# Patient Record
Sex: Female | Born: 1965 | Race: White | Hispanic: No | Smoking: Current every day smoker
Health system: Southern US, Community
[De-identification: ages and names within clinical notes are randomized; demographics above are authoritative.]

## PROBLEM LIST (undated history)

## (undated) DIAGNOSIS — K259 Gastric ulcer, unspecified as acute or chronic, without hemorrhage or perforation: Secondary | ICD-10-CM

## (undated) DIAGNOSIS — K2970 Gastritis, unspecified, without bleeding: Secondary | ICD-10-CM

## (undated) DIAGNOSIS — F32A Depression, unspecified: Secondary | ICD-10-CM

## (undated) DIAGNOSIS — I10 Essential (primary) hypertension: Secondary | ICD-10-CM

## (undated) DIAGNOSIS — F329 Major depressive disorder, single episode, unspecified: Secondary | ICD-10-CM

## (undated) DIAGNOSIS — F419 Anxiety disorder, unspecified: Secondary | ICD-10-CM

## (undated) HISTORY — PX: CHOLECYSTECTOMY: SHX55

## (undated) HISTORY — PX: HEMORRHOID SURGERY: SHX153

## (undated) HISTORY — PX: ABDOMINAL HYSTERECTOMY: SHX81

---

## 2008-06-24 ENCOUNTER — Emergency Department (HOSPITAL_COMMUNITY): Admission: EM | Admit: 2008-06-24 | Discharge: 2008-06-24 | Payer: Self-pay | Admitting: Emergency Medicine

## 2009-10-19 ENCOUNTER — Emergency Department (HOSPITAL_COMMUNITY): Admission: EM | Admit: 2009-10-19 | Discharge: 2009-10-20 | Payer: Self-pay | Admitting: Emergency Medicine

## 2009-11-23 ENCOUNTER — Ambulatory Visit (HOSPITAL_COMMUNITY): Admission: RE | Admit: 2009-11-23 | Discharge: 2009-11-23 | Payer: Self-pay | Admitting: General Surgery

## 2010-02-04 ENCOUNTER — Emergency Department (HOSPITAL_COMMUNITY): Admission: EM | Admit: 2010-02-04 | Discharge: 2010-02-04 | Payer: Self-pay | Admitting: Emergency Medicine

## 2010-05-15 ENCOUNTER — Other Ambulatory Visit: Payer: Self-pay | Admitting: Emergency Medicine

## 2010-05-16 ENCOUNTER — Ambulatory Visit: Payer: Self-pay | Admitting: Psychiatry

## 2010-05-17 ENCOUNTER — Inpatient Hospital Stay (HOSPITAL_COMMUNITY)
Admission: AD | Admit: 2010-05-17 | Discharge: 2010-05-19 | Payer: Self-pay | Source: Home / Self Care | Admitting: Psychiatry

## 2010-09-10 LAB — DIFFERENTIAL
Basophils Relative: 1 % (ref 0–1)
Eosinophils Absolute: 0.1 10*3/uL (ref 0.0–0.7)
Monocytes Relative: 7 % (ref 3–12)
Neutro Abs: 7.5 10*3/uL (ref 1.7–7.7)
Neutrophils Relative %: 66 % (ref 43–77)

## 2010-09-10 LAB — RAPID URINE DRUG SCREEN, HOSP PERFORMED
Amphetamines: NOT DETECTED
Benzodiazepines: POSITIVE — AB
Tetrahydrocannabinol: NOT DETECTED

## 2010-09-10 LAB — BASIC METABOLIC PANEL
BUN: 10 mg/dL (ref 6–23)
CO2: 26 mEq/L (ref 19–32)
Calcium: 9.2 mg/dL (ref 8.4–10.5)
Chloride: 106 mEq/L (ref 96–112)
GFR calc Af Amer: >60 mL/min (ref >60)

## 2010-09-10 LAB — ETHANOL: Alcohol, Ethyl (B): <5 mg/dL (ref 0–10)

## 2010-09-10 LAB — CBC
MCH: 31.8 pg (ref 26.0–34.0)
MCHC: 35 g/dL (ref 30.0–36.0)
MCV: 90.9 fL (ref 78.0–100.0)
RDW: 12.8 % (ref 11.5–15.5)
WBC: 11.3 10*3/uL — ABNORMAL HIGH (ref 4.0–10.5)

## 2010-09-16 LAB — COMPREHENSIVE METABOLIC PANEL
ALT: 18 U/L (ref 0–35)
AST: 15 U/L (ref 0–37)
Albumin: 4.1 g/dL (ref 3.5–5.2)
Alkaline Phosphatase: 51 U/L (ref 39–117)
BUN: 7 mg/dL (ref 6–23)
GFR calc non Af Amer: >60 mL/min (ref >60)
Total Bilirubin: 0.5 mg/dL (ref 0.3–1.2)
Total Protein: 7.2 g/dL (ref 6.0–8.3)

## 2010-09-16 LAB — CBC
Hemoglobin: 14.2 g/dL (ref 12.0–15.0)
MCV: 93.4 fL (ref 78.0–100.0)

## 2010-09-16 LAB — DIFFERENTIAL
Basophils Absolute: 0.1 10*3/uL (ref 0.0–0.1)
Monocytes Relative: 8 % (ref 3–12)
Neutro Abs: 5.9 10*3/uL (ref 1.7–7.7)
Neutrophils Relative %: 55 % (ref 43–77)

## 2012-08-01 ENCOUNTER — Encounter (HOSPITAL_COMMUNITY): Payer: Self-pay | Admitting: *Deleted

## 2012-08-01 ENCOUNTER — Emergency Department (HOSPITAL_COMMUNITY)
Admission: EM | Admit: 2012-08-01 | Discharge: 2012-08-01 | Disposition: A | Discharge status: Home / Self Care | Payer: Self-pay | Attending: Emergency Medicine | Admitting: Emergency Medicine

## 2012-08-01 DIAGNOSIS — Z79899 Other long term (current) drug therapy: Secondary | ICD-10-CM | POA: Insufficient documentation

## 2012-08-01 DIAGNOSIS — M549 Dorsalgia, unspecified: Secondary | ICD-10-CM | POA: Insufficient documentation

## 2012-08-01 DIAGNOSIS — F3289 Other specified depressive episodes: Secondary | ICD-10-CM | POA: Insufficient documentation

## 2012-08-01 DIAGNOSIS — I10 Essential (primary) hypertension: Secondary | ICD-10-CM | POA: Insufficient documentation

## 2012-08-01 DIAGNOSIS — R52 Pain, unspecified: Secondary | ICD-10-CM | POA: Insufficient documentation

## 2012-08-01 DIAGNOSIS — F329 Major depressive disorder, single episode, unspecified: Secondary | ICD-10-CM | POA: Insufficient documentation

## 2012-08-01 DIAGNOSIS — F17200 Nicotine dependence, unspecified, uncomplicated: Secondary | ICD-10-CM | POA: Insufficient documentation

## 2012-08-01 HISTORY — DX: Essential (primary) hypertension: I10

## 2012-08-01 HISTORY — DX: Depression, unspecified: F32.A

## 2012-08-01 HISTORY — DX: Major depressive disorder, single episode, unspecified: F32.9

## 2012-08-01 LAB — URINALYSIS, ROUTINE W REFLEX MICROSCOPIC
Glucose, UA: NEGATIVE mg/dL
Nitrite: NEGATIVE
Specific Gravity, Urine: 1.015 (ref 1.005–1.030)
pH: 8 (ref 5.0–8.0)

## 2012-08-01 LAB — URINE MICROSCOPIC-ADD ON

## 2012-08-01 MED ORDER — KETOROLAC TROMETHAMINE 60 MG/2ML IM SOLN
60.0000 mg | Freq: Once | INTRAMUSCULAR | Status: AC
  Administered 2012-08-01: 60 mg via INTRAMUSCULAR
  Filled 2012-08-01: qty 2

## 2012-08-01 NOTE — ED Notes (Signed)
Pt reports woke tonight w/ back pain across the middle of her back. Pt denies any N/V/D or running any fevers.

## 2012-08-01 NOTE — ED Provider Notes (Addendum)
History     CSN: [id number]  Arrival date & time 08/01/12  0035   None     Chief Complaint  Patient presents with  . Back Pain  . Generalized Body Aches    (Consider location/radiation/quality/duration/timing/severity/associated sxs/prior treatment) HPI Marie Diaz is a 47 y.o. female who presents to the Emergency Department complaining of middle back pain that woke her from sleep. She has taken no medicines.   Past Medical History  Diagnosis Date  . Hypertension   . Depression     Past Surgical History  Procedure Date  . Abdominal hysterectomy   . Hemorrhoid surgery     No family history on file.  History  Substance Use Topics  . Smoking status: Current Every Day Smoker    Types: Cigarettes  . Smokeless tobacco: Not on file  . Alcohol Use: No    OB History    Grav Para Term Preterm Abortions TAB SAB Ect Mult Living                  Review of Systems  Constitutional: Negative for fever.       10 Systems reviewed and are negative for acute change except as noted in the HPI.  HENT: Negative for congestion.   Eyes: Negative for discharge and redness.  Respiratory: Negative for cough and shortness of breath.   Cardiovascular: Negative for chest pain.  Gastrointestinal: Negative for vomiting and abdominal pain.  Musculoskeletal: Positive for back pain.  Skin: Negative for rash.  Neurological: Negative for syncope, numbness and headaches.  Psychiatric/Behavioral:       No behavior change.    Allergies  Ultram  Home Medications   Current Outpatient Rx  Name  Route  Sig  Dispense  Refill  . ALPRAZOLAM 1 MG PO TABS   Oral   Take 1 mg by mouth at bedtime as needed.         Marland Kitchen VALSARTAN-HYDROCHLOROTHIAZIDE 160-12.5 MG PO TABS   Oral   Take 1 tablet by mouth daily.         . VENLAFAXINE HCL 50 MG PO TABS   Oral   Take 50 mg by mouth daily.           BP 184/104  Pulse 80  Temp 98.3 F (36.8 C) (Oral)  Resp 20  Ht 5\' 2"  (1.575 m)  Wt  120 lb (54.432 kg)  BMI 21.95 kg/m2  SpO2 98%  Physical Exam  Nursing note and vitals reviewed. Constitutional: She appears well-developed and well-nourished.  Awake, alert, nontoxic appearance.  HENT:  Head: Atraumatic.  Eyes: Right eye exhibits no discharge. Left eye exhibits no discharge.  Neck: Neck supple.  Cardiovascular: Normal rate.   Pulmonary/Chest: Effort normal and breath sounds normal. She exhibits no tenderness.  Abdominal: Soft. Bowel sounds are normal. There is no tenderness. There is no rebound.  Musculoskeletal: She exhibits no tenderness.  Baseline ROM, no obvious new focal weakness.No spinal tenderness to percussion. Paraspinal tenderness to palaption.  Neurological:  Mental status and motor strength appears baseline for patient and situation.  Skin: No rash noted.  Psychiatric: She has a normal mood and affect.    ED Course  Procedures (including critical care time)  Results for orders placed during the hospital encounter of 08/01/12  URINALYSIS, ROUTINE W REFLEX MICROSCOPIC      Component Value Range   Color, Urine YELLOW  YELLOW   APPearance CLEAR  CLEAR   Specific Gravity, Urine 1.015  1.005 -  1.030   pH 8.0  5.0 - 8.0   Glucose, UA NEGATIVE  NEGATIVE mg/dL   Hgb urine dipstick SMALL (*) NEGATIVE   Bilirubin Urine NEGATIVE  NEGATIVE   Ketones, ur NEGATIVE  NEGATIVE mg/dL   Protein, ur NEGATIVE  NEGATIVE mg/dL   Urobilinogen, UA 0.2  0.0 - 1.0 mg/dL   Nitrite NEGATIVE  NEGATIVE   Leukocytes, UA NEGATIVE  NEGATIVE  URINE MICROSCOPIC-ADD ON      Component Value Range   Squamous Epithelial / LPF RARE  RARE   WBC, UA 0-2  <3 WBC/hpf   RBC / HPF 3-6  <3 RBC/hpf   Bacteria, UA FEW (*) RARE      MDM  Patient presents with back pain that woke her up from sleep. Given toradol. Pain relieved. Labs unremarkable. Pt stable in ED with no significant deterioration in condition.The patient appears reasonably screened and/or stabilized for discharge and I  doubt any other medical condition or other [facility] requiring further screening, evaluation, or treatment in the ED at this time prior to discharge. MDM Reviewed: nursing note and vitals Interpretation: labs           Nicoletta Dress. Colon Branch, MD 08/01/12 1610  Nicoletta Dress. Colon Branch, MD 08/19/12 [phone]

## 2012-08-01 NOTE — Discharge Instructions (Signed)
Apply heat to help with your back pain. Use ibuprofen or tylenol for your back.     Back Injury Prevention The following tips can help you to prevent a back injury. PHYSICAL FITNESS  Exercise often. Try to develop strong stomach (abdominal) muscles.  Do aerobic exercises often. This includes walking, jogging, biking, swimming.  Do exercises that help with balance and strength often. This includes tai chi and yoga.  Stretch before and after you exercise.  Keep a healthy weight. DIET   Ask your doctor how much calcium and vitamin D you need every day.  Include calcium in your diet. Foods high in calcium include dairy products; green, leafy vegetables; and products with calcium added (fortified).  Include vitamin D in your diet. Foods high in vitamin D include milk and products with vitamin D added.  Think about taking a multivitamin or other nutritional products called " supplements."  Stop smoking if you smoke. POSTURE   Sit and stand up straight. Avoid leaning forward or hunching over.  Choose chairs that support your lower back.  If you work at a desk:  Sit close to your work so you do not lean over.  Keep your chin tucked in.  Keep your neck drawn back.  Keep your elbows bent at a right angle. Your arms should look like the letter "L."  Sit high and close to the steering wheel when you drive. Add low back support to your car seat if needed.  Avoid sitting or standing in one position for too long. Get up and move around every hour. Take breaks if you are driving for a long time.  Sleep on your side with your knees slightly bent. You can also sleep on your back with a pillow under your knees. Do not sleep on your stomach. LIFTING, TWISTING, AND REACHING  Avoid heavy lifting, especially lifting over and over again. If you must do heavy lifting:  Stretch before lifting.  Work slowly.  Rest between lifts.  Use carts and dollies to move objects when  possible.  Make several small trips instead of carrying 1 heavy load.  Ask for help when you need it.  Ask for help when moving big, awkward objects.  Follow these steps when lifting:  Stand with your feet shoulder-width apart.  Get as close to the object as you can. Do not pick up heavy objects that are far from your body.  Use handles or lifting straps when possible.  Bend at your knees. Squat down, but keep your heels off the floor.  Keep your shoulders back, your chin tucked in, and your back straight.  Lift the object slowly. Tighten the muscles in your legs, stomach, and butt. Keep the object as close to the center of your body as possible.  Reverse these directions when you put a load down.  Do not:  Lift the object above your waist.  Twist at the waist while lifting or carrying a load. Move your feet if you need to turn, not your waist.  Bend over without bending at your knees.  Avoid reaching over your head, across a table, or for an object on a high surface. OTHER TIPS  Avoid wet floors and keep sidewalks clear of ice.  Do not sleep on a mattress that is too soft or too hard.  Keep items that you use often within easy reach.  Put heavier objects on shelves at waist level. Put lighter objects on lower or higher shelves.  Find ways  to lessen your stress. You can try exercise, massage, or relaxation.  Get help for depression or anxiety if needed. GET HELP IF:  You injure your back.  You have questions about diet, exercise, or other ways to prevent back injuries. MAKE SURE YOU:  Understand these instructions.  Will watch your condition.  Will get help right away if you are not doing well or get worse. Document Released: 12/03/2007 Document Revised: 09/08/2011 Document Reviewed: 07/28/2011 [facility] Patient Information 2013 [place], [place].

## 2012-08-01 NOTE — ED Notes (Signed)
Pt alert & oriented x4, stable gait. Patient given discharge instructions, paperwork & prescription(s). Patient  instructed to stop at the registration desk to finish any additional paperwork. Patient verbalized understanding. Pt left department w/ no further questions. 

## 2014-03-17 ENCOUNTER — Emergency Department (HOSPITAL_COMMUNITY)
Admission: EM | Admit: 2014-03-17 | Discharge: 2014-03-18 | Disposition: A | Discharge status: Home / Self Care | Payer: Self-pay | Attending: Emergency Medicine | Admitting: Emergency Medicine

## 2014-03-17 ENCOUNTER — Emergency Department (HOSPITAL_COMMUNITY): Payer: Self-pay

## 2014-03-17 ENCOUNTER — Encounter (HOSPITAL_COMMUNITY): Payer: Self-pay | Admitting: Emergency Medicine

## 2014-03-17 DIAGNOSIS — F17200 Nicotine dependence, unspecified, uncomplicated: Secondary | ICD-10-CM | POA: Insufficient documentation

## 2014-03-17 DIAGNOSIS — I10 Essential (primary) hypertension: Secondary | ICD-10-CM | POA: Insufficient documentation

## 2014-03-17 DIAGNOSIS — Z79899 Other long term (current) drug therapy: Secondary | ICD-10-CM | POA: Insufficient documentation

## 2014-03-17 DIAGNOSIS — J440 Chronic obstructive pulmonary disease with acute lower respiratory infection: Secondary | ICD-10-CM | POA: Insufficient documentation

## 2014-03-17 DIAGNOSIS — R059 Cough, unspecified: Secondary | ICD-10-CM | POA: Insufficient documentation

## 2014-03-17 DIAGNOSIS — F329 Major depressive disorder, single episode, unspecified: Secondary | ICD-10-CM | POA: Insufficient documentation

## 2014-03-17 DIAGNOSIS — J441 Chronic obstructive pulmonary disease with (acute) exacerbation: Secondary | ICD-10-CM

## 2014-03-17 DIAGNOSIS — J209 Acute bronchitis, unspecified: Secondary | ICD-10-CM | POA: Insufficient documentation

## 2014-03-17 DIAGNOSIS — R05 Cough: Secondary | ICD-10-CM | POA: Insufficient documentation

## 2014-03-17 DIAGNOSIS — J40 Bronchitis, not specified as acute or chronic: Secondary | ICD-10-CM

## 2014-03-17 DIAGNOSIS — F3289 Other specified depressive episodes: Secondary | ICD-10-CM | POA: Insufficient documentation

## 2014-03-17 MED ORDER — PREDNISONE 50 MG PO TABS
60.0000 mg | ORAL_TABLET | Freq: Once | ORAL | Status: AC
  Administered 2014-03-17: 60 mg via ORAL
  Filled 2014-03-17 (×2): qty 1

## 2014-03-17 MED ORDER — ALBUTEROL SULFATE (2.5 MG/3ML) 0.083% IN NEBU
5.0000 mg | INHALATION_SOLUTION | Freq: Once | RESPIRATORY_TRACT | Status: AC
  Administered 2014-03-17: 5 mg via RESPIRATORY_TRACT
  Filled 2014-03-17: qty 6

## 2014-03-17 MED ORDER — IPRATROPIUM BROMIDE 0.02 % IN SOLN
0.5000 mg | Freq: Once | RESPIRATORY_TRACT | Status: AC
Start: 2014-03-17 — End: 2014-03-17
  Administered 2014-03-17: 0.5 mg via RESPIRATORY_TRACT
  Filled 2014-03-17: qty 2.5

## 2014-03-17 MED ORDER — IPRATROPIUM BROMIDE 0.02 % IN SOLN
0.5000 mg | Freq: Once | RESPIRATORY_TRACT | Status: AC
  Administered 2014-03-17: 0.5 mg via RESPIRATORY_TRACT
  Filled 2014-03-17: qty 2.5

## 2014-03-17 MED ORDER — LEVOFLOXACIN 500 MG PO TABS
500.0000 mg | ORAL_TABLET | Freq: Once | ORAL | Status: AC
  Administered 2014-03-17: 500 mg via ORAL
  Filled 2014-03-17: qty 1

## 2014-03-17 NOTE — ED Notes (Signed)
6 day history of chest congestion, cough, wheezing.  Today had slight nausea.  She has taken 2 bottles of robitussin throughout week and has taken 8 Amoxicillin that her husband had left over.  Denies fever, chills, dizziness.

## 2014-03-17 NOTE — ED Provider Notes (Signed)
CSN: [id number]     Arrival date & time 03/17/14  2147 History   First MD Initiated Contact with Patient 03/17/14 2154   This chart was scribed for Lyanne Co, MD by Gwenevere Abbot, ED scribe. This patient was seen in room APA03/APA03 and the patient's care was started at 10:19 PM.    Chief Complaint  Patient presents with  . Cough  . Chest Pain   The history is provided by the patient. No language interpreter was used.   HPI Comments:  Marie Diaz is a 48 y.o. female who presents to the Emergency Department complaining of chest congestion, with associated symptoms of cough. Pt reports that she is a smoker. Symptoms are mild. No SOB.  No history of DVT or pulmonary wasn't.  No unilateral leg swelling.  No recent travel or surgery.  Past Medical History  Diagnosis Date  . Hypertension   . Depression    Past Surgical History  Procedure Laterality Date  . Abdominal hysterectomy    . Hemorrhoid surgery     History reviewed. No pertinent family history. History  Substance Use Topics  . Smoking status: Current Every Day Smoker -- 1.00 packs/day    Types: Cigarettes  . Smokeless tobacco: Not on file  . Alcohol Use: No   OB History   Grav Para Term Preterm Abortions TAB SAB Ect Mult Living                 Review of Systems  HENT: Positive for congestion.   Respiratory: Positive for cough.   All other systems reviewed and are negative.     Allergies  Ultram  Home Medications   Prior to Admission medications   Medication Sig Start Date End Date Taking? Authorizing Provider  ALPRAZolam Prudy Feeler) 1 MG tablet Take 1 mg by mouth at bedtime as needed.   Yes Historical Provider, MD  amLODipine (NORVASC) 10 MG tablet Take 10 mg by mouth daily.   Yes Historical Provider, MD  Ascorbic Acid (VITAMIN C PO) Take 1 tablet by mouth daily.   Yes Historical Provider, MD  BIOTIN PO Take 1 tablet by mouth daily.   Yes Historical Provider, MD  losartan (COZAAR) 50 MG tablet Take 50 mg  by mouth daily.   Yes Historical Provider, MD  Potassium (POTASSIMIN PO) Take 1 tablet by mouth daily.   Yes Historical Provider, MD  venlafaxine XR (EFFEXOR-XR) 75 MG 24 hr capsule Take 75 mg by mouth daily with breakfast.   Yes Historical Provider, MD                 BP 192/98  Pulse 100  Temp(Src) 97.8 F (36.6 C) (Oral)  Resp 20  Ht  (1.626 m)  Wt 120 lb (54.432 kg)  BMI 20.59 kg/m2  SpO2 96% Physical Exam  Nursing note and vitals reviewed. Constitutional: She is oriented to person, place, and time. She appears well-developed and well-nourished. No distress.  HENT:  Head: Normocephalic and atraumatic.  Eyes: EOM are normal.  Neck: Normal range of motion.  Cardiovascular: Normal rate, regular rhythm and normal heart sounds.   Pulmonary/Chest: She has wheezes (Bilaterally). She has rhonchi.  Abdominal: Soft. She exhibits no distension. There is no tenderness.  Musculoskeletal: Normal range of motion.  Neurological: She is alert and oriented to person, place, and time.  Skin: Skin is warm and dry.  Psychiatric: She has a normal mood and affect. Judgment normal.    ED Course  Procedures  DIAGNOSTIC STUDIES: Oxygen Saturation is 99% on RA, normal by my interpretation.  COORDINATION OF CARE:   Labs Review Labs Reviewed - No data to display  Imaging Review Dg Chest 2 View  03/17/2014   CLINICAL DATA:  Productive cough, at anterior chest pain for 6 days.  EXAM: CHEST  2 VIEW  COMPARISON:  Chest radiograph Nov 22, 2009  FINDINGS: Cardiomediastinal silhouette is unremarkable. Similarly increased lung volumes with flattened hemidiaphragms. The lungs are clear without pleural effusions or focal consolidations. Trachea projects midline and there is no pneumothorax. Soft tissue planes and included osseous structures are non-suspicious.  IMPRESSION: Increased lung volumes suggest COPD without superimposed acute cardiopulmonary process.   Electronically Signed   By: Awilda Metro   On: 03/17/2014 22:45     EKG Interpretation None      MDM   Final diagnoses:  COPD exacerbation  Bronchitis    suspect bronchitis.  Overall well-appearing.  Discharge home in good condition.  Chest x-ray without pneumonia.  Improvement in her cough with albuterol  I personally performed the services described in this documentation, which was scribed in my presence. The recorded information has been reviewed and is accurate.      Lyanne Co, MD 03/19/14 [phone]

## 2014-03-18 MED ORDER — PREDNISONE 10 MG PO TABS: 60.0000 mg | ORAL_TABLET | Freq: Every day | ORAL | Status: DC

## 2014-03-18 MED ORDER — ALBUTEROL SULFATE HFA 108 (90 BASE) MCG/ACT IN AERS: 2.0000 | INHALATION_SPRAY | RESPIRATORY_TRACT | Status: DC | PRN

## 2014-03-18 MED ORDER — LEVOFLOXACIN 500 MG PO TABS: 500.0000 mg | ORAL_TABLET | Freq: Every day | ORAL | Status: DC

## 2014-03-18 MED ORDER — ALBUTEROL SULFATE HFA 108 (90 BASE) MCG/ACT IN AERS: 2.0000 | INHALATION_SPRAY | RESPIRATORY_TRACT | Status: DC

## 2014-03-18 MED ORDER — ALBUTEROL SULFATE HFA 108 (90 BASE) MCG/ACT IN AERS
INHALATION_SPRAY | RESPIRATORY_TRACT | Status: AC
  Filled 2014-03-18: qty 6.7

## 2014-03-18 NOTE — ED Notes (Signed)
Pt verbalizes understanding of discharge instructions and directions on follow-up care. Pt voiced no concerns and all questions were answered. Pt ambulatory and off unit at this time.

## 2014-03-18 NOTE — Discharge Instructions (Signed)

## 2015-11-07 ENCOUNTER — Emergency Department (HOSPITAL_COMMUNITY)
Admission: EM | Admit: 2015-11-07 | Discharge: 2015-11-08 | Disposition: A | Discharge status: Home / Self Care | Payer: Self-pay | Attending: Emergency Medicine | Admitting: Emergency Medicine

## 2015-11-07 ENCOUNTER — Encounter (HOSPITAL_COMMUNITY): Payer: Self-pay

## 2015-11-07 ENCOUNTER — Emergency Department (HOSPITAL_COMMUNITY): Payer: Self-pay

## 2015-11-07 DIAGNOSIS — R112 Nausea with vomiting, unspecified: Secondary | ICD-10-CM | POA: Insufficient documentation

## 2015-11-07 DIAGNOSIS — F329 Major depressive disorder, single episode, unspecified: Secondary | ICD-10-CM | POA: Insufficient documentation

## 2015-11-07 DIAGNOSIS — F17210 Nicotine dependence, cigarettes, uncomplicated: Secondary | ICD-10-CM | POA: Insufficient documentation

## 2015-11-07 DIAGNOSIS — R079 Chest pain, unspecified: Secondary | ICD-10-CM

## 2015-11-07 DIAGNOSIS — Z79899 Other long term (current) drug therapy: Secondary | ICD-10-CM | POA: Insufficient documentation

## 2015-11-07 DIAGNOSIS — I10 Essential (primary) hypertension: Secondary | ICD-10-CM | POA: Insufficient documentation

## 2015-11-07 DIAGNOSIS — R072 Precordial pain: Secondary | ICD-10-CM | POA: Insufficient documentation

## 2015-11-07 LAB — CBC WITH DIFFERENTIAL/PLATELET
BASOS ABS: 0 10*3/uL (ref 0.0–0.1)
Basophils Relative: 1 %
EOS ABS: 0.1 10*3/uL (ref 0.0–0.7)
EOS PCT: 1 %
HCT: 44.2 % (ref 36.0–46.0)
Hemoglobin: 15.4 g/dL — ABNORMAL HIGH (ref 12.0–15.0)
LYMPHS PCT: 20 %
Lymphs Abs: 1.7 10*3/uL (ref 0.7–4.0)
MCH: 31.6 pg (ref 26.0–34.0)
MCHC: 34.8 g/dL (ref 30.0–36.0)
MCV: 90.6 fL (ref 78.0–100.0)
MONO ABS: 0.6 10*3/uL (ref 0.1–1.0)
Monocytes Relative: 7 %
Neutro Abs: 6.2 10*3/uL (ref 1.7–7.7)
Neutrophils Relative %: 71 %
PLATELETS: 203 10*3/uL (ref 150–400)
RBC: 4.88 MIL/uL (ref 3.87–5.11)
RDW: 12.5 % (ref 11.5–15.5)
WBC: 8.5 10*3/uL (ref 4.0–10.5)

## 2015-11-07 LAB — COMPREHENSIVE METABOLIC PANEL
ALT: 10 U/L — ABNORMAL LOW (ref 14–54)
AST: 15 U/L (ref 15–41)
Albumin: 4.3 g/dL (ref 3.5–5.0)
Alkaline Phosphatase: 54 U/L (ref 38–126)
Anion gap: 8 (ref 5–15)
BUN: 10 mg/dL (ref 6–20)
CHLORIDE: 107 mmol/L (ref 101–111)
CO2: 22 mmol/L (ref 22–32)
Calcium: 8.5 mg/dL — ABNORMAL LOW (ref 8.9–10.3)
Creatinine, Ser: 0.58 mg/dL (ref 0.44–1.00)
Glucose, Bld: 119 mg/dL — ABNORMAL HIGH (ref 65–99)
POTASSIUM: 3.3 mmol/L — AB (ref 3.5–5.1)
Sodium: 137 mmol/L (ref 135–145)
Total Bilirubin: 0.6 mg/dL (ref 0.3–1.2)
Total Protein: 7.1 g/dL (ref 6.5–8.1)

## 2015-11-07 LAB — URINALYSIS, ROUTINE W REFLEX MICROSCOPIC
Bilirubin Urine: NEGATIVE
Glucose, UA: NEGATIVE mg/dL
Ketones, ur: NEGATIVE mg/dL
LEUKOCYTES UA: NEGATIVE
Nitrite: NEGATIVE
PROTEIN: 30 mg/dL — AB
Specific Gravity, Urine: 1.01 (ref 1.005–1.030)
pH: 6 (ref 5.0–8.0)

## 2015-11-07 LAB — URINE MICROSCOPIC-ADD ON

## 2015-11-07 LAB — TROPONIN I

## 2015-11-07 NOTE — ED Notes (Signed)
Pt assisted back from bathroom and back to bed, pt placed on monitor; urine specimen obtained and sent to lab

## 2015-11-07 NOTE — ED Provider Notes (Signed)
CSN: [id number]     Arrival date & time 11/07/15  2006 History   First MD Initiated Contact with Patient 11/07/15 2014     Chief Complaint  Patient presents with  . Chest Pain   PT SAID THAT SHE FELT HOT, HAD CHEST PRESSURE AND STARTED VOMITING ABOUT 1 HR PTA.  THE PT SAID HER BP WAS VERY HIGH.  THE PT SAID THAT SHE TOOK A CLONIDINE AND CALLED EMS.  THE PT FEELS FINE NOW.  BP IS OK.  PT HAS NEVER HAD ANYTHING LIKE THIS IN THE PAST.  (Consider location/radiation/quality/duration/timing/severity/associated sxs/prior Treatment) Patient is a 50 y.o. female presenting with chest pain. The history is provided by the patient.  Chest Pain Pain location:  Substernal area Pain quality: pressure   Pain radiates to the back: no   Pain severity:  Moderate Onset quality:  Sudden Progression:  Resolved Chronicity:  New Associated symptoms: nausea     Past Medical History  Diagnosis Date  . Hypertension   . Depression    Past Surgical History  Procedure Laterality Date  . Abdominal hysterectomy    . Hemorrhoid surgery     No family history on file. Social History  Substance Use Topics  . Smoking status: Current Every Day Smoker -- 1.00 packs/day    Types: Cigarettes  . Smokeless tobacco: None  . Alcohol Use: No   OB History    No data available     Review of Systems  Cardiovascular: Positive for chest pain.  Gastrointestinal: Positive for nausea.  All other systems reviewed and are negative.     Allergies  Tetracyclines & related and Ultram  Home Medications   Prior to Admission medications   Medication Sig Start Date End Date Taking? Authorizing Provider  ALPRAZolam Prudy Feeler) 1 MG tablet Take 2 mg by mouth at bedtime.    Yes Historical Provider, MD  amLODipine (NORVASC) 10 MG tablet Take 10 mg by mouth daily.   Yes Historical Provider, MD  BIOTIN PO Take 1 tablet by mouth daily.   Yes Historical Provider, MD  cloNIDine (CATAPRES) 0.1 MG tablet Take 0.1 mg by mouth 2 (two)  times daily.   Yes Historical Provider, MD  vitamin C (ASCORBIC ACID) 500 MG tablet Take 500 mg by mouth daily.   Yes Historical Provider, MD   BP 181/111 mmHg  Pulse 75  Temp(Src) 97.5 F (36.4 C) (Oral)  Resp 12  Ht  (1.575 m)  Wt 107 lb (48.535 kg)  BMI 19.57 kg/m2  SpO2 95% Physical Exam  Constitutional: She is oriented to person, place, and time. She appears well-developed and well-nourished.  HENT:  Head: Normocephalic and atraumatic.  Right Ear: External ear normal.  Left Ear: External ear normal.  Mouth/Throat: Oropharynx is clear and moist.  Eyes: Conjunctivae are normal. Pupils are equal, round, and reactive to light.  Neck: Normal range of motion. Neck supple.  Cardiovascular: Normal rate, regular rhythm, normal heart sounds and intact distal pulses.   Pulmonary/Chest: Effort normal and breath sounds normal.  Abdominal: Soft. Bowel sounds are normal.  Musculoskeletal: Normal range of motion.  Neurological: She is alert and oriented to person, place, and time.  Skin: Skin is warm and dry.  Psychiatric: She has a normal mood and affect. Her behavior is normal. Judgment and thought content normal.  Nursing note and vitals reviewed.   ED Course  Procedures (including critical care time) Labs Review Labs Reviewed  CBC WITH DIFFERENTIAL/PLATELET - Abnormal; Notable for the  following:    Hemoglobin 15.4 (*)    All other components within normal limits  COMPREHENSIVE METABOLIC PANEL - Abnormal; Notable for the following:    Potassium 3.3 (*)    Glucose, Bld 119 (*)    Calcium 8.5 (*)    ALT 10 (*)    All other components within normal limits  URINALYSIS, ROUTINE W REFLEX MICROSCOPIC (NOT AT [facility]) - Abnormal; Notable for the following:    Hgb urine dipstick SMALL (*)    Protein, ur 30 (*)    All other components within normal limits  URINE MICROSCOPIC-ADD ON - Abnormal; Notable for the following:    Squamous Epithelial / LPF 0-5 (*)    Bacteria, UA RARE (*)     All other components within normal limits  TROPONIN I    Imaging Review Dg Chest 2 View  11/07/2015  CLINICAL DATA:  50 year old female with chest pain EXAM: CHEST  2 VIEW COMPARISON:  Chest CT dated 10/08/2015 FINDINGS: The heart size and mediastinal contours are within normal limits. Both lungs are clear. The visualized skeletal structures are unremarkable. IMPRESSION: No active cardiopulmonary disease. Electronically Signed   By: Elgie CollardArash  Radparvar M.D.   On: 11/07/2015 21:28   I have personally reviewed and evaluated these images and lab results as part of my medical decision-making.   EKG Interpretation   Date/Time:  Wednesday Nov 07 2015 20:17:31 EDT Ventricular Rate:  62 PR Interval:  125 QRS Duration: 99 QT Interval:  464 QTC Calculation: 471 R Axis:   73 Text Interpretation:  Sinus rhythm Confirmed by Liliahna Cudd MD, Pascale Maves ([id number])  on 11/07/2015 9:18:27 PM      MDM  PT IS SLEEPING COMFORTABLY.  SHE HAS HAD NO ADDITIONAL EPISODES WHILE HERE.  PT INSTR TO F/U WITH PCP REGARDING HER BP.  RETURN IF WORSE.  TAKE ROUTINE EVENING BP MEDS WHEN YOU ARRIVE HOME. Final diagnoses:  Chest pain, unspecified chest pain type  Essential hypertension        Jacalyn LefevreJulie Murlean Seelye, MD 11/08/15 0004

## 2015-11-07 NOTE — ED Notes (Signed)
Pt states at approx 1 hour ago she had an episode where she suddenly felt hot, had some chest pressure and started vomiting.  Pt denies pain at this time.

## 2015-11-08 NOTE — ED Notes (Signed)
Pt requesting something for her BP and states she is still having chest pain and "I just don't feel right"; EDP made aware and she states pt has been instructed to take her BP med when she gets home; informed pt of EDP's decision; pt taken out by wheelchair

## 2016-03-17 ENCOUNTER — Encounter (HOSPITAL_COMMUNITY): Payer: Self-pay | Admitting: Emergency Medicine

## 2016-03-17 ENCOUNTER — Emergency Department (HOSPITAL_COMMUNITY)
Admission: EM | Admit: 2016-03-17 | Discharge: 2016-03-17 | Disposition: A | Discharge status: Home / Self Care | Payer: Self-pay | Attending: Emergency Medicine | Admitting: Emergency Medicine

## 2016-03-17 DIAGNOSIS — Z7982 Long term (current) use of aspirin: Secondary | ICD-10-CM | POA: Insufficient documentation

## 2016-03-17 DIAGNOSIS — N3091 Cystitis, unspecified with hematuria: Secondary | ICD-10-CM | POA: Insufficient documentation

## 2016-03-17 DIAGNOSIS — I10 Essential (primary) hypertension: Secondary | ICD-10-CM | POA: Insufficient documentation

## 2016-03-17 DIAGNOSIS — Z79899 Other long term (current) drug therapy: Secondary | ICD-10-CM | POA: Insufficient documentation

## 2016-03-17 DIAGNOSIS — R319 Hematuria, unspecified: Secondary | ICD-10-CM

## 2016-03-17 DIAGNOSIS — F17210 Nicotine dependence, cigarettes, uncomplicated: Secondary | ICD-10-CM | POA: Insufficient documentation

## 2016-03-17 DIAGNOSIS — N3090 Cystitis, unspecified without hematuria: Secondary | ICD-10-CM

## 2016-03-17 LAB — URINE MICROSCOPIC-ADD ON

## 2016-03-17 LAB — URINALYSIS, ROUTINE W REFLEX MICROSCOPIC
Bilirubin Urine: NEGATIVE
GLUCOSE, UA: NEGATIVE mg/dL
KETONES UR: NEGATIVE mg/dL
NITRITE: NEGATIVE
PH: 6.5 (ref 5.0–8.0)
Protein, ur: 100 mg/dL — AB
Specific Gravity, Urine: 1.015 (ref 1.005–1.030)

## 2016-03-17 MED ORDER — CIPROFLOXACIN HCL 500 MG PO TABS
500.0000 mg | ORAL_TABLET | Freq: Two times a day (BID) | ORAL | 0 refills | Status: DC
Start: 2016-03-17 — End: 2017-07-25

## 2016-03-17 MED ORDER — CEPHALEXIN 500 MG PO CAPS: 500.0000 mg | ORAL_CAPSULE | Freq: Once | ORAL | Status: DC

## 2016-03-17 MED ORDER — PHENAZOPYRIDINE HCL 200 MG PO TABS: 200.0000 mg | ORAL_TABLET | Freq: Three times a day (TID) | ORAL | 0 refills | Status: DC | PRN

## 2016-03-17 MED ORDER — CIPROFLOXACIN HCL 250 MG PO TABS
500.0000 mg | ORAL_TABLET | Freq: Once | ORAL | Status: AC
  Administered 2016-03-17: 500 mg via ORAL
  Filled 2016-03-17: qty 2

## 2016-03-17 NOTE — ED Triage Notes (Signed)
Pt reports hematuria, pelvic pressure, and urinary frequency that began this morning.

## 2016-03-17 NOTE — ED Provider Notes (Signed)
[facility] Provider Note   CSN: [id number] Arrival date & time: 03/17/16  1201     History   Chief Complaint Chief Complaint  Patient presents with  . Hematuria    HPI Marie Diaz is a 50 y.o. female.  Patient complains of pain with urination.   The history is provided by the patient. No language interpreter was used.  Hematuria  This is a new problem. The current episode started 12 to 24 hours ago. The problem occurs constantly. The problem has not changed since onset.Associated symptoms include abdominal pain. Pertinent negatives include no chest pain and no headaches. Nothing aggravates the symptoms. Nothing relieves the symptoms. She has tried nothing for the symptoms. The treatment provided no relief.    Past Medical History:  Diagnosis Date  . Depression   . Hypertension     There are no active problems to display for this patient.   Past Surgical History:  Procedure Laterality Date  . ABDOMINAL HYSTERECTOMY    . HEMORRHOID SURGERY      OB History    No data available       Home Medications    Prior to Admission medications   Medication Sig Start Date End Date Taking? Authorizing Provider  ALPRAZolam Prudy Feeler) 1 MG tablet Take 2 mg by mouth at bedtime.    Yes Historical Provider, MD  amLODipine (NORVASC) 10 MG tablet Take 10 mg by mouth daily.   Yes Historical Provider, MD  amoxicillin (AMOXIL) 875 MG tablet Take 875 mg by mouth 2 (two) times daily.   Yes Historical Provider, MD  aspirin EC 81 MG tablet Take 81 mg by mouth daily.   Yes Historical Provider, MD  BIOTIN PO Take 1 tablet by mouth daily.   Yes Historical Provider, MD  cloNIDine (CATAPRES) 0.1 MG tablet Take 0.1 mg by mouth 2 (two) times daily.   Yes Historical Provider, MD  venlafaxine XR (EFFEXOR-XR) 150 MG 24 hr capsule Take 150 mg by mouth daily with breakfast.   Yes Historical Provider, MD  vitamin C (ASCORBIC ACID) 500 MG tablet Take 500 mg by mouth daily.   Yes Historical  Provider, MD  ciprofloxacin (CIPRO) 500 MG tablet Take 1 tablet (500 mg total) by mouth 2 (two) times daily. One po bid x 7 days 03/17/16   Bethann Berkshire, MD  phenazopyridine (PYRIDIUM) 200 MG tablet Take 1 tablet (200 mg total) by mouth 3 (three) times daily as needed for pain. 03/17/16   Bethann Berkshire, MD    Family History No family history on file.  Social History Social History  Substance Use Topics  . Smoking status: Current Every Day Smoker    Packs/day: 1.00    Types: Cigarettes  . Smokeless tobacco: Never Used  . Alcohol use No     Allergies   Tetracyclines & related and Ultram [tramadol]   Review of Systems Review of Systems  Constitutional: Negative for appetite change and fatigue.  HENT: Negative for congestion, ear discharge and sinus pressure.   Eyes: Negative for discharge.  Respiratory: Negative for cough.   Cardiovascular: Negative for chest pain.  Gastrointestinal: Positive for abdominal pain. Negative for diarrhea.  Genitourinary: Positive for hematuria. Negative for frequency.  Musculoskeletal: Negative for back pain.  Skin: Negative for rash.  Neurological: Negative for seizures and headaches.  Psychiatric/Behavioral: Negative for hallucinations.     Physical Exam Updated Vital Signs BP 110/70 (BP Location: Right Arm)   Pulse 72   Temp 97.7 F (36.5  C)   Resp 18   Ht 5\' 2"  (1.575 m)   Wt 100 lb (45.4 kg)   SpO2 96%   BMI 18.29 kg/m   Physical Exam  Constitutional: She is oriented to person, place, and time. She appears well-developed.  HENT:  Head: Normocephalic.  Eyes: Conjunctivae and EOM are normal. No scleral icterus.  Neck: Neck supple. No thyromegaly present.  Cardiovascular: Normal rate and regular rhythm.  Exam reveals no gallop and no friction rub.   No murmur heard. Pulmonary/Chest: No stridor. She has no wheezes. She has no rales. She exhibits no tenderness.  Abdominal: She exhibits no distension. There is tenderness. There is  no rebound.  Mild tender suprapubic  Musculoskeletal: Normal range of motion. She exhibits no edema.  Lymphadenopathy:    She has no cervical adenopathy.  Neurological: She is oriented to person, place, and time. She exhibits normal muscle tone. Coordination normal.  Skin: No rash noted. No erythema.  Psychiatric: She has a normal mood and affect. Her behavior is normal.     ED Treatments / Results  Labs (all labs ordered are listed, but only abnormal results are displayed) Labs Reviewed  URINALYSIS, ROUTINE W REFLEX MICROSCOPIC (NOT AT [facility]) - Abnormal; Notable for the following:       Result Value   Color, Urine AMBER (*)    APPearance HAZY (*)    Hgb urine dipstick LARGE (*)    Protein, ur 100 (*)    Leukocytes, UA LARGE (*)    All other components within normal limits  URINE MICROSCOPIC-ADD ON - Abnormal; Notable for the following:    Squamous Epithelial / LPF 0-5 (*)    Bacteria, UA FEW (*)    All other components within normal limits  URINE CULTURE    EKG  EKG Interpretation None       Radiology No results found.  Procedures Procedures (including critical care time)  Medications Ordered in ED Medications  ciprofloxacin (CIPRO) tablet 500 mg (500 mg Oral Given 03/17/16 1750)     Initial Impression / Assessment and Plan / ED Course  I have reviewed the triage vital signs and the nursing notes.  Pertinent labs & imaging results that were available during my care of the patient were reviewed by me and considered in my medical decision making (see chart for details).  Clinical Course    Patient with UTI will be treated with Cipro.  Final Clinical Impressions(s) / ED Diagnoses   Final diagnoses:  Cystitis  Hematuria    New Prescriptions New Prescriptions   CIPROFLOXACIN (CIPRO) 500 MG TABLET    Take 1 tablet (500 mg total) by mouth 2 (two) times daily. One po bid x 7 days   PHENAZOPYRIDINE (PYRIDIUM) 200 MG TABLET    Take 1 tablet (200 mg total) by  mouth 3 (three) times daily as needed for pain.     Bethann BerkshireJoseph Cobain Morici, MD 03/17/16 [phone]

## 2016-03-17 NOTE — Discharge Instructions (Signed)
Follow-up with your family doctor in one week

## 2016-03-20 LAB — URINE CULTURE: Culture: 40000 — AB

## 2016-03-21 ENCOUNTER — Telehealth (HOSPITAL_BASED_OUTPATIENT_CLINIC_OR_DEPARTMENT_OTHER): Payer: Self-pay

## 2016-03-21 NOTE — Telephone Encounter (Signed)
Post ED Visit - Positive Culture Follow-up  Culture report reviewed by antimicrobial stewardship pharmacist:  []  Enzo BiNathan Batchelder, Pharm.D. []  Celedonio MiyamotoJeremy Frens, Pharm.D., BCPS []  Garvin FilaMike Maccia, Pharm.D. []  Georgina PillionElizabeth Martin, Pharm.D., BCPS []  PioneerMinh Pham, [address]Pharm[address]D., BCPS, AAHIVP []  Estella HuskMichelle Turner, Pharm.D., BCPS, AAHIVP []  Tennis Mustassie Stewart, Pharm.D. []  Sherle Poeob Vincent, [address]Ph[address]Bailey Mech. Emily Stewart Pharm D Positive urine culture Treated with Ciprofloxacin HCL, organism sensitive to the same and no further patient follow-up is required at this time.  Jerry CarasCullom, Annisha Baar Burnett 03/21/2016, 9:01 AM

## 2017-07-25 ENCOUNTER — Emergency Department (HOSPITAL_COMMUNITY): Payer: Self-pay

## 2017-07-25 ENCOUNTER — Emergency Department (HOSPITAL_COMMUNITY)
Admission: EM | Admit: 2017-07-25 | Discharge: 2017-07-25 | Disposition: A | Discharge status: Home / Self Care | Payer: Self-pay | Attending: Emergency Medicine | Admitting: Emergency Medicine

## 2017-07-25 ENCOUNTER — Encounter (HOSPITAL_COMMUNITY): Payer: Self-pay | Admitting: Emergency Medicine

## 2017-07-25 DIAGNOSIS — F32A Depression, unspecified: Secondary | ICD-10-CM

## 2017-07-25 DIAGNOSIS — Z79899 Other long term (current) drug therapy: Secondary | ICD-10-CM | POA: Insufficient documentation

## 2017-07-25 DIAGNOSIS — I10 Essential (primary) hypertension: Secondary | ICD-10-CM | POA: Insufficient documentation

## 2017-07-25 DIAGNOSIS — Z7982 Long term (current) use of aspirin: Secondary | ICD-10-CM | POA: Insufficient documentation

## 2017-07-25 DIAGNOSIS — F17210 Nicotine dependence, cigarettes, uncomplicated: Secondary | ICD-10-CM | POA: Insufficient documentation

## 2017-07-25 DIAGNOSIS — F329 Major depressive disorder, single episode, unspecified: Secondary | ICD-10-CM | POA: Insufficient documentation

## 2017-07-25 LAB — CBC WITH DIFFERENTIAL/PLATELET
Basophils Absolute: 0 10*3/uL (ref 0.0–0.1)
Basophils Relative: 0 %
EOS ABS: 0.1 10*3/uL (ref 0.0–0.7)
Eosinophils Relative: 1 %
HEMATOCRIT: 44.7 % (ref 36.0–46.0)
HEMOGLOBIN: 15.3 g/dL — AB (ref 12.0–15.0)
LYMPHS ABS: 2.1 10*3/uL (ref 0.7–4.0)
LYMPHS PCT: 26 %
MCH: 31.3 pg (ref 26.0–34.0)
MCHC: 34.2 g/dL (ref 30.0–36.0)
MCV: 91.4 fL (ref 78.0–100.0)
Monocytes Absolute: 0.5 10*3/uL (ref 0.1–1.0)
Monocytes Relative: 6 %
NEUTROS PCT: 67 %
Neutro Abs: 5.3 10*3/uL (ref 1.7–7.7)
Platelets: 231 10*3/uL (ref 150–400)
RBC: 4.89 MIL/uL (ref 3.87–5.11)
RDW: 12.3 % (ref 11.5–15.5)
WBC: 8 10*3/uL (ref 4.0–10.5)

## 2017-07-25 LAB — RAPID URINE DRUG SCREEN, HOSP PERFORMED
AMPHETAMINES: NOT DETECTED
Barbiturates: NOT DETECTED
Benzodiazepines: POSITIVE — AB
COCAINE: NOT DETECTED
OPIATES: NOT DETECTED
TETRAHYDROCANNABINOL: NOT DETECTED

## 2017-07-25 LAB — URINALYSIS, ROUTINE W REFLEX MICROSCOPIC
Bilirubin Urine: NEGATIVE
Glucose, UA: NEGATIVE mg/dL
Ketones, ur: NEGATIVE mg/dL
Leukocytes, UA: NEGATIVE
Nitrite: NEGATIVE
PROTEIN: NEGATIVE mg/dL
RBC / HPF: NONE SEEN RBC/hpf (ref 0–5)
SPECIFIC GRAVITY, URINE: 1.002 — AB (ref 1.005–1.030)
pH: 7 (ref 5.0–8.0)

## 2017-07-25 LAB — COMPREHENSIVE METABOLIC PANEL
ALBUMIN: 4.6 g/dL (ref 3.5–5.0)
ALK PHOS: 58 U/L (ref 38–126)
ALT: 9 U/L — ABNORMAL LOW (ref 14–54)
ANION GAP: 13 (ref 5–15)
AST: 16 U/L (ref 15–41)
BUN: 10 mg/dL (ref 6–20)
CALCIUM: 9.5 mg/dL (ref 8.9–10.3)
CHLORIDE: 107 mmol/L (ref 101–111)
CO2: 21 mmol/L — AB (ref 22–32)
Creatinine, Ser: 0.71 mg/dL (ref 0.44–1.00)
GFR calc Af Amer: >60 mL/min (ref >60)
GFR calc non Af Amer: >60 mL/min (ref >60)
GLUCOSE: 102 mg/dL — AB (ref 65–99)
Potassium: 3.4 mmol/L — ABNORMAL LOW (ref 3.5–5.1)
SODIUM: 141 mmol/L (ref 135–145)
Total Bilirubin: 0.5 mg/dL (ref 0.3–1.2)
Total Protein: 7.6 g/dL (ref 6.5–8.1)

## 2017-07-25 LAB — LIPASE, BLOOD: Lipase: 30 U/L (ref 11–51)

## 2017-07-25 MED ORDER — VENLAFAXINE HCL ER 37.5 MG PO CP24: 37.5000 mg | ORAL_CAPSULE | Freq: Every day | ORAL | 0 refills | Status: DC

## 2017-07-25 MED ORDER — PROMETHAZINE HCL 25 MG PO TABS: 25.0000 mg | ORAL_TABLET | Freq: Four times a day (QID) | ORAL | 0 refills | Status: DC | PRN

## 2017-07-25 MED ORDER — HYDROXYZINE HCL 50 MG/ML IM SOLN
25.0000 mg | Freq: Once | INTRAMUSCULAR | Status: AC
  Administered 2017-07-25: 25 mg via INTRAMUSCULAR
  Filled 2017-07-25: qty 1

## 2017-07-25 MED ORDER — HYDROXYZINE HCL 25 MG PO TABS: 25.0000 mg | ORAL_TABLET | Freq: Four times a day (QID) | ORAL | 0 refills | Status: DC

## 2017-07-25 MED ORDER — POTASSIUM CHLORIDE CRYS ER 20 MEQ PO TBCR
40.0000 meq | EXTENDED_RELEASE_TABLET | Freq: Once | ORAL | Status: AC
  Administered 2017-07-25: 40 meq via ORAL
  Filled 2017-07-25: qty 2

## 2017-07-25 MED ORDER — VENLAFAXINE HCL 25 MG PO TABS
25.0000 mg | ORAL_TABLET | Freq: Once | ORAL | Status: AC
  Administered 2017-07-25: 25 mg via ORAL
  Filled 2017-07-25 (×2): qty 1

## 2017-07-25 NOTE — BH Assessment (Addendum)
Tele Assessment Note   Patient Name: Marie Diaz MRN: [id number] Referring Physician: Clarene Duke Location of Patient:  Location of Provider: Behavioral Health TTS Department  Marie Diaz is an 52 y.o. female who [facility] at [facility] accompanied by her mother reporting symptoms of depression and anxiety due to recent demands of caring for her GM who was diagnosed with dementia and has deteriorated rapidly. She states that's he has had to care for her for the past two weeks "all the time" in addition to caring for her disabled husband, and going between the two houses is too much for her. She states that she has been vomiting and losing sleep with racing thoughts, but denies SI, HI, AVH, SA. She states that she was being treated for depression by her PCP, but weaned herself off of her Effexor 1 year ago, and can't get in to see her PCP until Feb 5th because her MD is out of town.  PT denies homicidal ideation/ history of violence. Pt denies auditory or visual hallucinations or other psychotic symptoms. Pt states current stressors include caregiving.  Pt lives with her husband and 52 yo son, and supports include some family and her mom. Pt denies history of abuse and trauma. Pt reports no family history of MH issues or SA. Pt states that she had to quit work 5 years ago to help her husband who is disabled. Pt has fair insight and judgment. Pt's memory is typical. Pt denies legal history. ? Pt's OP history includes no previous MH treatment other than OP med mgt by PCP. Pt denies previous IP history. Pt denies alcohol/ substance abuse. ? MSE: Pt is casually dressed, alert, oriented x4 with normal speech and normal motor behavior. Eye contact is good. Pt's mood is depressed and affect is depressed and blunted. Affect is congruent with mood. Thought process is coherent and relevant. There is no indication pt is currently responding to internal stimuli or experiencing delusional thought  content. Pt was cooperative throughout assessment. Pt is able to contract for safety outside the hospital and does not want inpatient psychiatric treatment.  Inetta Fermo, FNP, recommends DC with OP resources. Spoke with EDP Dr. Clayborne Dana who agrees with disposition. Spoke with RN re: discharge instructions recommending OP resources in [place].    Diagnosis: Depression, Anxiety  Past Medical History:  Past Medical History:  Diagnosis Date  . Depression   . Hypertension     Past Surgical History:  Procedure Laterality Date  . ABDOMINAL HYSTERECTOMY    . CHOLECYSTECTOMY    . HEMORRHOID SURGERY      Family History: History reviewed. No pertinent family history.  Social History:  reports that she has been smoking cigarettes.  She has been smoking about 1.00 pack per day. she has never used smokeless tobacco. She reports that she does not drink alcohol or use drugs.  Additional Social History:  Alcohol / Drug Use Pain Medications: denies Prescriptions: denies Over the Counter: denies History of alcohol / drug use?: No history of alcohol / drug abuse Longest period of sobriety (when/how long): denies  CIWA: CIWA-Ar BP: 119/77 Pulse Rate: 76 COWS:    Allergies:  Allergies  Allergen Reactions  . Tetracyclines & Related   . Ultram [Tramadol] Other (See Comments)    "knocks me out"    Home Medications:  (Not in a hospital admission)  OB/GYN Status:  No LMP recorded. Patient has had a hysterectomy.  General Assessment Data Location of Assessment: [facility] TTS Assessment:  In system Is this a Tele or Face-to-Face Assessment?: Tele Assessment Is this an Initial Assessment or a Re-assessment for this encounter?: Initial Assessment Marital status: Married Is patient pregnant?: No Pregnancy Status: No Living Arrangements: Spouse/significant other, Children Can pt return to current living arrangement?: Yes Admission Status: Voluntary Is patient capable of signing voluntary  admission?: Yes Referral Source: Self/Family/Friend Insurance type: SP     Crisis Care Plan Living Arrangements: Spouse/significant other, Children Legal Guardian: (self) Name of Psychiatrist: (none) Name of Therapist: none  Education Status Is patient currently in school?: No  Risk to self with the past 6 months Suicidal Ideation: No Has patient been a risk to self within the past 6 months prior to admission? : No Suicidal Intent: No Has patient had any suicidal intent within the past 6 months prior to admission? : No Is patient at risk for suicide?: No Suicidal Plan?: No Has patient had any suicidal plan within the past 6 months prior to admission? : No Access to Means: No What has been your use of drugs/alcohol within the last 12 months?: denies Previous Attempts/Gestures: No Other Self Harm Risks: (none known) Intentional Self Injurious Behavior: None Family Suicide History: No Recent stressful life event(s): Turmoil (Comment)(caregiving for GM, husband) Persecutory voices/beliefs?: No Depression: Yes Depression Symptoms: Insomnia, Fatigue, Loss of interest in usual pleasures, Tearfulness Substance abuse history and/or treatment for substance abuse?: No Suicide prevention information given to non-admitted patients: Not applicable  Risk to Others within the past 6 months Homicidal Ideation: No Does patient have any lifetime risk of violence toward others beyond the six months prior to admission? : No Thoughts of Harm to Others: No Current Homicidal Intent: No Current Homicidal Plan: No Access to Homicidal Means: No History of harm to others?: No Assessment of Violence: None Noted Does patient have access to weapons?: (guns for protection) Criminal Charges Pending?: No Does patient have a court date: No Is patient on probation?: No  Psychosis Hallucinations: None noted Delusions: None noted  Mental Status Report Appearance/Hygiene: Unremarkable Eye Contact:  Good Motor Activity: Unremarkable Speech: Logical/coherent Level of Consciousness: Alert Mood: Depressed, Anxious, Sad Affect: Depressed, Anxious, Sad Anxiety Level: Severe Thought Processes: Coherent, Relevant Judgement: Unimpaired Orientation: Person, Place, Time, Situation, Appropriate for developmental age Obsessive Compulsive Thoughts/Behaviors: None  Cognitive Functioning Concentration: Poor Memory: Recent Intact, Remote Intact IQ: Average Insight: Fair Impulse Control: Fair Appetite: Poor Weight Loss: (unk) Weight Gain: (unk) Sleep: Decreased Total Hours of Sleep: 3 Vegetative Symptoms: None  ADLScreening [facility] Assessment Services) Patient's cognitive ability adequate to safely complete daily activities?: Yes Patient able to express need for assistance with ADLs?: Yes Independently performs ADLs?: Yes (appropriate for developmental age)  Prior Inpatient Therapy Prior Inpatient Therapy: No  Prior Outpatient Therapy Prior Outpatient Therapy: Yes(med mgt from PCP) Prior Therapy Dates: (ongoing) Prior Therapy Facilty/Provider(s): (PCP) Reason for Treatment: (depression) Does patient have an ACCT team?: No Does patient have Intensive In-House Services?  : No Does patient have [facility] services? : No Does patient have P4CC services?: No  ADL Screening (condition at time of admission) Patient's cognitive ability adequate to safely complete daily activities?: Yes Is the patient deaf or have difficulty hearing?: No Does the patient have difficulty seeing, even when wearing glasses/contacts?: No Does the patient have difficulty concentrating, remembering, or making decisions?: No Patient able to express need for assistance with ADLs?: Yes Does the patient have difficulty dressing or bathing?: No Independently performs ADLs?: Yes (appropriate for developmental age) Does the patient have  difficulty walking or climbing stairs?: No Weakness of Legs: None Weakness of  Arms/Hands: None  Home Assistive Devices/Equipment Home Assistive Devices/Equipment: None  Therapy Consults (therapy consults require a physician order) PT Evaluation Needed: No OT Evalulation Needed: No SLP Evaluation Needed: No Abuse/Neglect Assessment (Assessment to be complete while patient is alone) Abuse/Neglect Assessment Can Be Completed: Yes Physical Abuse: Denies Verbal Abuse: Denies Sexual Abuse: Denies Exploitation of patient/patient's resources: Denies Self-Neglect: Denies Values / Beliefs Cultural Requests During Hospitalization: None Spiritual Requests During Hospitalization: None Consults Spiritual Care Consult Needed: No Social Work Consult Needed: No Merchant navy officer (For Healthcare) Does Patient Have a Medical Advance Directive?: No Would patient like information on creating a medical advance directive?: No - Patient declined    Additional Information 1:1 In Past 12 Months?: No CIRT Risk: No Elopement Risk: No Does patient have medical clearance?: Yes     Disposition:  Disposition Initial Assessment Completed for this Encounter: Yes Disposition of Patient: Discharge with Outpatient Resources  This service was provided via telemedicine using a 2-way, interactive audio and video technology.  Names of all persons participating in this telemedicine service and their role in this encounter.    Name: Barrington Ellison Role: TTS counselor          [facility] 07/25/2017 4:19 PM

## 2017-07-25 NOTE — ED Notes (Signed)
TTS in progress 

## 2017-07-25 NOTE — ED Provider Notes (Signed)
5:24 PM Assumed care from Dr. Clarene DukeMcManus, please see their note for full history, physical and decision making until this point. In brief this is a 52 y.o. year old female who presented to the ED tonight with V70.1     Patient not suicidal homicidal but is very stressful situation with depression comes here for help.  Initially it was a little hypertensive with some nausea and suffered a seems to have improved as she is got more comfortable.  Plan is to wait for TTS for recommendations.  TTS has recommended restarting Lexapro and Vistaril if comfortable.  I discussed this with the patient who agrees.  Mom is with her.  They will also try to find some coping strategies at home with a situation or more family members.  Discharge instructions, including strict return precautions for new or worsening symptoms, given. Patient and/or family verbalized understanding and agreement with the plan as described.   Labs, studies and imaging reviewed by myself and considered in medical decision making if ordered. Imaging interpreted by radiology.  Labs Reviewed  COMPREHENSIVE METABOLIC PANEL - Abnormal; Notable for the following components:      Result Value   Potassium 3.4 (*)    CO2 21 (*)    Glucose, Bld 102 (*)    ALT 9 (*)    All other components within normal limits  CBC WITH DIFFERENTIAL/PLATELET - Abnormal; Notable for the following components:   Hemoglobin 15.3 (*)    All other components within normal limits  URINALYSIS, ROUTINE W REFLEX MICROSCOPIC - Abnormal; Notable for the following components:   Color, Urine STRAW (*)    Specific Gravity, Urine 1.002 (*)    Hgb urine dipstick SMALL (*)    Bacteria, UA FEW (*)    Squamous Epithelial / LPF 0-5 (*)    All other components within normal limits  RAPID URINE DRUG SCREEN, HOSP PERFORMED - Abnormal; Notable for the following components:   Benzodiazepines POSITIVE (*)    All other components within normal limits  LIPASE, BLOOD    DG Abd Acute  W/Chest  Final Result      No Follow-up on file.    Marily MemosMesner, Uldine Fuster, MD 07/25/17 [phone]

## 2017-07-25 NOTE — ED Provider Notes (Signed)
[facility], [place] EMERGENCY DEPARTMENT Provider Note   CSN: [id number] Arrival date & time: 07/25/17  1329     History   Chief Complaint Chief Complaint  Patient presents with  . V70.1    HPI Marie Diaz is a 52 y.o. female.  HPI  Pt was seen at 1355. Per pt, c/o gradual onset and worsening of persistent depression over the past 2 weeks. Pt states she has been the sole caregiver for her grandmother who was dx with dementia. Pt states she is "overwhelmed" and feeling depressed. Pt states she "needs someone to tell her what to do." Pt has been having dry heaves and decreased PO intake for the past 4 days due to her feeling depressed and overwhelmed. Denies SA, no SI, no HI, no hallucinations.   Past Medical History:  Diagnosis Date  . Depression   . Hypertension     There are no active problems to display for this patient.   Past Surgical History:  Procedure Laterality Date  . ABDOMINAL HYSTERECTOMY    . CHOLECYSTECTOMY    . HEMORRHOID SURGERY      OB History    No data available       Home Medications    Prior to Admission medications   Medication Sig Start Date End Date Taking? Authorizing Provider  ALPRAZolam Prudy Feeler) 1 MG tablet Take 2 mg by mouth at bedtime.     [provider]  amLODipine (NORVASC) 10 MG tablet Take 10 mg by mouth daily.    [provider]  amoxicillin (AMOXIL) 875 MG tablet Take 875 mg by mouth 2 (two) times daily.    [provider]  aspirin EC 81 MG tablet Take 81 mg by mouth daily.    [provider]  BIOTIN PO Take 1 tablet by mouth daily.    [provider]  ciprofloxacin (CIPRO) 500 MG tablet Take 1 tablet (500 mg total) by mouth 2 (two) times daily. One po bid x 7 days 03/17/16   Bethann Berkshire, MD  cloNIDine (CATAPRES) 0.1 MG tablet Take 0.1 mg by mouth 2 (two) times daily.    [provider]  phenazopyridine (PYRIDIUM) 200 MG tablet Take 1 tablet (200 mg total) by mouth 3 (three)  times daily as needed for pain. 03/17/16   Bethann Berkshire, MD  venlafaxine XR (EFFEXOR-XR) 150 MG 24 hr capsule Take 150 mg by mouth daily with breakfast.    [provider]  vitamin C (ASCORBIC ACID) 500 MG tablet Take 500 mg by mouth daily.    [provider]    Family History History reviewed. No pertinent family history.  Social History Social History   Tobacco Use  . Smoking status: Current Every Day Smoker    Packs/day: 1.00    Types: Cigarettes  . Smokeless tobacco: Never Used  Substance Use Topics  . Alcohol use: No  . Drug use: No     Allergies   Tetracyclines & related and Ultram [tramadol]   Review of Systems Review of Systems ROS: Statement: All systems negative except as marked or noted in the HPI; Constitutional: Negative for fever and chills. ; ; Eyes: Negative for eye pain, redness and discharge. ; ; ENMT: Negative for ear pain, hoarseness, nasal congestion, sinus pressure and sore throat. ; ; Cardiovascular: Negative for chest pain, palpitations, diaphoresis, dyspnea and peripheral edema. ; ; Respiratory: Negative for cough, wheezing and stridor. ; ; Gastrointestinal: +N/V. Negative for diarrhea, abdominal pain, blood in stool, hematemesis,  jaundice and rectal bleeding. . ; ; Genitourinary: Negative for dysuria, flank pain and hematuria. ; ; Musculoskeletal: Negative for back pain and neck pain. Negative for swelling and trauma.; ; Skin: Negative for pruritus, rash, abrasions, blisters, bruising and skin lesion.; ; Neuro: Negative for headache, lightheadedness and neck stiffness. Negative for weakness, altered level of consciousness, altered mental status, extremity weakness, paresthesias, involuntary movement, seizure and syncope.; Psych:  +depression. No SI, no SA, no HI, no hallucinations.     Physical Exam Updated Vital Signs BP (!) 158/104   Pulse (!) 111   Temp 98.4 F (36.9 C) (Oral)   Resp 20   Ht 5\' 2"  (1.575 m)   Wt 51.3 kg (113 lb)    SpO2 99%   BMI 20.67 kg/m    BP (!) 147/89   Pulse 85   Temp 98.4 F (36.9 C) (Oral)   Resp 16   Ht 5\' 2"  (1.575 m)   Wt 51.3 kg (113 lb)   SpO2 99%   BMI 20.67 kg/m    Physical Exam 1400: Physical examination:  Nursing notes reviewed; Vital signs and O2 SAT reviewed;  Constitutional: Well developed, Well nourished, Well hydrated, Anxious, crying.; Head:  Normocephalic, atraumatic; Eyes: EOMI, PERRL, No scleral icterus; ENMT: Mouth and pharynx normal, Mucous membranes moist; Neck: Supple, Full range of motion, No lymphadenopathy; Cardiovascular: Regular rate and rhythm, No gallop; Respiratory: Breath sounds clear & equal bilaterally, No wheezes.  Speaking full sentences with ease, Normal respiratory effort/excursion; Chest: Nontender, Movement normal; Abdomen: Soft, Nontender, Nondistended, Normal bowel sounds; Genitourinary: No CVA tenderness; Extremities: Pulses normal, No tenderness, No edema, No calf edema or asymmetry.; Neuro: AA&Ox3, Major CN grossly intact. Speech clear. No gross focal motor or sensory deficits in extremities.; Skin: Color normal, Warm, Dry.; Psych:  Anxious, crying.    ED Treatments / Results  Labs (all labs ordered are listed, but only abnormal results are displayed)   EKG  EKG Interpretation None       Radiology   Procedures Procedures (including critical care time)  Medications Ordered in ED Medications  hydrOXYzine (VISTARIL) injection 25 mg (not administered)     Initial Impression / Assessment and Plan / ED Course  I have reviewed the triage vital signs and the nursing notes.  Pertinent labs & imaging results that were available during my care of the patient were reviewed by me and considered in my medical decision making (see chart for details).  MDM Reviewed: previous chart, nursing note and vitals Reviewed previous: labs Interpretation: labs and x-ray   Results for orders placed or performed during the hospital encounter of  07/25/17  Comprehensive metabolic panel  Result Value Ref Range   Sodium 141 135 - 145 mmol/L   Potassium 3.4 (L) 3.5 - 5.1 mmol/L   Chloride 107 101 - 111 mmol/L   CO2 21 (L) 22 - 32 mmol/L   Glucose, Bld 102 (H) 65 - 99 mg/dL   BUN 10 6 - 20 mg/dL   Creatinine, Ser 1.61 0.44 - 1.00 mg/dL   Calcium 9.5 8.9 - 09.6 mg/dL   Total Protein 7.6 6.5 - 8.1 g/dL   Albumin 4.6 3.5 - 5.0 g/dL   AST 16 15 - 41 U/L   ALT 9 (L) 14 - 54 U/L   Alkaline Phosphatase 58 38 - 126 U/L   Total Bilirubin 0.5 0.3 - 1.2 mg/dL   GFR calc non Af Amer >60 >60 mL/min   GFR calc Af Amer >60 >60  mL/min   Anion gap 13 5 - 15  Lipase, blood  Result Value Ref Range   Lipase 30 11 - 51 U/L  CBC with Differential  Result Value Ref Range   WBC 8.0 4.0 - 10.5 K/uL   RBC 4.89 3.87 - 5.11 MIL/uL   Hemoglobin 15.3 (H) 12.0 - 15.0 g/dL   HCT 82.944.7 56.236.0 - 13.046.0 %   MCV 91.4 78.0 - 100.0 fL   MCH 31.3 26.0 - 34.0 pg   MCHC 34.2 30.0 - 36.0 g/dL   RDW 86.512.3 78.411.5 - 69.615.5 %   Platelets 231 150 - 400 K/uL   Neutrophils Relative % 67 %   Neutro Abs 5.3 1.7 - 7.7 K/uL   Lymphocytes Relative 26 %   Lymphs Abs 2.1 0.7 - 4.0 K/uL   Monocytes Relative 6 %   Monocytes Absolute 0.5 0.1 - 1.0 K/uL   Eosinophils Relative 1 %   Eosinophils Absolute 0.1 0.0 - 0.7 K/uL   Basophils Relative 0 %   Basophils Absolute 0.0 0.0 - 0.1 K/uL  Urinalysis, Routine w reflex microscopic  Result Value Ref Range   Color, Urine STRAW (A) YELLOW   APPearance CLEAR CLEAR   Specific Gravity, Urine 1.002 (L) 1.005 - 1.030   pH 7.0 5.0 - 8.0   Glucose, UA NEGATIVE NEGATIVE mg/dL   Hgb urine dipstick SMALL (A) NEGATIVE   Bilirubin Urine NEGATIVE NEGATIVE   Ketones, ur NEGATIVE NEGATIVE mg/dL   Protein, ur NEGATIVE NEGATIVE mg/dL   Nitrite NEGATIVE NEGATIVE   Leukocytes, UA NEGATIVE NEGATIVE   RBC / HPF NONE SEEN 0 - 5 RBC/hpf   WBC, UA 0-5 0 - 5 WBC/hpf   Bacteria, UA FEW (A) NONE SEEN   Squamous Epithelial / LPF 0-5 (A) NONE SEEN  Urine  rapid drug screen (hosp performed)  Result Value Ref Range   Opiates NONE DETECTED NONE DETECTED   Cocaine NONE DETECTED NONE DETECTED   Benzodiazepines POSITIVE (A) NONE DETECTED   Amphetamines NONE DETECTED NONE DETECTED   Tetrahydrocannabinol NONE DETECTED NONE DETECTED   Barbiturates NONE DETECTED NONE DETECTED   Dg Abd Acute W/chest Result Date: 07/25/2017 CLINICAL DATA:  Nausea and vomiting EXAM: DG ABDOMEN ACUTE W/ 1V CHEST COMPARISON:  None. FINDINGS: Cardiac shadow is within normal limits. The lungs are well aerated bilaterally. No focal infiltrate or sizable effusion is seen. Scattered large and small bowel gas is noted. No free air is noted. No abnormal mass or abnormal calcifications are seen. Changes of prior cholecystectomy are noted. No acute bony abnormality is noted. IMPRESSION: No acute abnormality seen. Electronically Signed   By: Alcide CleverMark  Lukens M.D.   On: 07/25/2017 14:55    1515:  Pt calmer after IM vistaril. Pt has tol PO well without N/V. Potassium repleted PO. Pt has been admitted psychiatrically for similar symptoms previously in 2011 (at that time she had endorsed SI). Will have TTS evaluate.       Final Clinical Impressions(s) / ED Diagnoses   Final diagnoses:  None    ED Discharge Orders    None       Samuel JesterMcManus, Ezme Duch, DO 07/25/17 1539

## 2017-07-25 NOTE — ED Triage Notes (Signed)
Pt reports her grandmother has dementia and for the past 2 weeks she has had to begin taking care of her parents and it is becoming overwhelming.  States she needs someone to tell her what to do.  Pt crying at triage uncontrollably.  States she has been vomiting and not eating x 4 days.  Pt states she has been having depressed thoughts, but knows she must be here for her family.

## 2017-09-01 ENCOUNTER — Emergency Department (HOSPITAL_COMMUNITY)
Admission: EM | Admit: 2017-09-01 | Discharge: 2017-09-01 | Disposition: A | Discharge status: Home / Self Care | Payer: Self-pay | Attending: Emergency Medicine | Admitting: Emergency Medicine

## 2017-09-01 ENCOUNTER — Encounter (HOSPITAL_COMMUNITY): Payer: Self-pay | Admitting: Emergency Medicine

## 2017-09-01 DIAGNOSIS — Z7982 Long term (current) use of aspirin: Secondary | ICD-10-CM | POA: Insufficient documentation

## 2017-09-01 DIAGNOSIS — I10 Essential (primary) hypertension: Secondary | ICD-10-CM | POA: Insufficient documentation

## 2017-09-01 DIAGNOSIS — R55 Syncope and collapse: Secondary | ICD-10-CM | POA: Insufficient documentation

## 2017-09-01 DIAGNOSIS — F17210 Nicotine dependence, cigarettes, uncomplicated: Secondary | ICD-10-CM | POA: Insufficient documentation

## 2017-09-01 DIAGNOSIS — Z79899 Other long term (current) drug therapy: Secondary | ICD-10-CM | POA: Insufficient documentation

## 2017-09-01 LAB — BASIC METABOLIC PANEL
ANION GAP: 11 (ref 5–15)
BUN: 11 mg/dL (ref 6–20)
CALCIUM: 9.1 mg/dL (ref 8.9–10.3)
CO2: 24 mmol/L (ref 22–32)
CREATININE: 0.67 mg/dL (ref 0.44–1.00)
Chloride: 108 mmol/L (ref 101–111)
GFR calc Af Amer: >60 mL/min (ref >60)
GLUCOSE: 83 mg/dL (ref 65–99)
Potassium: 3.7 mmol/L (ref 3.5–5.1)
Sodium: 143 mmol/L (ref 135–145)

## 2017-09-01 LAB — CBC WITH DIFFERENTIAL/PLATELET
BASOS ABS: 0 10*3/uL (ref 0.0–0.1)
BASOS PCT: 1 %
EOS ABS: 0.1 10*3/uL (ref 0.0–0.7)
EOS PCT: 1 %
HEMATOCRIT: 44.3 % (ref 36.0–46.0)
Hemoglobin: 14.8 g/dL (ref 12.0–15.0)
Lymphocytes Relative: 33 %
Lymphs Abs: 2.2 10*3/uL (ref 0.7–4.0)
MCH: 30.8 pg (ref 26.0–34.0)
MCHC: 33.4 g/dL (ref 30.0–36.0)
MCV: 92.3 fL (ref 78.0–100.0)
MONO ABS: 0.5 10*3/uL (ref 0.1–1.0)
Monocytes Relative: 7 %
NEUTROS ABS: 4 10*3/uL (ref 1.7–7.7)
Neutrophils Relative %: 58 %
PLATELETS: 247 10*3/uL (ref 150–400)
RBC: 4.8 MIL/uL (ref 3.87–5.11)
RDW: 12 % (ref 11.5–15.5)
WBC: 6.8 10*3/uL (ref 4.0–10.5)

## 2017-09-01 LAB — URINALYSIS, ROUTINE W REFLEX MICROSCOPIC
BILIRUBIN URINE: NEGATIVE
GLUCOSE, UA: NEGATIVE mg/dL
Ketones, ur: NEGATIVE mg/dL
LEUKOCYTES UA: NEGATIVE
NITRITE: NEGATIVE
PH: 7 (ref 5.0–8.0)
Protein, ur: NEGATIVE mg/dL
SPECIFIC GRAVITY, URINE: 1.003 — AB (ref 1.005–1.030)

## 2017-09-01 MED ORDER — SODIUM CHLORIDE 0.9 % IV BOLUS (SEPSIS)
1000.0000 mL | Freq: Once | INTRAVENOUS | Status: AC
  Administered 2017-09-01: 1000 mL via INTRAVENOUS

## 2017-09-01 NOTE — ED Notes (Signed)
Pt reports she is under a lot of stress having to take care of her grandmother who has dementia.  Has not been eating or taking care of herself.

## 2017-09-01 NOTE — ED Triage Notes (Signed)
Pt was walking into [employer] and began to feel dizzy and lightheaded.  Was assisted to sit down by someone.

## 2017-09-01 NOTE — Discharge Instructions (Signed)
Your evaluated in the emergency department for a near fainting spell today.  Your blood work and EKG did not show an obvious cause of this.  You definitely sound like you are under a lot of stress and if not had much food intake.  We recommend that you try to eat and drink as regular as possible and follow-up with your doctor.  Please return if any worsening symptoms.

## 2017-09-01 NOTE — ED Provider Notes (Signed)
[facility]GENCY [facility]PART[facility] Provider Note   CSN: [id number] Arrival date & time: 09/01/17  1401     History   Chief Complaint Chief Complaint  Patient presents with  . Near Syncope    HPI Marie Diaz is a 52 y.o. female.  52 year old female with history of hypertension complaining of a presyncopal event while shopping today.  She states she became acutely diaphoretic and weak all over and somebody assisted her to the floor.  She states she could not see anything but could hear the voices the entire time.  Symptoms have improved upon arrival here.  She states she has had a history of these episodes in the past.  She also relates that for 2 years since her gallbladder surgery that she has not had any appetite and has been losing weight.  She is on an appetite stimulant does help with this.  She did not eat at all yesterday or today.  She is under a lot of stress with taking care of her grandmother who has dementia.  She does smoke she denies any alcohol or drugs.  There was no incontinence or tongue biting with this episode.  Nobody reported any seizure activity.  The history is provided by the patient and a friend.  Loss of Consciousness   This is a recurrent problem. The current episode started 1 to 2 hours ago. The problem has been resolved. There was no loss of consciousness. The problem is associated with normal activity. Associated symptoms include diaphoresis, dizziness, light-headedness, malaise/fatigue, vertigo and visual change. Pertinent negatives include abdominal pain, back pain, bladder incontinence, bowel incontinence, chest pain, clumsiness, fever, focal sensory loss, focal weakness, palpitations, seizures, slurred speech and vomiting. She has tried nothing for the symptoms. The treatment provided moderate relief. Her past medical history is significant for HTN and vertigo.    Past Medical History:  Diagnosis Date  . Depression   . Hypertension     There are no  active problems to display for this patient.   Past Surgical History:  Procedure Laterality Date  . ABDOMINAL HYSTERECTOMY    . CHOLECYSTECTOMY    . HEMORRHOID SURGERY      OB History    No data available       Home Medications    Prior to Admission medications   Medication Sig Start Date End Date Taking? Authorizing Provider  ALPRAZolam Prudy Feeler(XANAX) 1 MG tablet Take 2 mg by mouth at bedtime.    Yes [provider]  amLODipine (NORVASC) 5 MG tablet Take 10 mg by mouth daily.   Yes [provider]  aspirin EC 81 MG tablet Take 81 mg by mouth daily.   Yes [provider]  BIOTIN PO Take 1 tablet by mouth daily.   Yes [provider]  cloNIDine (CATAPRES) 0.2 MG tablet Take 0.2 mg by mouth daily.   Yes [provider]  lisinopril (PRINIVIL,ZESTRIL) 10 MG tablet Take 10 mg by mouth daily.   Yes [provider]  promethazine (PHENERGAN) 25 MG tablet Take 1 tablet (25 mg total) by mouth every 6 (six) hours as needed for nausea or vomiting. 07/25/17  Yes Mesner, Barbara CowerJason, MD  vitamin C (ASCORBIC ACID) 500 MG tablet Take 500 mg by mouth daily.   Yes [provider]    Family History History reviewed. No pertinent family history.  Social History Social History   Tobacco Use  . Smoking status: Current Every Day Smoker    Packs/day: 1.00  Types: Cigarettes  . Smokeless tobacco: Never Used  Substance Use Topics  . Alcohol use: No  . Drug use: No     Allergies   Tetracyclines & related and Ultram [tramadol]   Review of Systems Review of Systems  Constitutional: Positive for appetite change, diaphoresis and malaise/fatigue. Negative for chills and fever.  HENT: Negative for ear pain and sore throat.   Eyes: Negative for pain and visual disturbance.  Respiratory: Negative for cough and shortness of breath.   Cardiovascular: Positive for syncope. Negative for chest pain and palpitations.  Gastrointestinal: Negative for  abdominal pain, bowel incontinence and vomiting.  Genitourinary: Negative for bladder incontinence, dysuria and hematuria.  Musculoskeletal: Negative for arthralgias and back pain.  Skin: Negative for color change and rash.  Neurological: Positive for dizziness, vertigo and light-headedness. Negative for focal weakness, seizures and syncope.  All other systems reviewed and are negative.    Physical Exam Updated Vital Signs BP (!) 153/98 (BP Location: Right Arm)   Pulse 79   Temp 99.1 F (37.3 C) (Oral)   Resp 16   Ht 5\' 2"  (1.575 m)   Wt 51.3 kg (113 lb)   SpO2 98%   BMI 20.67 kg/m   Physical Exam  Constitutional: She is oriented to person, place, and time. She appears well-developed and well-nourished. No distress.  HENT:  Head: Normocephalic and atraumatic.  Eyes: Conjunctivae are normal.  Neck: Neck supple.  Cardiovascular: Normal rate and regular rhythm.  No murmur heard. Pulmonary/Chest: Effort normal and breath sounds normal. No respiratory distress.  Abdominal: Soft. There is no tenderness.  Musculoskeletal: She exhibits no edema, tenderness or deformity.  Neurological: She is alert and oriented to person, place, and time. She has normal strength. No cranial nerve deficit or sensory deficit. Gait normal. GCS eye subscore is 4. GCS verbal subscore is 5. GCS motor subscore is 6.  Skin: Skin is warm and dry.  Psychiatric: She has a normal mood and affect.  Nursing note and vitals reviewed.    ED Treatments / Results  Labs (all labs ordered are listed, but only abnormal results are displayed) Labs Reviewed - No data to display  EKG  EKG Interpretation  Date/Time:  Tuesday September 01 2017 14:10:04 EST Ventricular Rate:  82 PR Interval:    QRS Duration: 79 QT Interval:  377 QTC Calculation: 441 R Axis:   73 Text Interpretation:  Sinus rhythm similar to prior 5/17 Confirmed by Meridee Score [phone]) on 09/01/2017 3:02:38 PM       Radiology No results  found.  Procedures Procedures (including critical care time)  Medications Ordered in ED Medications  sodium chloride 0.9 % bolus 1,000 mL (0 mLs Intravenous Stopped 09/01/17 1638)     Initial Impression / Assessment and Plan / ED Course  I have reviewed the triage vital signs and the nursing notes.  Pertinent labs & imaging results that were available during my care of the patient were reviewed by me and considered in my medical decision making (see chart for details).  Clinical Course as of Sep 02 1813  Tue Sep 01, 2017  1714 Reviewed the patient's labs with her.  I did make recommendations that she tried to get more rest and get some food in her.  She needs to follow-up with her primary care doctor to help her.  [MB]    Clinical Course User Index [MB] Terrilee Files, MD    Final Clinical Impressions(s) / ED Diagnoses   Final diagnoses:  Near syncope    ED Discharge Orders    None       Terrilee Files, MD 09/02/17 1815

## 2018-06-26 ENCOUNTER — Emergency Department (HOSPITAL_COMMUNITY)
Admission: EM | Admit: 2018-06-26 | Discharge: 2018-06-26 | Disposition: A | Discharge status: Home / Self Care | Payer: Self-pay | Attending: Emergency Medicine | Admitting: Emergency Medicine

## 2018-06-26 ENCOUNTER — Encounter (HOSPITAL_COMMUNITY): Payer: Self-pay | Admitting: Emergency Medicine

## 2018-06-26 ENCOUNTER — Other Ambulatory Visit: Payer: Self-pay

## 2018-06-26 DIAGNOSIS — F419 Anxiety disorder, unspecified: Secondary | ICD-10-CM | POA: Insufficient documentation

## 2018-06-26 DIAGNOSIS — Z7982 Long term (current) use of aspirin: Secondary | ICD-10-CM | POA: Insufficient documentation

## 2018-06-26 DIAGNOSIS — I160 Hypertensive urgency: Secondary | ICD-10-CM | POA: Insufficient documentation

## 2018-06-26 DIAGNOSIS — Z79899 Other long term (current) drug therapy: Secondary | ICD-10-CM | POA: Insufficient documentation

## 2018-06-26 DIAGNOSIS — F17210 Nicotine dependence, cigarettes, uncomplicated: Secondary | ICD-10-CM | POA: Insufficient documentation

## 2018-06-26 LAB — COMPREHENSIVE METABOLIC PANEL
ALT: 12 U/L (ref 0–44)
AST: 14 U/L — AB (ref 15–41)
Albumin: 4.5 g/dL (ref 3.5–5.0)
Alkaline Phosphatase: 62 U/L (ref 38–126)
Anion gap: 8 (ref 5–15)
BILIRUBIN TOTAL: 0.6 mg/dL (ref 0.3–1.2)
BUN: 11 mg/dL (ref 6–20)
CHLORIDE: 109 mmol/L (ref 98–111)
CO2: 22 mmol/L (ref 22–32)
CREATININE: 0.67 mg/dL (ref 0.44–1.00)
Calcium: 8.8 mg/dL — ABNORMAL LOW (ref 8.9–10.3)
Glucose, Bld: 90 mg/dL (ref 70–99)
Potassium: 3.2 mmol/L — ABNORMAL LOW (ref 3.5–5.1)
Sodium: 139 mmol/L (ref 135–145)
TOTAL PROTEIN: 7.3 g/dL (ref 6.5–8.1)

## 2018-06-26 LAB — CBC WITH DIFFERENTIAL/PLATELET
Abs Immature Granulocytes: 0.03 10*3/uL (ref 0.00–0.07)
BASOS PCT: 1 %
Basophils Absolute: 0.1 10*3/uL (ref 0.0–0.1)
EOS ABS: 0.1 10*3/uL (ref 0.0–0.5)
EOS PCT: 1 %
HCT: 46.1 % — ABNORMAL HIGH (ref 36.0–46.0)
Hemoglobin: 15.7 g/dL — ABNORMAL HIGH (ref 12.0–15.0)
Immature Granulocytes: 0 %
Lymphocytes Relative: 28 %
Lymphs Abs: 2.4 10*3/uL (ref 0.7–4.0)
MCH: 31.5 pg (ref 26.0–34.0)
MCHC: 34.1 g/dL (ref 30.0–36.0)
MCV: 92.4 fL (ref 80.0–100.0)
MONO ABS: 0.6 10*3/uL (ref 0.1–1.0)
Monocytes Relative: 7 %
Neutro Abs: 5.4 10*3/uL (ref 1.7–7.7)
Neutrophils Relative %: 63 %
PLATELETS: 248 10*3/uL (ref 150–400)
RBC: 4.99 MIL/uL (ref 3.87–5.11)
RDW: 11.7 % (ref 11.5–15.5)
WBC: 8.5 10*3/uL (ref 4.0–10.5)
nRBC: 0 % (ref 0.0–0.2)

## 2018-06-26 LAB — URINALYSIS, ROUTINE W REFLEX MICROSCOPIC
BILIRUBIN URINE: NEGATIVE
Glucose, UA: NEGATIVE mg/dL
Ketones, ur: 20 mg/dL — AB
LEUKOCYTES UA: NEGATIVE
Nitrite: NEGATIVE
PH: 6 (ref 5.0–8.0)
Protein, ur: NEGATIVE mg/dL
Specific Gravity, Urine: 1.01 (ref 1.005–1.030)

## 2018-06-26 LAB — LIPASE, BLOOD: LIPASE: 29 U/L (ref 11–51)

## 2018-06-26 LAB — TROPONIN I: Troponin I: <0.03 ng/mL (ref <0.03)

## 2018-06-26 MED ORDER — METOCLOPRAMIDE HCL 10 MG PO TABS: 10.0000 mg | ORAL_TABLET | Freq: Three times a day (TID) | ORAL | 0 refills | Status: DC

## 2018-06-26 MED ORDER — SODIUM CHLORIDE 0.9 % IV BOLUS
500.0000 mL | Freq: Once | INTRAVENOUS | Status: AC
  Administered 2018-06-26: 500 mL via INTRAVENOUS

## 2018-06-26 MED ORDER — LORAZEPAM 2 MG/ML IJ SOLN
0.5000 mg | Freq: Once | INTRAMUSCULAR | Status: AC
  Administered 2018-06-26: 0.5 mg via INTRAVENOUS
  Filled 2018-06-26: qty 1

## 2018-06-26 NOTE — ED Notes (Signed)
Pt aware of need for urine specimen. 

## 2018-06-26 NOTE — Discharge Instructions (Signed)
As discussed, your evaluation today has been largely reassuring.  But, it is important that you monitor your condition carefully, and do not hesitate to return to the ED if you develop new, or concerning changes in your condition. ? ?Otherwise, please follow-up with your physician for appropriate ongoing care. ? ?

## 2018-06-26 NOTE — ED Provider Notes (Signed)
[facility] Provider Note   CSN: [id number] Arrival date & time: 06/26/18  1513     History   Chief Complaint Chief Complaint  Patient presents with  . Hypertension  . Anxiety    HPI Marie Diaz is a 52 y.o. female.  HPI Presents with her mother who assists with the HPI. Patient presents due to concern of nausea, vomiting, p.o. intolerance, and anxiety as well as hypertension. She notes a history of hypertension, states that she takes 3 medications for this. She also takes Xanax, as needed for anxiety. Over the past 2 days the patient has had persistent nausea, vomiting, inability to tolerate food. She notes that she has had substantial life stress, arranging nursing home placement for her grandmother Patient also smokes cigarettes, does not drink alcohol.  Past Medical History:  Diagnosis Date  . Depression   . Hypertension     There are no active problems to display for this patient.   Past Surgical History:  Procedure Laterality Date  . ABDOMINAL HYSTERECTOMY    . CHOLECYSTECTOMY    . HEMORRHOID SURGERY       OB History   No obstetric history on file.      Home Medications    Prior to Admission medications   Medication Sig Start Date End Date Taking? Authorizing Provider  ALPRAZolam Prudy Feeler) 1 MG tablet Take 2 mg by mouth at bedtime.    Yes [provider]  amLODipine (NORVASC) 5 MG tablet Take 10 mg by mouth daily.   Yes [provider]  aspirin EC 81 MG tablet Take 81 mg by mouth daily.   Yes [provider]  cloNIDine (CATAPRES) 0.2 MG tablet Take 0.2 mg by mouth daily.   Yes [provider]  lisinopril (PRINIVIL,ZESTRIL) 10 MG tablet Take 10 mg by mouth daily.   Yes [provider]  Multiple Vitamin (MULTIVITAMIN WITH MINERALS) TABS tablet Take 1 tablet by mouth daily.   Yes [provider]  promethazine (PHENERGAN) 25 MG tablet Take 1 tablet (25 mg total) by mouth every  6 (six) hours as needed for nausea or vomiting. Patient not taking: Reported on 06/26/2018 07/25/17   Mesner, Barbara Cower, MD    Family History No family history on file.  Social History Social History   Tobacco Use  . Smoking status: Current Every Day Smoker    Packs/day: 0.50    Types: Cigarettes  . Smokeless tobacco: Never Used  Substance Use Topics  . Alcohol use: No  . Drug use: No     Allergies   Tetracyclines & related and Ultram [tramadol]   Review of Systems Review of Systems  Constitutional:       Per HPI, otherwise negative  HENT:       Per HPI, otherwise negative  Respiratory:       Per HPI, otherwise negative  Cardiovascular:       Per HPI, otherwise negative  Gastrointestinal: Positive for nausea and vomiting. Negative for abdominal pain.  Endocrine:       Negative aside from HPI  Genitourinary:       Neg aside from HPI   Musculoskeletal:       Per HPI, otherwise negative  Skin: Negative.   Neurological: Negative for syncope.  Psychiatric/Behavioral: Positive for dysphoric mood. The patient is nervous/anxious and is hyperactive.      Physical Exam Updated Vital Signs BP (!) 166/90   Pulse 74   Temp 97.6 F (36.4 C) (  Oral)   Resp 11   Ht 5\' 2"  (1.575 m)   Wt 50.8 kg   SpO2 97%   BMI 20.49 kg/m   Physical Exam Vitals signs and nursing note reviewed.  Constitutional:      General: She is not in acute distress.    Appearance: She is well-developed.  HENT:     Head: Normocephalic and atraumatic.  Eyes:     Conjunctiva/sclera: Conjunctivae normal.  Cardiovascular:     Rate and Rhythm: Normal rate and regular rhythm.  Pulmonary:     Effort: Pulmonary effort is normal. No respiratory distress.     Breath sounds: Normal breath sounds. No stridor.  Abdominal:     General: There is no distension.     Tenderness: There is no abdominal tenderness.  Skin:    General: Skin is warm and dry.  Neurological:     General: No focal deficit present.       Mental Status: She is alert and oriented to person, place, and time.     Cranial Nerves: No cranial nerve deficit.      ED Treatments / Results  Labs (all labs ordered are listed, but only abnormal results are displayed) Labs Reviewed  COMPREHENSIVE METABOLIC PANEL - Abnormal; Notable for the following components:      Result Value   Potassium 3.2 (*)    Calcium 8.8 (*)    AST 14 (*)    All other components within normal limits  CBC WITH DIFFERENTIAL/PLATELET - Abnormal; Notable for the following components:   Hemoglobin 15.7 (*)    HCT 46.1 (*)    All other components within normal limits  URINALYSIS, ROUTINE W REFLEX MICROSCOPIC - Abnormal; Notable for the following components:   Hgb urine dipstick MODERATE (*)    Ketones, ur 20 (*)    Bacteria, UA RARE (*)    All other components within normal limits  LIPASE, BLOOD  TROPONIN I    EKG EKG Interpretation  Date/Time:  Saturday June 26 2018 19:47:35 EST Ventricular Rate:  75 PR Interval:    QRS Duration: 76 QT Interval:  413 QTC Calculation: 459 R Axis:   80 Text Interpretation:  Sinus rhythm Nonspecific T abnormalities, lateral leads Baseline wander in lead(s) I II aVR V3 Abnormal ekg Confirmed by Gerhard MunchLockwood, Eri Mcevers [phone]) on 06/26/2018 8:16:43 PM   Radiology No results found.  Procedures Procedures (including critical care time)  Medications Ordered in ED Medications  sodium chloride 0.9 % bolus 500 mL (0 mLs Intravenous Stopped 06/26/18 2039)  LORazepam (ATIVAN) injection 0.5 mg (0.5 mg Intravenous Given 06/26/18 1952)     Initial Impression / Assessment and Plan / ED Course  I have reviewed the triage vital signs and the nursing notes.  Pertinent labs & imaging results that were available during my care of the patient were reviewed by me and considered in my medical decision making (see chart for details).     11:32 PM Awake, alert, in no distress. Patient took quite a bit of time to provide a  urine sample, which is generally unremarkable, no proteinuria. Labs otherwise reassuring as well, no evidence for renal dysfunction, no anemia. Patient has normal troponin, no evidence for ongoing endorgan effects, though she does have some hypertension. Some suspicion for the patient's anxiety contributing to part of her presentation, but with no vomiting here, reassuring labs as above, the patient is appropriate for discharge with close outpatient follow-up.   Final Clinical Impressions(s) / ED Diagnoses  Hypertensive urgency   Gerhard MunchLockwood, Rodrickus Min, MD 06/26/18 2333

## 2018-06-26 NOTE — ED Triage Notes (Signed)
Presents for hypertension and anxiety

## 2018-08-23 ENCOUNTER — Encounter: Payer: Self-pay | Admitting: Neurology

## 2018-08-23 ENCOUNTER — Ambulatory Visit: Payer: Self-pay | Admitting: Neurology

## 2018-08-23 VITALS — BP 136/96 | HR 91 | Ht 62.0 in | Wt 107.5 lb

## 2018-08-23 DIAGNOSIS — R200 Anesthesia of skin: Secondary | ICD-10-CM

## 2018-08-23 DIAGNOSIS — R29898 Other symptoms and signs involving the musculoskeletal system: Secondary | ICD-10-CM

## 2018-08-23 NOTE — Progress Notes (Signed)
PATIENT: Marie Diaz DOB: 1965-10-05  Chief Complaint  Patient presents with  . New Patient (Initial Visit)    Right side weakness that started in her foot in Dec 2019 that progressed up the right side of her body. ED follow up.      HISTORICAL  Marie Diaz is a 53 years old female, seen in request by her primary care NP Gilman Schmidt  for evaluation of right-sided weakness initial evaluation was on August 23, 2018. Her mother  I have reviewed and summarized the referring note from the referring physician.  She had a past medical history of hypertension, anxiety.   She noticed significant weight loss in 2019, took some 20 mg tablets prednisone from her friends, in December 2019, she began to notice right foot numbness tingling, burning, involving whole right foot, but denied weakness, on August 11, 2018, while she was walking with her mother and daughter at the local shopping center, she noticed right leg weakness, could not lift her right leg off the floor, difficulty using her right arm, she was treated at local emergency room, I was able to review emergency room record from [facility] on August 11, 2018, Laboratory evaluations, CBC showed hemoglobin of 12.2, INR of 1.0, creatinine of 0.69, estimated GFR of more than 60, potassium of 3.2, BNP was 113.3  Her symptoms last about 3 to 4 days, during those days, her mother noticed her increased anxiety, whole body tremor, the blood pressure was 200/100 upon ED presentation,  Per patient, she had MRI of the brain next day that was reported normal,  She now almost back to her baseline, denies significant low back pain, denies gait abnormality, but she still has intermittent right foot paresthesia,   She was recently started on Vrayler for anxiety treatment   REVIEW OF SYSTEMS: Full 14 system review of systems performed and notable only for loss of vision, feeling hot, numbness, weakness, difficulty swallowing,  tremor, depression, anxiety, change in appetite, racing thoughts  All other review of systems were negative.  ALLERGIES: Allergies  Allergen Reactions  . Tetracyclines & Related   . Ultram [Tramadol] Other (See Comments)    "knocks me out"    HOME MEDICATIONS: Current Outpatient Medications  Medication Sig Dispense Refill  . ALPRAZolam (XANAX) 1 MG tablet Take 2 mg by mouth at bedtime.     Marland Kitchen amLODipine (NORVASC) 5 MG tablet Take 10 mg by mouth daily.    Marland Kitchen aspirin EC 81 MG tablet Take 81 mg by mouth daily.    . cloNIDine (CATAPRES) 0.2 MG tablet Take 0.2 mg by mouth daily.    Marland Kitchen lisinopril (PRINIVIL,ZESTRIL) 10 MG tablet Take 10 mg by mouth daily.    . Multiple Vitamin (MULTIVITAMIN WITH MINERALS) TABS tablet Take 1 tablet by mouth daily.    . promethazine (PHENERGAN) 25 MG tablet Take 1 tablet (25 mg total) by mouth every 6 (six) hours as needed for nausea or vomiting. 30 tablet 0   No current facility-administered medications for this visit.     PAST MEDICAL HISTORY: Past Medical History:  Diagnosis Date  . Depression   . Hypertension     PAST SURGICAL HISTORY: Past Surgical History:  Procedure Laterality Date  . ABDOMINAL HYSTERECTOMY    . CHOLECYSTECTOMY    . HEMORRHOID SURGERY      FAMILY HISTORY: History reviewed. No pertinent family history.  SOCIAL HISTORY: Social History   Socioeconomic History  . Marital status: Married  Spouse name: Not on file  . Number of children: Not on file  . Years of education: Not on file  . Highest education level: Not on file  Occupational History  . Not on file  Social Needs  . Financial resource strain: Not on file  . Food insecurity:    Worry: Not on file    Inability: Not on file  . Transportation needs:    Medical: Not on file    Non-medical: Not on file  Tobacco Use  . Smoking status: Current Every Day Smoker    Packs/day: 0.50    Types: Cigarettes  . Smokeless tobacco: Never Used  Substance and Sexual  Activity  . Alcohol use: No  . Drug use: No  . Sexual activity: Not on file  Lifestyle  . Physical activity:    Days per week: Not on file    Minutes per session: Not on file  . Stress: Not on file  Relationships  . Social connections:    Talks on phone: Not on file    Gets together: Not on file    Attends religious service: Not on file    Active member of club or organization: Not on file    Attends meetings of clubs or organizations: Not on file    Relationship status: Not on file  . Intimate partner violence:    Fear of current or ex partner: Not on file    Emotionally abused: Not on file    Physically abused: Not on file    Forced sexual activity: Not on file  Other Topics Concern  . Not on file  Social History Narrative  . Not on file     PHYSICAL EXAM   Vitals:   08/23/18 0931  BP: (!) 136/96  Pulse: 91  Weight: 107 lb 8 oz (48.8 kg)  Height: 5\' 2"  (1.575 m)    Not recorded      Body mass index is 19.66 kg/m.  PHYSICAL EXAMNIATION:  Gen: NAD, conversant, well nourised, obese, well groomed                     Cardiovascular: Regular rate rhythm, no peripheral edema, warm, nontender. Eyes: Conjunctivae clear without exudates or hemorrhage Neck: Supple, no carotid bruits. Pulmonary: Clear to auscultation bilaterally   NEUROLOGICAL EXAM:  MENTAL STATUS: Speech:    Speech is normal; fluent and spontaneous with normal comprehension.  Cognition:     Orientation to time, place and person     Normal recent and remote memory     Normal Attention span and concentration     Normal Language, naming, repeating,spontaneous speech     Fund of knowledge   CRANIAL NERVES: CN II: Visual fields are full to confrontation. Fundoscopic exam is normal with sharp discs and no vascular changes. Pupils are round equal and briskly reactive to light. CN III, IV, VI: extraocular movement are normal. No ptosis. CN V: Facial sensation is intact to pinprick in all 3 divisions  bilaterally. Corneal responses are intact.  CN VII: Face is symmetric with normal eye closure and smile. CN VIII: Hearing is normal to rubbing fingers CN IX, X: Palate elevates symmetrically. Phonation is normal. CN XI: Head turning and shoulder shrug are intact CN XII: Tongue is midline with normal movements and no atrophy.  MOTOR: There is no pronator drift of out-stretched arms. Muscle bulk and tone are normal. Muscle strength is normal.  REFLEXES: Reflexes are 2+ and symmetric at the  biceps, triceps, knees, and ankles. Plantar responses are flexor.  SENSORY: Intact to light touch, pinprick, positional sensation and vibratory sensation are intact in fingers and toes.  COORDINATION: Rapid alternating movements and fine finger movements are intact. There is no dysmetria on finger-to-nose and heel-knee-shin.    GAIT/STANCE: Posture is normal. Gait is steady with normal steps, base, arm swing, and turning. Heel and toe walking are normal. Tandem gait is normal.  Romberg is absent.   DIAGNOSTIC DATA (LABS, IMAGING, TESTING) - I reviewed patient records, labs, notes, testing and imaging myself where available.   ASSESSMENT AND PLAN  Marie Diaz is a 53 y.o. female   Right foot paresthesia  Essentially normal neurological examinations,  I have suggested potential evaluation with EMG nerve conduction study, MRI of lumbar  She wants to hold off evaluation due to financial concerns at this point,   Levert Feinstein, M.D. Ph.D.  [facility] [address], [address] [place], [place] [address] Ph: [phone] Fax: [phone]  CC:  Rebekah Chesterfield, NP

## 2019-04-09 ENCOUNTER — Emergency Department (HOSPITAL_COMMUNITY): Payer: Self-pay

## 2019-04-09 ENCOUNTER — Other Ambulatory Visit: Payer: Self-pay

## 2019-04-09 ENCOUNTER — Emergency Department (HOSPITAL_COMMUNITY)
Admission: EM | Admit: 2019-04-09 | Discharge: 2019-04-09 | Disposition: A | Discharge status: Home / Self Care | Payer: Self-pay | Attending: Emergency Medicine | Admitting: Emergency Medicine

## 2019-04-09 ENCOUNTER — Encounter (HOSPITAL_COMMUNITY): Payer: Self-pay | Admitting: Emergency Medicine

## 2019-04-09 DIAGNOSIS — F17210 Nicotine dependence, cigarettes, uncomplicated: Secondary | ICD-10-CM | POA: Insufficient documentation

## 2019-04-09 DIAGNOSIS — J209 Acute bronchitis, unspecified: Secondary | ICD-10-CM | POA: Insufficient documentation

## 2019-04-09 DIAGNOSIS — I10 Essential (primary) hypertension: Secondary | ICD-10-CM | POA: Insufficient documentation

## 2019-04-09 LAB — BASIC METABOLIC PANEL
Anion gap: 6 (ref 5–15)
BUN: 10 mg/dL (ref 6–20)
CO2: 25 mmol/L (ref 22–32)
Calcium: 8.4 mg/dL — ABNORMAL LOW (ref 8.9–10.3)
Chloride: 110 mmol/L (ref 98–111)
Creatinine, Ser: 0.67 mg/dL (ref 0.44–1.00)
GFR calc Af Amer: >60 mL/min (ref >60)
GFR calc non Af Amer: >60 mL/min (ref >60)
Glucose, Bld: 95 mg/dL (ref 70–99)
Potassium: 3.6 mmol/L (ref 3.5–5.1)
Sodium: 141 mmol/L (ref 135–145)

## 2019-04-09 LAB — CBC WITH DIFFERENTIAL/PLATELET
Abs Immature Granulocytes: 0.05 10*3/uL (ref 0.00–0.07)
Basophils Absolute: 0.1 10*3/uL (ref 0.0–0.1)
Basophils Relative: 1 %
Eosinophils Absolute: 0.1 10*3/uL (ref 0.0–0.5)
Eosinophils Relative: 1 %
HCT: 41.8 % (ref 36.0–46.0)
Hemoglobin: 14 g/dL (ref 12.0–15.0)
Immature Granulocytes: 1 %
Lymphocytes Relative: 26 %
Lymphs Abs: 2.4 10*3/uL (ref 0.7–4.0)
MCH: 31 pg (ref 26.0–34.0)
MCHC: 33.5 g/dL (ref 30.0–36.0)
MCV: 92.7 fL (ref 80.0–100.0)
Monocytes Absolute: 0.5 10*3/uL (ref 0.1–1.0)
Monocytes Relative: 6 %
Neutro Abs: 6.1 10*3/uL (ref 1.7–7.7)
Neutrophils Relative %: 65 %
Platelets: 279 10*3/uL (ref 150–400)
RBC: 4.51 MIL/uL (ref 3.87–5.11)
RDW: 12 % (ref 11.5–15.5)
WBC: 9.2 10*3/uL (ref 4.0–10.5)
nRBC: 0 % (ref 0.0–0.2)

## 2019-04-09 MED ORDER — BENZONATATE 100 MG PO CAPS
200.0000 mg | ORAL_CAPSULE | Freq: Once | ORAL | Status: AC
  Administered 2019-04-09: 17:00:00 200 mg via ORAL
  Filled 2019-04-09: qty 2

## 2019-04-09 MED ORDER — ALBUTEROL SULFATE HFA 108 (90 BASE) MCG/ACT IN AERS
2.0000 | INHALATION_SPRAY | Freq: Once | RESPIRATORY_TRACT | Status: AC
  Administered 2019-04-09: 17:00:00 2 via RESPIRATORY_TRACT

## 2019-04-09 MED ORDER — AZITHROMYCIN 250 MG PO TABS: 250.0000 mg | ORAL_TABLET | Freq: Every day | ORAL | 0 refills | Status: DC

## 2019-04-09 MED ORDER — METHYLPREDNISOLONE SODIUM SUCC 125 MG IJ SOLR
125.0000 mg | Freq: Once | INTRAMUSCULAR | Status: AC
  Administered 2019-04-09: 17:00:00 125 mg via INTRAVENOUS
  Filled 2019-04-09: qty 2

## 2019-04-09 MED ORDER — BENZONATATE 100 MG PO CAPS: 200.0000 mg | ORAL_CAPSULE | Freq: Three times a day (TID) | ORAL | 0 refills | Status: DC | PRN

## 2019-04-09 MED ORDER — PREDNISONE 50 MG PO TABS: ORAL_TABLET | ORAL | 0 refills | Status: DC

## 2019-04-09 MED ORDER — SODIUM CHLORIDE 0.9 % IV BOLUS
1000.0000 mL | Freq: Once | INTRAVENOUS | Status: AC
Start: 2019-04-09 — End: 2019-04-09
  Administered 2019-04-09: 17:00:00 1000 mL via INTRAVENOUS

## 2019-04-09 MED ORDER — AZITHROMYCIN 250 MG PO TABS
500.0000 mg | ORAL_TABLET | Freq: Once | ORAL | Status: AC
  Administered 2019-04-09: 18:00:00 500 mg via ORAL
  Filled 2019-04-09: qty 2

## 2019-04-09 NOTE — ED Triage Notes (Signed)
Pt reports that she has been sick since Monday this week. Had COVID test performed thursday but doesn't know results yet. No know contact with anyone COVID positive. Reports cough, generalized weakness/fatigue.

## 2019-04-09 NOTE — ED Provider Notes (Signed)
[facility] EMERGENCY DEPARTMENT Provider Note   CSN: [id number] Arrival date & time: 04/09/19  1314     History   Chief Complaint Chief Complaint  Patient presents with  . Cough    HPI Marie Diaz is a 53 y.o. female with a history of  HTN, depression and is a 1/2 ppd smoker presenting with a one week history of cough productive of green sputum, intermittent wheezing and shortness of breath along with generalized fatigue. She denies fevers or chills, also denies chest pain but endorses bilateral rib cage tenderness anteriorly with cough. Cough triggred by tickle sensation. Positive for nasal congestion, denies loss of taste or smell, no n/v or abd pain but had diarrhea x 2 day, now resolved x 48 hours. Was prescribed hydrocodone cough syrup which did not relieve her cough.  She had a covid test performed 2 days ago in [place] but has not heard the results. Denies any known exposures.  Reports generalized weakness, no focal weakness, slight transient dizziness when she stands.  No dysuria or reduced urine production.     The history is provided by the patient.    Past Medical History:  Diagnosis Date  . Depression   . Hypertension     Patient Active Problem List   Diagnosis Date Noted  . Numbness of right foot 08/23/2018  . Right leg weakness 08/23/2018    Past Surgical History:  Procedure Laterality Date  . ABDOMINAL HYSTERECTOMY    . CHOLECYSTECTOMY    . HEMORRHOID SURGERY       OB History   No obstetric history on file.      Home Medications    Prior to Admission medications   Medication Sig Start Date End Date Taking? Authorizing Provider  ALPRAZolam Duanne Moron) 1 MG tablet Take 2 mg by mouth at bedtime.    Yes [provider]  amLODipine (NORVASC) 10 MG tablet Take 10 mg by mouth daily.    Yes [provider]  aspirin EC 81 MG tablet Take 81 mg by mouth daily.   Yes [provider]  cloNIDine (CATAPRES) 0.2 MG tablet Take 0.2 mg by  mouth daily.   Yes [provider]  lisinopril (ZESTRIL) 20 MG tablet Take 20 mg by mouth daily.    Yes [provider]  Multiple Vitamin (MULTIVITAMIN WITH MINERALS) TABS tablet Take 1 tablet by mouth daily.   Yes [provider]  UNKNOWN TO PATIENT Take 20 mg by mouth daily. ANTI-DEPRESSANT   Yes [provider]  azithromycin (ZITHROMAX) 250 MG tablet Take 1 tablet (250 mg total) by mouth daily. Take first 2 tablets together, then 1 every day until finished. 04/09/19   Evalee Jefferson, PA-C  benzonatate (TESSALON) 100 MG capsule Take 2 capsules (200 mg total) by mouth 3 (three) times daily as needed. 04/09/19   Evalee Jefferson, PA-C  predniSONE (DELTASONE) 50 MG tablet 1 tablet by mouth daily for 5 days. 04/09/19   Evalee Jefferson, PA-C  promethazine (PHENERGAN) 25 MG tablet Take 1 tablet (25 mg total) by mouth every 6 (six) hours as needed for nausea or vomiting. 07/25/17   Mesner, Corene Cornea, MD    Family History No family history on file.  Social History Social History   Tobacco Use  . Smoking status: Current Every Day Smoker    Packs/day: 0.50    Types: Cigarettes  . Smokeless tobacco: Never Used  Substance Use Topics  . Alcohol use: No  . Drug use: No  Allergies   Tetracyclines & related and Ultram [tramadol]   Review of Systems Review of Systems  Constitutional: Positive for fatigue. Negative for chills and fever.  HENT: Positive for congestion. Negative for sore throat.   Eyes: Negative.   Respiratory: Positive for cough, shortness of breath and wheezing. Negative for chest tightness.   Cardiovascular: Negative for chest pain, palpitations and leg swelling.  Gastrointestinal: Positive for diarrhea. Negative for abdominal pain, nausea and vomiting.  Genitourinary: Negative.  Negative for decreased urine volume and dysuria.  Musculoskeletal: Negative for arthralgias, joint swelling and neck pain.  Skin: Negative.  Negative for rash and wound.   Neurological: Positive for weakness. Negative for dizziness, light-headedness, numbness and headaches.  Psychiatric/Behavioral: Negative.      Physical Exam Updated Vital Signs BP 136/85   Pulse 85   Temp 98.2 F (36.8 C) (Oral)   Resp 20   Ht 5\' 2"  (1.575 m)   Wt 50.8 kg   SpO2 98%   BMI 20.49 kg/m   Physical Exam Constitutional:      Appearance: She is well-developed.  HENT:     Head: Normocephalic and atraumatic.     Right Ear: Ear canal normal.     Left Ear: Ear canal normal.     Nose: Mucosal edema and congestion present. No rhinorrhea.     Mouth/Throat:     Mouth: Mucous membranes are dry.     Pharynx: Oropharynx is clear. Uvula midline. No oropharyngeal exudate or posterior oropharyngeal erythema.     Tonsils: No tonsillar abscesses.  Eyes:     Conjunctiva/sclera: Conjunctivae normal.  Cardiovascular:     Rate and Rhythm: Normal rate.     Pulses: Normal pulses.     Heart sounds: Normal heart sounds.  Pulmonary:     Effort: Pulmonary effort is normal. No respiratory distress.     Breath sounds: No wheezing, rhonchi or rales.     Comments: Reduced breath sounds throughout.  Poor effort, no wheeze.  Triggers dry cough. Abdominal:     General: There is no distension.     Palpations: Abdomen is soft.     Tenderness: There is no abdominal tenderness. There is no guarding.  Musculoskeletal: Normal range of motion.        General: No swelling.     Right lower leg: No edema.     Left lower leg: No edema.  Skin:    General: Skin is warm and dry.     Findings: No rash.  Neurological:     General: No focal deficit present.     Mental Status: She is alert and oriented to person, place, and time.      ED Treatments / Results  Labs (all labs ordered are listed, but only abnormal results are displayed) Labs Reviewed  BASIC METABOLIC PANEL - Abnormal; Notable for the following components:      Result Value   Calcium 8.4 (*)    All other components within normal  limits  CBC WITH DIFFERENTIAL/PLATELET    EKG None  Radiology Dg Chest Portable 1 View  Result Date: 04/09/2019 CLINICAL DATA:  Cough and weakness for 6 days. EXAM: PORTABLE CHEST 1 VIEW COMPARISON:  Single-view of the chest 01/21/2019 in 08/11/2018. FINDINGS: There is peribronchial thickening which is new since the prior exams. No consolidative, pneumothorax or effusion. Heart size is normal. No acute bony abnormality. IMPRESSION: Peribronchial thickening suggestive of bronchitis. No focal airspace disease. Electronically Signed   By: Drusilla Kannerhomas  Dalessio M.D.  On: 04/09/2019 17:11    Procedures Procedures (including critical care time)  Medications Ordered in ED Medications  azithromycin (ZITHROMAX) tablet 500 mg (has no administration in time range)  sodium chloride 0.9 % bolus 1,000 mL (1,000 mLs Intravenous New Bag/Given 04/09/19 1716)  methylPREDNISolone sodium succinate (SOLU-MEDROL) 125 mg/2 mL injection 125 mg (125 mg Intravenous Given 04/09/19 1718)  benzonatate (TESSALON) capsule 200 mg (200 mg Oral Given 04/09/19 1717)  albuterol (VENTOLIN HFA) 108 (90 Base) MCG/ACT inhaler 2 puff (2 puffs Inhalation Given 04/09/19 1716)     Initial Impression / Assessment and Plan / ED Course  I have reviewed the triage vital signs and the nursing notes.  Pertinent labs & imaging results that were available during my care of the patient were reviewed by me and considered in my medical decision making (see chart for details).        Patient's labs and imaging reviewed, COVID test from an outside source is currently still pending.  Her chest x-ray does suggest bronchitis.  She was placed on a Z-Pak, also given prednisone pulse dosing, albuterol MDI for home use PRN.  Tessalon for symptom relief.  Return precautions were discussed.  Home isolation was also discussed until she has a hopeful negative COVID test.  PRN follow-up with her PCP anticipated.  TAKEYSHA BONK was evaluated in  Emergency Department on 04/09/2019 for the symptoms described in the history of present illness. She was evaluated in the context of the global COVID-19 pandemic, which necessitated consideration that the patient might be at risk for infection with the SARS-CoV-2 virus that causes COVID-19. Institutional protocols and algorithms that pertain to the evaluation of patients at risk for COVID-19 are in a state of rapid change based on information released by regulatory bodies including the CDC and federal and state organizations. These policies and algorithms were followed during the patient's care in the ED.   Final Clinical Impressions(s) / ED Diagnoses   Final diagnoses:  Acute bronchitis, unspecified organism    ED Discharge Orders         Ordered    azithromycin (ZITHROMAX) 250 MG tablet  Daily     04/09/19 1759    benzonatate (TESSALON) 100 MG capsule  3 times daily PRN     04/09/19 1759    predniSONE (DELTASONE) 50 MG tablet     04/09/19 1759           Burgess Amor, PA-C 04/09/19 1803    Samuel Jester, DO 04/14/19 0000

## 2019-04-09 NOTE — Discharge Instructions (Addendum)
Your labs and x-rays are reassuring today, your chest x-ray does suggest a bronchitis but no pneumonia.  However given you are a smoker you are being prescribed antibiotics and have received your first dose here, take your next dose of Zithromax tomorrow.  Also take your next dose of prednisone tomorrow as you have received an IV dose today.  You may continue using the Tessalon for cough relief and the albuterol inhaler can be used every 4 hours, taking 2 puffs if needed for cough or shortness of breath.  Rest and make sure you are drinking plenty of fluids.  You will need to maintain home isolation until the  COVID test you had completed on Thursday has resulted and is negative.  Get rechecked for any new or worsening symptoms.     Person Under Monitoring Name: Marie Diaz  Location: [address] [place] [address]   Infection Prevention Recommendations for Individuals Confirmed to have, or Being Evaluated for, 2019 Novel Coronavirus (COVID-19) Infection Who Receive Care at Home  Individuals who are confirmed to have, or are being evaluated for, COVID-19 should follow the prevention steps below until a healthcare provider or local or state health department says they can return to normal activities.  Stay home except to get medical care You should restrict activities outside your home, except for getting medical care. Do not go to work, school, or public areas, and do not use public transportation or taxis.  Call ahead before visiting your doctor Before your medical appointment, call the healthcare provider and tell them that you have, or are being evaluated for, COVID-19 infection. This will help the healthcare providers office take steps to keep other people from getting infected. Ask your healthcare provider to call the local or state health department.  Monitor your symptoms Seek prompt medical attention if your illness is worsening (e.g., difficulty breathing). Before going to  your medical appointment, call the healthcare provider and tell them that you have, or are being evaluated for, COVID-19 infection. Ask your healthcare provider to call the local or state health department.  Wear a facemask You should wear a facemask that covers your nose and mouth when you are in the same room with other people and when you visit a healthcare provider. People who live with or visit you should also wear a facemask while they are in the same room with you.  Separate yourself from other people in your home As much as possible, you should stay in a different room from other people in your home. Also, you should use a separate bathroom, if available.  Avoid sharing household items You should not share dishes, drinking glasses, cups, eating utensils, towels, bedding, or other items with other people in your home. After using these items, you should wash them thoroughly with soap and water.  Cover your coughs and sneezes Cover your mouth and nose with a tissue when you cough or sneeze, or you can cough or sneeze into your sleeve. Throw used tissues in a lined trash can, and immediately wash your hands with soap and water for at least 20 seconds or use an alcohol-based hand rub.  Wash your [employer] your hands often and thoroughly with soap and water for at least 20 seconds. You can use an alcohol-based hand sanitizer if soap and water are not available and if your hands are not visibly dirty. Avoid touching your eyes, nose, and mouth with unwashed hands.   Prevention Steps for Caregivers and Household  Members of Individuals Confirmed to have, or Being Evaluated for, COVID-19 Infection Being Cared for in the Home  If you live with, or provide care at home for, a person confirmed to have, or being evaluated for, COVID-19 infection please follow these guidelines to prevent infection:  Follow healthcare providers instructions Make sure that you understand and can help the  patient follow any healthcare provider instructions for all care.  Provide for the patients basic needs You should help the patient with basic needs in the home and provide support for getting groceries, prescriptions, and other personal needs.  Monitor the patients symptoms If they are getting sicker, call his or her medical provider and tell them that the patient has, or is being evaluated for, COVID-19 infection. This will help the healthcare providers office take steps to keep other people from getting infected. Ask the healthcare provider to call the local or state health department.  Limit the number of people who have contact with the patient If possible, have only one caregiver for the patient. Other household members should stay in another home or place of residence. If this is not possible, they should stay in another room, or be separated from the patient as much as possible. Use a separate bathroom, if available. Restrict visitors who do not have an essential need to be in the home.  Keep older adults, very young children, and other sick people away from the patient Keep older adults, very young children, and those who have compromised immune systems or chronic health conditions away from the patient. This includes people with chronic heart, lung, or kidney conditions, diabetes, and cancer.  Ensure good ventilation Make sure that shared spaces in the home have good air flow, such as from an air conditioner or an opened window, weather permitting.  Wash your hands often Wash your hands often and thoroughly with soap and water for at least 20 seconds. You can use an alcohol based hand sanitizer if soap and water are not available and if your hands are not visibly dirty. Avoid touching your eyes, nose, and mouth with unwashed hands. Use disposable paper towels to dry your hands. If not available, use dedicated cloth towels and replace them when they become wet.  Wear a  facemask and gloves Wear a disposable facemask at all times in the room and gloves when you touch or have contact with the patients blood, body fluids, and/or secretions or excretions, such as sweat, saliva, sputum, nasal mucus, vomit, urine, or feces.  Ensure the mask fits over your nose and mouth tightly, and do not touch it during use. Throw out disposable facemasks and gloves after using them. Do not reuse. Wash your hands immediately after removing your facemask and gloves. If your personal clothing becomes contaminated, carefully remove clothing and launder. Wash your hands after handling contaminated clothing. Place all used disposable facemasks, gloves, and other waste in a lined container before disposing them with other household waste. Remove gloves and wash your hands immediately after handling these items.  Do not share dishes, glasses, or other household items with the patient Avoid sharing household items. You should not share dishes, drinking glasses, cups, eating utensils, towels, bedding, or other items with a patient who is confirmed to have, or being evaluated for, COVID-19 infection. After the person uses these items, you should wash them thoroughly with soap and water.  Wash laundry thoroughly Immediately remove and wash clothes or bedding that have blood, body fluids, and/or secretions or excretions,  such as sweat, saliva, sputum, nasal mucus, vomit, urine, or feces, on them. Wear gloves when handling laundry from the patient. Read and follow directions on labels of laundry or clothing items and detergent. In general, wash and dry with the warmest temperatures recommended on the label.  Clean all areas the individual has used often Clean all touchable surfaces, such as counters, tabletops, doorknobs, bathroom fixtures, toilets, phones, keyboards, tablets, and bedside tables, every day. Also, clean any surfaces that may have blood, body fluids, and/or secretions or excretions  on them. Wear gloves when cleaning surfaces the patient has come in contact with. Use a diluted bleach solution (e.g., dilute bleach with 1 part bleach and 10 parts water) or a household disinfectant with a label that says EPA-registered for coronaviruses. To make a bleach solution at home, add 1 tablespoon of bleach to 1 quart (4 cups) of water. For a larger supply, add  cup of bleach to 1 gallon (16 cups) of water. Read labels of cleaning products and follow recommendations provided on product labels. Labels contain instructions for safe and effective use of the cleaning product including precautions you should take when applying the product, such as wearing gloves or eye protection and making sure you have good ventilation during use of the product. Remove gloves and wash hands immediately after cleaning.  Monitor yourself for signs and symptoms of illness Caregivers and household members are considered close contacts, should monitor their health, and will be asked to limit movement outside of the home to the extent possible. Follow the monitoring steps for close contacts listed on the symptom monitoring form.   ? If you have additional questions, contact your local health department or call the epidemiologist on call at [phone] (available 24/7). ? This guidance is subject to change. For the most up-to-date guidance from [facility], please refer to their website: [email or web address]

## 2019-06-11 ENCOUNTER — Emergency Department (HOSPITAL_COMMUNITY)
Admission: EM | Admit: 2019-06-11 | Discharge: 2019-06-12 | Disposition: A | Discharge status: Home / Self Care | Payer: Self-pay | Attending: Emergency Medicine | Admitting: Emergency Medicine

## 2019-06-11 ENCOUNTER — Encounter (HOSPITAL_COMMUNITY): Payer: Self-pay | Admitting: Emergency Medicine

## 2019-06-11 ENCOUNTER — Other Ambulatory Visit: Payer: Self-pay

## 2019-06-11 DIAGNOSIS — R45851 Suicidal ideations: Secondary | ICD-10-CM | POA: Insufficient documentation

## 2019-06-11 DIAGNOSIS — F329 Major depressive disorder, single episode, unspecified: Secondary | ICD-10-CM | POA: Insufficient documentation

## 2019-06-11 DIAGNOSIS — F419 Anxiety disorder, unspecified: Secondary | ICD-10-CM | POA: Insufficient documentation

## 2019-06-11 DIAGNOSIS — I10 Essential (primary) hypertension: Secondary | ICD-10-CM | POA: Insufficient documentation

## 2019-06-11 DIAGNOSIS — F17210 Nicotine dependence, cigarettes, uncomplicated: Secondary | ICD-10-CM | POA: Insufficient documentation

## 2019-06-11 DIAGNOSIS — F32A Depression, unspecified: Secondary | ICD-10-CM

## 2019-06-11 DIAGNOSIS — Z20828 Contact with and (suspected) exposure to other viral communicable diseases: Secondary | ICD-10-CM | POA: Insufficient documentation

## 2019-06-11 LAB — COMPREHENSIVE METABOLIC PANEL
ALT: 11 U/L (ref 0–44)
AST: 14 U/L — ABNORMAL LOW (ref 15–41)
Albumin: 4.2 g/dL (ref 3.5–5.0)
Alkaline Phosphatase: 74 U/L (ref 38–126)
Anion gap: 9 (ref 5–15)
BUN: 12 mg/dL (ref 6–20)
CO2: 25 mmol/L (ref 22–32)
Calcium: 8.8 mg/dL — ABNORMAL LOW (ref 8.9–10.3)
Chloride: 109 mmol/L (ref 98–111)
Creatinine, Ser: 0.65 mg/dL (ref 0.44–1.00)
GFR calc Af Amer: >60 mL/min (ref >60)
GFR calc non Af Amer: >60 mL/min (ref >60)
Glucose, Bld: 117 mg/dL — ABNORMAL HIGH (ref 70–99)
Potassium: 3.2 mmol/L — ABNORMAL LOW (ref 3.5–5.1)
Sodium: 143 mmol/L (ref 135–145)
Total Bilirubin: 0.2 mg/dL — ABNORMAL LOW (ref 0.3–1.2)
Total Protein: 7.1 g/dL (ref 6.5–8.1)

## 2019-06-11 LAB — CBC
HCT: 44.9 % (ref 36.0–46.0)
Hemoglobin: 15.1 g/dL — ABNORMAL HIGH (ref 12.0–15.0)
MCH: 31.2 pg (ref 26.0–34.0)
MCHC: 33.6 g/dL (ref 30.0–36.0)
MCV: 92.8 fL (ref 80.0–100.0)
Platelets: 292 10*3/uL (ref 150–400)
RBC: 4.84 MIL/uL (ref 3.87–5.11)
RDW: 12.1 % (ref 11.5–15.5)
WBC: 8.6 10*3/uL (ref 4.0–10.5)
nRBC: 0 % (ref 0.0–0.2)

## 2019-06-11 LAB — RAPID URINE DRUG SCREEN, HOSP PERFORMED
Amphetamines: NOT DETECTED
Barbiturates: NOT DETECTED
Benzodiazepines: POSITIVE — AB
Cocaine: NOT DETECTED
Opiates: NOT DETECTED
Tetrahydrocannabinol: NOT DETECTED

## 2019-06-11 LAB — ETHANOL: Alcohol, Ethyl (B): <10 mg/dL (ref <10)

## 2019-06-11 MED ORDER — ACETAMINOPHEN 325 MG PO TABS: 650.0000 mg | ORAL_TABLET | ORAL | Status: DC | PRN

## 2019-06-11 MED ORDER — ALUM & MAG HYDROXIDE-SIMETH 200-200-20 MG/5ML PO SUSP: 30.0000 mL | Freq: Four times a day (QID) | ORAL | Status: DC | PRN

## 2019-06-11 MED ORDER — ONDANSETRON HCL 4 MG PO TABS: 4.0000 mg | ORAL_TABLET | Freq: Three times a day (TID) | ORAL | Status: DC | PRN

## 2019-06-11 NOTE — ED Provider Notes (Signed)
Emergency Department Provider Note   I have reviewed the triage vital signs and the nursing notes.   HISTORY  Chief Complaint Suicidal   HPI Marie Diaz is a 53 y.o. female with past medical history of hypertension presents to the emergency department with increased feelings of hopelessness and passive SI.  Patient states that she has 6 members of her family who are dependent on her for care.  She feels guilt after having to put her grandmother into a nursing home approximately 1 year ago.  She states that her husband is addicted to painkillers, her father is an alcoholic, and both children are addicted to drugs and alcohol.  She feels that all of them depend on her primarily for multiple things and that she is feeling increasingly frustrated, hopeless.  She denies any active suicidal plan.  Denies homicidal ideation.  She does not drink alcohol or use drugs.  She reached out to a counselor at church who plans to see her on Monday but she has not been seen by a psychiatrist or other mental health professional in the past.  No prior history of suicide attempt. She does feel like "not being here" sometimes but today became suddenly overwhelmed.   Past Medical History:  Diagnosis Date  . Depression   . Hypertension     Patient Active Problem List   Diagnosis Date Noted  . Numbness of right foot 08/23/2018  . Right leg weakness 08/23/2018    Past Surgical History:  Procedure Laterality Date  . ABDOMINAL HYSTERECTOMY    . CHOLECYSTECTOMY    . HEMORRHOID SURGERY      Allergies Tetracyclines & related and Ultram [tramadol]  History reviewed. No pertinent family history.  Social History Social History   Tobacco Use  . Smoking status: Current Every Day Smoker    Packs/day: 0.50    Types: Cigarettes  . Smokeless tobacco: Never Used  Substance Use Topics  . Alcohol use: No  . Drug use: No    Review of Systems  Constitutional: No fever/chills Eyes: No visual  changes. ENT: No sore throat. Cardiovascular: Denies chest pain. Respiratory: Denies shortness of breath. Gastrointestinal: No abdominal pain.  No nausea, no vomiting.  No diarrhea.  No constipation. Genitourinary: Negative for dysuria. Musculoskeletal: Negative for back pain. Skin: Negative for rash. Neurological: Negative for headaches, focal weakness or numbness. Psychiatric: Hopelessness and passive SI.   10-point ROS otherwise negative.  ____________________________________________   PHYSICAL EXAM:  VITAL SIGNS: Vitals:   06/12/19 0012 06/12/19 0906  BP: (!) 145/78 128/70  Pulse: 88 62  Resp: 20 20  Temp: 98.1 F (36.7 C) 98 F (36.7 C)  SpO2: 99% 97%     Constitutional: Alert and oriented. Well appearing and in no acute distress. Eyes: Conjunctivae are normal.  Head: Atraumatic. Nose: No congestion/rhinnorhea. Mouth/Throat: Mucous membranes are moist.   Neck: No stridor.   Cardiovascular: Normal rate, regular rhythm. Good peripheral circulation. Grossly normal heart sounds.   Respiratory: Normal respiratory effort.  No retractions. Lungs CTAB. Gastrointestinal: Soft and nontender. No distention.  Musculoskeletal: No lower extremity tenderness nor edema. No gross deformities of extremities. Neurologic:  Normal speech and language. No gross focal neurologic deficits are appreciated.  Skin:  Skin is warm, dry and intact. No rash noted. Psychiatric: Mood and affect are normal. Patient is tearful. Speech and behavior are normal. Good insight and organized thought process.   ____________________________________________   LABS (all labs ordered are listed, but only abnormal results are  displayed)  Labs Reviewed  COMPREHENSIVE METABOLIC PANEL - Abnormal; Notable for the following components:      Result Value   Potassium 3.2 (*)    Glucose, Bld 117 (*)    Calcium 8.8 (*)    AST 14 (*)    Total Bilirubin 0.2 (*)    All other components within normal limits  CBC  - Abnormal; Notable for the following components:   Hemoglobin 15.1 (*)    All other components within normal limits  RAPID URINE DRUG SCREEN, HOSP PERFORMED - Abnormal; Notable for the following components:   Benzodiazepines POSITIVE (*)    All other components within normal limits  RESPIRATORY PANEL BY RT PCR (FLU A&B, COVID)  ETHANOL   ____________________________________________   PROCEDURES  Procedure(s) performed:   Procedures  None  ____________________________________________   INITIAL IMPRESSION / ASSESSMENT AND PLAN / ED COURSE  Pertinent labs & imaging results that were available during my care of the patient were reviewed by me and considered in my medical decision making (see chart for details).   Patient presents to the emergency department for evaluation of increased depression type feelings with hopelessness and passive SI.  No plan.  Patient feeling acutely overwhelmed and reports feeling unsafe at home with herself.  Plan for screening labs and psychiatry evaluation.  No IVC at this time.  Patient seems mostly depressed and is very eager to seek help. Will ask for TTS evaluation.  Patient is medically clear.  ____________________________________________  FINAL CLINICAL IMPRESSION(S) / ED DIAGNOSES  Final diagnoses:  Depression, unspecified depression type    Note:  This document was prepared using Dragon voice recognition software and may include unintentional dictation errors.  Nanda Quinton, MD, [facility]    Dreyson Mishkin, [facility], MD 06/12/19 [phone]

## 2019-06-11 NOTE — ED Triage Notes (Signed)
Patient states that she has been going a lot at home with family problems. Patient crying in triage. Patient states that she does have suicidal thoughts. 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 Patient states she is going crazy. Patient has a hx of depression. Patient just started lexapro 3 months ago.

## 2019-06-11 NOTE — BH Assessment (Signed)
Tele Assessment Note   Patient Name: Marie Diaz MRN: [id number] Referring Physician: Alona Bene, MD Location of Patient: [facility], [phone] Location of Provider: Behavioral Health TTS Department  Marie Diaz is an 53 y.o. married female who presents unaccompanied to [facility] ED reporting symptoms of depression and anxiety. Pt states her aunt brought her to [facility] tonight because she feels overwhelmed and hopeless. Pt is very tearful and acknowledges she is experiencing recurring suicidal ideation with no specific plan or intent but she has thoughts of "not being here." Pt states, "I feel like the weight of the world is on me." Pt acknowledges symptoms including crying spells, social withdrawal, loss of interest in usual pleasures, fatigue, irritability, decreased concentration, decreased appetite and feelings of guilt and hopelessness. She denies history of suicide attempts. Pt denies any history of intentional self-injurious behaviors. Pt denies current homicidal ideation or history of violence. Pt denies any history of auditory or visual hallucinations. Pt denies history of alcohol or other substance use.  Pt reports several stressors. She says she has six family members who depend on her because they have substance abuse problems. She says her husband has a history of stroke and breaking his back and that he is addicted to pain medications. She says her 13 year old son lives with them and he is addicted to drugs. She says her 53 year old daughter also has substance abuse problems. Pt says her father abuses alcohol and calls her several times a day intoxicated and repeating the same stories. She says she feels guilty after having to put her grandmother in a nursing home one year ago. She identifies her aunt as her primary support. She denies legal problems. She says she has rifles in her home locked in a gun safe.  Pt reports her primary care physician changed her antidepressant to  Lexapro three months ago. She says she is also prescribed Xanax PRN. She says she has never had outpatient therapy or psychiatry. She reports one previous psychiatric admission at [facility] in 2011 for suicidal ideation. She says she spoke to her aunt's minister who is willing to provide outpatient counseling starting Monday.   Pt is dressed in hospital scrubs, alert and oriented x4. Pt speaks in a clear tone, at moderate volume and normal pace. Motor behavior appears normal. Eye contact is good and Pt is tearful. Pt's mood is depressed and anxious, affect is congruent with mood. Thought process is coherent and relevant. There is no indication Pt is currently responding to internal stimuli or experiencing delusional thought content. Pt states she isn't sure whether she needs to be in a psychiatric hospital but that she does need help because she cannot cope.   Diagnosis: F33.2 Major depressive disorder, Recurrent episode, Severe  Past Medical History:  Past Medical History:  Diagnosis Date  . Depression   . Hypertension     Past Surgical History:  Procedure Laterality Date  . ABDOMINAL HYSTERECTOMY    . CHOLECYSTECTOMY    . HEMORRHOID SURGERY      Family History: History reviewed. No pertinent family history.  Social History:  reports that she has been smoking cigarettes. She has been smoking about 0.50 packs per day. She has never used smokeless tobacco. She reports that she does not drink alcohol or use drugs.  Additional Social History:  Alcohol / Drug Use Pain Medications: Denies abuse Prescriptions: Denies abuse Over the Counter: Denies abuse History of alcohol / drug use?: No history of alcohol / drug abuse  Longest period of sobriety (when/how long): NA  CIWA:   COWS:    Allergies:  Allergies  Allergen Reactions  . Tetracyclines & Related   . Ultram [Tramadol] Other (See Comments)    "knocks me out"    Home Medications: (Not in a hospital admission)   OB/GYN Status:   No LMP recorded. Patient has had a hysterectomy.  General Assessment Data Location of Assessment: [facility] TTS Assessment: In system Is this a Tele or Face-to-Face Assessment?: Tele Assessment Is this an Initial Assessment or a Re-assessment for this encounter?: Initial Assessment Patient Accompanied by:: N/A Language Other than English: No Living Arrangements: Other (Comment)(Lives with husband and son) What gender do you identify as?: Female Marital status: Married Pharmacist, community name: NA Pregnancy Status: No Living Arrangements: Spouse/significant other, Children Can pt return to current living arrangement?: Yes Admission Status: Voluntary Is patient capable of signing voluntary admission?: Yes Referral Source: Self/Family/Friend Insurance type: Self-pay     Crisis Care Plan Living Arrangements: Spouse/significant other, Children Legal Guardian: Other:(Self) Name of Psychiatrist: None Name of Therapist: None  Education Status Is patient currently in school?: No Is the patient employed, unemployed or receiving disability?: Unemployed  Risk to self with the past 6 months Suicidal Ideation: Yes-Currently Present Has patient been a risk to self within the past 6 months prior to admission? : No Suicidal Intent: No Has patient had any suicidal intent within the past 6 months prior to admission? : No Is patient at risk for suicide?: No Suicidal Plan?: No Has patient had any suicidal plan within the past 6 months prior to admission? : No Access to Means: No What has been your use of drugs/alcohol within the last 12 months?: Pt denies Previous Attempts/Gestures: No How many times?: 0 Other Self Harm Risks: None Triggers for Past Attempts: None known Intentional Self Injurious Behavior: None Family Suicide History: No Recent stressful life event(s): Financial Problems, Other (Comment)(Family members abuse substances) Persecutory voices/beliefs?: No Depression: Yes Depression  Symptoms: Despondent, Tearfulness, Isolating, Fatigue, Guilt, Loss of interest in usual pleasures, Feeling worthless/self pity, Feeling angry/irritable Substance abuse history and/or treatment for substance abuse?: No Suicide prevention information given to non-admitted patients: Not applicable  Risk to Others within the past 6 months Homicidal Ideation: No Does patient have any lifetime risk of violence toward others beyond the six months prior to admission? : No Thoughts of Harm to Others: No Current Homicidal Intent: No Current Homicidal Plan: No Access to Homicidal Means: No Identified Victim: None History of harm to others?: No Assessment of Violence: None Noted Violent Behavior Description: Pt denies history of violence Does patient have access to weapons?: Yes (Comment)(Pt has rifles in a gun safe) Criminal Charges Pending?: No Does patient have a court date: No Is patient on probation?: No  Psychosis Hallucinations: None noted Delusions: None noted  Mental Status Report Appearance/Hygiene: In scrubs Eye Contact: Good Motor Activity: Freedom of movement Speech: Logical/coherent Level of Consciousness: Alert, Crying Mood: Depressed, Anxious Affect: Depressed, Anxious Anxiety Level: Severe Thought Processes: Coherent, Relevant Judgement: Partial Orientation: Person, Place, Time, Situation Obsessive Compulsive Thoughts/Behaviors: None  Cognitive Functioning Concentration: Normal Memory: Recent Intact, Remote Intact Is patient IDD: No Insight: Fair Impulse Control: Fair Appetite: Poor Have you had any weight changes? : Loss Amount of the weight change? (lbs): (Unknown) Sleep: No Change Total Hours of Sleep: 10 Vegetative Symptoms: None  ADLScreening [facility] Assessment Services) Patient's cognitive ability adequate to safely complete daily activities?: Yes Patient able to express need for  assistance with ADLs?: Yes Independently performs ADLs?: Yes (appropriate  for developmental age)  Prior Inpatient Therapy Prior Inpatient Therapy: Yes Prior Therapy Dates: 2011 [facility] Reason for Treatment: Depression  Prior Outpatient Therapy Prior Outpatient Therapy: No Does patient have an ACCT team?: No Does patient have Intensive In-House Services?  : No Does patient have [facility] services? : No Does patient have P4CC services?: No  ADL Screening (condition at time of admission) Patient's cognitive ability adequate to safely complete daily activities?: Yes Is the patient deaf or have difficulty hearing?: No Does the patient have difficulty seeing, even when wearing glasses/contacts?: No Does the patient have difficulty concentrating, remembering, or making decisions?: No Patient able to express need for assistance with ADLs?: Yes Does the patient have difficulty dressing or bathing?: No Independently performs ADLs?: Yes (appropriate for developmental age) Does the patient have difficulty walking or climbing stairs?: No Weakness of Legs: None Weakness of Arms/Hands: None  Home Assistive Devices/Equipment Home Assistive Devices/Equipment: None    Abuse/Neglect Assessment (Assessment to be complete while patient is alone) Abuse/Neglect Assessment Can Be Completed: Yes Physical Abuse: Denies Verbal Abuse: Denies Sexual Abuse: Denies Exploitation of patient/patient's resources: Denies Self-Neglect: Denies     Merchant navy officerAdvance Directives (For Healthcare) Does Patient Have a Medical Advance Directive?: No Would patient like information on creating a medical advance directive?: No - Patient declined          Disposition: Gave clinical report to Nira ConnJason Berry, FNP who recommends Pt be observed overnight and evaluated by psychiatry in the morning.  Disposition Initial Assessment Completed for this Encounter: Yes  This service was provided via telemedicine using a 2-way, interactive audio and video  technology.  Names of all persons participating in this telemedicine service and their role in this encounter. Name: Marie Diaz Role: Patient  Name: Marie Diaz, [facility] Role: TTS counselor         Marie Diaz, [facility], [facility] Triage Specialist [phone]  Marie Diaz, Durand Wittmeyer Ellis 06/11/2019 11:54 PM

## 2019-06-12 LAB — RESPIRATORY PANEL BY RT PCR (FLU A&B, COVID)
Influenza A by PCR: NEGATIVE
Influenza B by PCR: NEGATIVE
SARS Coronavirus 2 by RT PCR: NEGATIVE

## 2019-06-12 NOTE — Consult Note (Signed)
  Patient seen and case discussed. Patient is alert and oriented, calm and cooperative. She continues to endorse family dysfunction and psychosocial dynamics that are the major cause for her ER visit. She is interested in receiving outpatient resources. She denies suicidal ideations and or any previous suicide attempts. She has a therapist appointment scheduled for Monday at 11am. Discussed with patient therapuetic options to include her current dose of Lexapro vs Effexor. She denies si/hi/avh. At this time will psychiatrically clear patient. Will order sw consult for outpatient resources for [facility].

## 2019-06-12 NOTE — Discharge Instructions (Addendum)
Follow-up as instructed by behavioral health.  You have been referred to [facility]

## 2019-08-17 ENCOUNTER — Other Ambulatory Visit: Payer: Self-pay

## 2019-08-17 ENCOUNTER — Encounter: Payer: Self-pay | Admitting: Gastroenterology

## 2019-08-17 ENCOUNTER — Ambulatory Visit: Payer: Self-pay | Admitting: Gastroenterology

## 2019-08-17 DIAGNOSIS — R1013 Epigastric pain: Secondary | ICD-10-CM | POA: Insufficient documentation

## 2019-08-17 MED ORDER — LIDOCAINE VISCOUS HCL 2 % MT SOLN: OROMUCOSAL | 5 refills | Status: DC

## 2019-08-17 NOTE — Addendum Note (Signed)
Addended by: [place] on: 08/17/2019 10:52 AM   Modules accepted: Orders

## 2019-08-17 NOTE — Patient Instructions (Addendum)
DRINK WATER TO KEEP YOUR URINE LIGHT YELLOW.  FOLLOW A SOFT MECHANICAL DIET.  MEATS SHOULD BE GROUND ONLY. FRUITS AND VEGGIES SHOULD Be SOFT LIKE MASHED POTATOES.   To reduce abdminal pain,  USE VISCOUS LIDOCAINE 2 TSP EVERY 4 HOURS WHEN NEEDED FOR FLARES OF HEARTBURN, CHEST PAIN, OR UPPER ABDOMINAL PAIN. USE A SYRINGE TO INJECT INTO THE BACK OF YOUR THROAT. USE NO MORE THAN 8 DOSES A DAY. IT WILL MAKE YOUR MOUTH, ESOPHAGUS, AND STOMACH NUMB.  CONTINUE PROTONIX. TAKE 30 MINUTES PRIOR TO MEALS TWICE DAILY.   COMPLETE  UPPER ENDOSCOPY.  FOLLOW UP IN 4 MOS.   SOFT MECHANICAL DIET This SOFT MECHANICAL DIET is restricted to:  Foods that are moist, soft-textured, and easy to chew and swallow.   Meats that are ground or are minced no larger than one-quarter inch pieces. Meats are moist with gravy or sauce added.   Foods that do not include bread or bread-like textures except soft pancakes, well-moistened with syrup or sauce.   Textures with some chewing ability required.   Casseroles without rice.   Cooked vegetables that are less than half an inch in size and easily mashed with a fork. No cooked corn, peas, broccoli, cauliflower, cabbage, Brussels sprouts, asparagus, or other fibrous, non-tender or rubbery cooked vegetables.   Canned fruit except for pineapple. Fruit must be cut into pieces no larger than half an inch in size.   Foods that do not include nuts, seeds, coconut, or sticky textures.   FOOD TEXTURES FOR DYSPHAGIA DIET LEVEL 2 -SOFT MECHANICAL DIET (includes all foods on Dysphagia Diet Level 1 - Pureed, in addition to the foods listed below)  FOOD GROUP: Breads. RECOMMENDED: Soft pancakes, well-moistened with syrup or sauce.  AVOID: All others.  FOOD GROUP: Cereals.  RECOMMENDED: Cooked cereals with little texture, including oatmeal. Unprocessed wheat bran stirred into cereals for bulk. Note: If thin liquids are restricted, it is important that all of the liquid is  absorbed into the cereal.  AVOID: All dry cereals and any cooked cereals that may contain flax seeds or other seeds or nuts. Whole-grain, dry, or coarse cereals. Cereals with nuts, seeds, dried fruit, and/or coconut.  FOOD GROUP: Desserts. RECOMMENDED: Pudding, custard. Soft fruit pies with bottom crust only. Canned fruit (excluding pineapple). Soft, moist cakes with icing.Frozen malts, milk shakes, frozen yogurt, eggnog, nutritional supplements, ice cream, sherbet, regular or sugar-free gelatin, or any foods that become thin liquid at either room (70 F) or body temperature (98 F).  AVOID: Dry, coarse cakes and cookies. Anything with nuts, seeds, coconut, pineapple, or dried fruit. Breakfast yogurt with nuts. Rice or bread pudding.  FOOD GROUP: Fats. RECOMMENDED: Butter, margarine, cream for cereal (depending on liquid consistency recommendations), gravy, cream sauces, sour cream, sour cream dips with soft additives, mayonnaise, salad dressings, cream cheese, cream cheese spreads with soft additives, whipped toppings.  AVOID: All fats with coarse or chunky additives.  FOOD GROUP: Fruits. RECOMMENDED: Soft drained, canned, or cooked fruits without seeds or skin. Fresh soft and ripe banana. Fruit juices with a small amount of pulp. If thin liquids are restricted, fruit juices should be thickened to appropriate consistency.  AVOID: Fresh or frozen fruits. Cooked fruit with skin or seeds. Dried fruits. Fresh, canned, or cooked pineapple.  FOOD GROUP: Meats and Meat Substitutes. (Meat pieces should not exceed 1/4 of an inch cube and should be tender.) RECOMMENDED: Moistened ground or cooked meat, poultry, or fish. Moist ground or tender meat may be served  with gravy or sauce. Casseroles without rice. Moist macaroni and cheese, well-cooked pasta with meat sauce, tuna noodle casserole, soft, moist lasagna. Moist meatballs, meatloaf, or fish loaf. Protein salads, such as tuna or egg without large  chunks, celery, or onion. Cottage cheese, smooth quiche without large chunks. Poached, scrambled, or soft-cooked eggs (egg yolks should not be "runny" but should be moist and able to be mashed with butter, margarine, or other moisture added to them). (Cook eggs to 160 F or use pasteurized eggs for safety.) Souffls may have small, soft chunks. Tofu. Well-cooked, slightly mashed, moist legumes, such as baked beans. All meats or protein substitutes should be served with sauces or moistened to help maintain cohesiveness in the oral cavity.  AVOID: Dry meats, tough meats (such as bacon, sausage, hot dogs, bratwurst). Dry casseroles or casseroles with rice or large chunks. Peanut butter. Cheese slices and cubes. Hard-cooked or crisp fried eggs. Sandwiches.Pizza.  FOOD GROUP: Potatoes and Starches. RECOMMENDED: Well-cooked, moistened, boiled, baked, or mashed potatoes. Well-cooked shredded hash brown potatoes that are not crisp. (All potatoes need to be moist and in sauces.)Well-cooked noodles in sauce. Spaetzel or soft dumplings that have been moistened with butter or gravy.  AVOID: Potato skins and chips. Fried or French-fried potatoes. Rice.  FOOD GROUP: Soups. RECOMMENDED: Soups with easy-to-chew or easy-to-swallow meats or vegetables: Particle sizes in soups should be less than 1/2 inch. Soups will need to be thickened to appropriate consistency if soup is thinner than prescribed liquid consistency.  AVOID: Soups with large chunks of meat and vegetables. Soups with rice, corn, peas.  FOOD GROUP: Vegetables. RECOMMENDED: All soft, well-cooked vegetables. Vegetables should be less than a half inch. Should be easily mashed with a fork.  AVOID: Cooked corn and peas. Broccoli, cabbage, Brussels sprouts, asparagus, or other fibrous, non-tender or rubbery cooked vegetables.  FOOD GROUP: Miscellaneous. RECOMMENDED: Jams and preserves without seeds, jelly. Sauces, salsas, etc., that may have small tender  chunks less than 1/2 inch. Soft, smooth chocolate bars that are easily chewed.  AVOID: Seeds, nuts, coconut, or sticky foods. Chewy candies such as caramels or licorice.

## 2019-08-17 NOTE — Progress Notes (Signed)
Subjective:    Patient ID: Marie Diaz, female    DOB: [birth year], 54 y.o.   MRN: [id number]  Rebekah Chesterfield, NP   HPI Weight loss: 115 lbs to 108 lbs. HAD UPPER ABDOMINAL DISCOMFORT FOR 4 MOS. EVERYTHING SHE'S TRIED IT HASN'T HELPED:PROTONIX, MYLANTA, PEPCID(NO HELP). BURNING PAIN ALL THE TIME. BURNS FROM UNDER RIBS INTO MID CHEST(BRUNING). HAS HEAVINESS UNDER HER RIBS.  ETOH: NO. TAKES ASA DAILY. NO BC/GOODY POWDERS, IBUPROFEN/MOTRIN, OR NAPROXEN/ALEVE. DRINKING A LO OF MILK AND NOT GOING TO BATHROOM THAT MUCH. ONE DAY L;AST WEEK COULDN'T SWALLOW AND STARTED ON YOGURT. LAST YEAR HAD TROUBLE(SUMMERTIME). GB OUT DUE TO VOMITING. SMOKES 1PK/DAY SINCE AGE 31.  PT DENIES FEVER, CHILLS, HEMATOCHEZIA, HEMATEMESIS, nausea, vomiting, melena, diarrhea, SHORTNESS OF BREATH, CHANGE IN BOWEL IN HABITS, constipation, problems swallowing, problems with sedation, OR heartburn or indigestion.  Past Medical History:  Diagnosis Date  . Depression   . Hypertension    Past Surgical History:  Procedure Laterality Date  . ABDOMINAL HYSTERECTOMY    . CHOLECYSTECTOMY    . HEMORRHOID SURGERY      Allergies  Allergen Reactions  . Tetracyclines & Related   . Ultram [Tramadol] Other (See Comments)    "knocks me out"   Current Outpatient Medications  Medication Sig    . ALPRAZolam (XANAX) 1 MG tablet Take 2 mg by mouth at bedtime.     Marland Kitchen amLODipine (NORVASC) 10 MG tablet Take 10 mg by mouth daily.     Marland Kitchen aspirin EC 81 MG tablet Take 81 mg by mouth daily.    . cloNIDine (CATAPRES) 0.2 MG tablet Take 0.2 mg by mouth daily.    Marland Kitchen escitalopram (LEXAPRO) 20 MG tablet Take 20 mg by mouth daily.    Marland Kitchen lisinopril (ZESTRIL) 20 MG tablet Take 20 mg by mouth daily.     . Multiple Vitamin (MULTIVITAMIN WITH MINERALS) TABS tablet Take 1 tablet by mouth daily.    . pantoprazole (PROTONIX) 40 MG tablet Take 40 mg by mouth 2 (two) times daily.    .      .      .      .       Family History  Problem Relation  Age of Onset  . Colon cancer Neg Hx   . Colon polyps Neg Hx     Social History   Socioeconomic History  . Marital status: Married    Spouse name: Not on file  . Number of children: Not on file  . Years of education: Not on file  . Highest education level: Not on file  Occupational History  . Not on file  Tobacco Use  . Smoking status: Current Every Day Smoker    Packs/day: 0.50    Types: Cigarettes  . Smokeless tobacco: Never Used  Substance and Sexual Activity  . Alcohol use: No  . Drug use: No  . Sexual activity: Not on file  Other Topics Concern  . Not on file  Social History Narrative   [phone]), kids(2). 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 SHE'S AN ONLY CHILD.   Social Determinants of Health   Financial Resource Strain:   . Difficulty of Paying Living Expenses: Not on file  Food Insecurity:   . Worried About Programme researcher, broadcasting/film/video in the Last Year: Not on file  . Ran Out of Food in the Last Year: Not on file  Transportation Needs:   .  Lack of Transportation (Medical): Not on file  . Lack of Transportation (Non-Medical): Not on file  Physical Activity:   . Days of Exercise per Week: Not on file  . Minutes of Exercise per Session: Not on file  Stress:   . Feeling of Stress : Not on file  Social Connections:   . Frequency of Communication with Friends and Family: Not on file  . Frequency of Social Gatherings with Friends and Family: Not on file  . Attends Religious Services: Not on file  . Active Member of Clubs or Organizations: Not on file  . Attends Club or Organization Meetings: Not on file  . Marital Status: Not on file   Review of Systems PER HPI OTHERWISE ALL SYSTEMS ARE NEGATIVE.    Objective:   Physical Exam Constitutional:      General: She is not in acute distress.    Appearance: Normal appearance.  HENT:     Mouth/Throat:     Comments: MASK IN PLACE Eyes:     General: No scleral  icterus.    Pupils: Pupils are equal, round, and reactive to light.  Cardiovascular:     Rate and Rhythm: Normal rate and regular rhythm.     Pulses: Normal pulses.     Heart sounds: Normal heart sounds.  Pulmonary:     Effort: Pulmonary effort is normal.     Breath sounds: Normal breath sounds.  Abdominal:     General: Bowel sounds are normal.     Palpations: Abdomen is soft.     Tenderness: There is abdominal tenderness. There is no guarding or rebound.     Comments: MILD TTP IN THE EPIGASTRIUM   Musculoskeletal:     Cervical back: Normal range of motion.     Right lower leg: No edema.     Left lower leg: No edema.  Lymphadenopathy:     Cervical: No cervical adenopathy.  Skin:    General: Skin is warm and dry.  Neurological:     Mental Status: She is alert and oriented to person, place, and time.     Comments: NO  NEW FOCAL DEFICITS  Psychiatric:        Mood and Affect: Mood normal.     Comments: NORMAL AFFECT       Assessment & Plan:   

## 2019-08-17 NOTE — H&P (View-Only) (Signed)
Subjective:    Patient ID: Marie Diaz, female    DOB: [birth year], 54 y.o.   MRN: [id number]  Rebekah Chesterfield, NP   HPI Weight loss: 115 lbs to 108 lbs. HAD UPPER ABDOMINAL DISCOMFORT FOR 4 MOS. EVERYTHING SHE'S TRIED IT HASN'T HELPED:PROTONIX, MYLANTA, PEPCID(NO HELP). BURNING PAIN ALL THE TIME. BURNS FROM UNDER RIBS INTO MID CHEST(BRUNING). HAS HEAVINESS UNDER HER RIBS.  ETOH: NO. TAKES ASA DAILY. NO BC/GOODY POWDERS, IBUPROFEN/MOTRIN, OR NAPROXEN/ALEVE. DRINKING A LO OF MILK AND NOT GOING TO BATHROOM THAT MUCH. ONE DAY L;AST WEEK COULDN'T SWALLOW AND STARTED ON YOGURT. LAST YEAR HAD TROUBLE(SUMMERTIME). GB OUT DUE TO VOMITING. SMOKES 1PK/DAY SINCE AGE 31.  PT DENIES FEVER, CHILLS, HEMATOCHEZIA, HEMATEMESIS, nausea, vomiting, melena, diarrhea, SHORTNESS OF BREATH, CHANGE IN BOWEL IN HABITS, constipation, problems swallowing, problems with sedation, OR heartburn or indigestion.  Past Medical History:  Diagnosis Date  . Depression   . Hypertension    Past Surgical History:  Procedure Laterality Date  . ABDOMINAL HYSTERECTOMY    . CHOLECYSTECTOMY    . HEMORRHOID SURGERY      Allergies  Allergen Reactions  . Tetracyclines & Related   . Ultram [Tramadol] Other (See Comments)    "knocks me out"   Current Outpatient Medications  Medication Sig    . ALPRAZolam (XANAX) 1 MG tablet Take 2 mg by mouth at bedtime.     Marland Kitchen amLODipine (NORVASC) 10 MG tablet Take 10 mg by mouth daily.     Marland Kitchen aspirin EC 81 MG tablet Take 81 mg by mouth daily.    . cloNIDine (CATAPRES) 0.2 MG tablet Take 0.2 mg by mouth daily.    Marland Kitchen escitalopram (LEXAPRO) 20 MG tablet Take 20 mg by mouth daily.    Marland Kitchen lisinopril (ZESTRIL) 20 MG tablet Take 20 mg by mouth daily.     . Multiple Vitamin (MULTIVITAMIN WITH MINERALS) TABS tablet Take 1 tablet by mouth daily.    . pantoprazole (PROTONIX) 40 MG tablet Take 40 mg by mouth 2 (two) times daily.    .      .      .      .       Family History  Problem Relation  Age of Onset  . Colon cancer Neg Hx   . Colon polyps Neg Hx     Social History   Socioeconomic History  . Marital status: Married    Spouse name: Not on file  . Number of children: Not on file  . Years of education: Not on file  . Highest education level: Not on file  Occupational History  . Not on file  Tobacco Use  . Smoking status: Current Every Day Smoker    Packs/day: 0.50    Types: Cigarettes  . Smokeless tobacco: Never Used  Substance and Sexual Activity  . Alcohol use: No  . Drug use: No  . Sexual activity: Not on file  Other Topics Concern  . Not on file  Social History Narrative   [phone]), kids(2). 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 SHE'S AN ONLY CHILD.   Social Determinants of Health   Financial Resource Strain:   . Difficulty of Paying Living Expenses: Not on file  Food Insecurity:   . Worried About Programme researcher, broadcasting/film/video in the Last Year: Not on file  . Ran Out of Food in the Last Year: Not on file  Transportation Needs:   .  Lack of Transportation (Medical): Not on file  . Lack of Transportation (Non-Medical): Not on file  Physical Activity:   . Days of Exercise per Week: Not on file  . Minutes of Exercise per Session: Not on file  Stress:   . Feeling of Stress : Not on file  Social Connections:   . Frequency of Communication with Friends and Family: Not on file  . Frequency of Social Gatherings with Friends and Family: Not on file  . Attends Religious Services: Not on file  . Active Member of Clubs or Organizations: Not on file  . Attends Archivist Meetings: Not on file  . Marital Status: Not on file   Review of Systems PER HPI OTHERWISE ALL SYSTEMS ARE NEGATIVE.    Objective:   Physical Exam Constitutional:      General: She is not in acute distress.    Appearance: Normal appearance.  HENT:     Mouth/Throat:     Comments: MASK IN PLACE Eyes:     General: No scleral  icterus.    Pupils: Pupils are equal, round, and reactive to light.  Cardiovascular:     Rate and Rhythm: Normal rate and regular rhythm.     Pulses: Normal pulses.     Heart sounds: Normal heart sounds.  Pulmonary:     Effort: Pulmonary effort is normal.     Breath sounds: Normal breath sounds.  Abdominal:     General: Bowel sounds are normal.     Palpations: Abdomen is soft.     Tenderness: There is abdominal tenderness. There is no guarding or rebound.     Comments: MILD TTP IN THE EPIGASTRIUM   Musculoskeletal:     Cervical back: Normal range of motion.     Right lower leg: No edema.     Left lower leg: No edema.  Lymphadenopathy:     Cervical: No cervical adenopathy.  Skin:    General: Skin is warm and dry.  Neurological:     Mental Status: She is alert and oriented to person, place, and time.     Comments: NO  NEW FOCAL DEFICITS  Psychiatric:        Mood and Affect: Mood normal.     Comments: NORMAL AFFECT       Assessment & Plan:

## 2019-08-17 NOTE — Assessment & Plan Note (Addendum)
SYMPTOMS NOT IDEALLY CONTROLLED associated with weight loss. DIFFERENTIAL DIAGNOSIS INCLUDES: H PYLORI GASTRITIS, LESS LIKELY CELIAC SPRUE, PUD, OR OCCULT ESOPHAGEAL CANCER.  DRINK WATER TO KEEP YOUR URINE LIGHT YELLOW. FOLLOW A SOFT MECHANICAL DIET.  MEATS SHOULD BE GROUND ONLY. FRUITS AND VEGGIES SHOULD BE SOFT LIKE MASHED POTATOES. CONTINUE PROTONIX. TAKE 30 MINUTES PRIOR TO MEALS TWICE DAILY. COMPLETE  UPPER ENDOSCOPY.  DISCUSSED PROCEDURE, BENEFITS, & RISKS: < 1% chance of medication reaction, bleeding, perforation, or ASPIRATION. PHENERGAN 6.25 MG IV IN PREOP. HOLD XANAX THE NIGHT BEFORE. TAKE BP MEDS IN AM. HOLD LEXAPRO. FOLLOW UP IN 4 MOS.

## 2019-08-26 ENCOUNTER — Other Ambulatory Visit: Payer: Self-pay

## 2019-08-26 ENCOUNTER — Other Ambulatory Visit (HOSPITAL_COMMUNITY)
Admission: RE | Admit: 2019-08-26 | Discharge: 2019-08-26 | Disposition: A | Discharge status: Home / Self Care | Payer: HRSA Program | Source: Ambulatory Visit | Attending: Gastroenterology | Admitting: Gastroenterology

## 2019-08-26 DIAGNOSIS — Z20822 Contact with and (suspected) exposure to covid-19: Secondary | ICD-10-CM | POA: Diagnosis not present

## 2019-08-26 DIAGNOSIS — Z01812 Encounter for preprocedural laboratory examination: Secondary | ICD-10-CM | POA: Diagnosis present

## 2019-08-26 LAB — SARS CORONAVIRUS 2 (TAT 6-24 HRS): SARS Coronavirus 2: NEGATIVE

## 2019-08-29 ENCOUNTER — Telehealth: Payer: Self-pay | Admitting: Gastroenterology

## 2019-08-29 ENCOUNTER — Ambulatory Visit (HOSPITAL_COMMUNITY)
Admission: RE | Admit: 2019-08-29 | Discharge: 2019-08-29 | Disposition: A | Discharge status: Home / Self Care | Payer: Self-pay | Attending: Gastroenterology | Admitting: Gastroenterology

## 2019-08-29 ENCOUNTER — Encounter (HOSPITAL_COMMUNITY): Payer: Self-pay | Admitting: Gastroenterology

## 2019-08-29 ENCOUNTER — Encounter (HOSPITAL_COMMUNITY)
Admission: RE | Disposition: A | Discharge status: Home / Self Care | Payer: Self-pay | Source: Home / Self Care | Attending: Gastroenterology

## 2019-08-29 ENCOUNTER — Other Ambulatory Visit: Payer: Self-pay

## 2019-08-29 DIAGNOSIS — R1033 Periumbilical pain: Secondary | ICD-10-CM

## 2019-08-29 DIAGNOSIS — Z7982 Long term (current) use of aspirin: Secondary | ICD-10-CM | POA: Insufficient documentation

## 2019-08-29 DIAGNOSIS — Z79899 Other long term (current) drug therapy: Secondary | ICD-10-CM | POA: Insufficient documentation

## 2019-08-29 DIAGNOSIS — K2950 Unspecified chronic gastritis without bleeding: Secondary | ICD-10-CM | POA: Insufficient documentation

## 2019-08-29 DIAGNOSIS — Z681 Body mass index (BMI) 19 or less, adult: Secondary | ICD-10-CM | POA: Insufficient documentation

## 2019-08-29 DIAGNOSIS — F17210 Nicotine dependence, cigarettes, uncomplicated: Secondary | ICD-10-CM | POA: Insufficient documentation

## 2019-08-29 DIAGNOSIS — F329 Major depressive disorder, single episode, unspecified: Secondary | ICD-10-CM | POA: Insufficient documentation

## 2019-08-29 DIAGNOSIS — R634 Abnormal weight loss: Secondary | ICD-10-CM | POA: Insufficient documentation

## 2019-08-29 DIAGNOSIS — K2970 Gastritis, unspecified, without bleeding: Secondary | ICD-10-CM

## 2019-08-29 DIAGNOSIS — I10 Essential (primary) hypertension: Secondary | ICD-10-CM | POA: Insufficient documentation

## 2019-08-29 HISTORY — PX: ESOPHAGOGASTRODUODENOSCOPY: SHX5428

## 2019-08-29 HISTORY — PX: BIOPSY: SHX5522

## 2019-08-29 SURGERY — EGD (ESOPHAGOGASTRODUODENOSCOPY)
Anesthesia: Moderate Sedation

## 2019-08-29 MED ORDER — PROMETHAZINE HCL 25 MG/ML IJ SOLN
INTRAMUSCULAR | Status: DC | PRN
  Administered 2019-08-29: 12.5 mg via INTRAVENOUS

## 2019-08-29 MED ORDER — LIDOCAINE VISCOUS HCL 2 % MT SOLN
OROMUCOSAL | Status: AC
  Filled 2019-08-29: qty 15

## 2019-08-29 MED ORDER — MEPERIDINE HCL 100 MG/ML IJ SOLN
INTRAMUSCULAR | Status: DC | PRN
  Administered 2019-08-29 (×2): 25 mg via INTRAVENOUS
  Administered 2019-08-29: 50 mg via INTRAVENOUS

## 2019-08-29 MED ORDER — LIDOCAINE VISCOUS HCL 2 % MT SOLN
OROMUCOSAL | Status: DC | PRN
  Administered 2019-08-29: 1 via OROMUCOSAL

## 2019-08-29 MED ORDER — MEPERIDINE HCL 100 MG/ML IJ SOLN
INTRAMUSCULAR | Status: AC
  Filled 2019-08-29: qty 2

## 2019-08-29 MED ORDER — SODIUM CHLORIDE 0.9 % IV SOLN: INTRAVENOUS | Status: DC

## 2019-08-29 MED ORDER — MIDAZOLAM HCL 5 MG/5ML IJ SOLN
INTRAMUSCULAR | Status: AC
  Filled 2019-08-29: qty 10

## 2019-08-29 MED ORDER — MIDAZOLAM HCL 5 MG/5ML IJ SOLN
INTRAMUSCULAR | Status: DC | PRN
  Administered 2019-08-29 (×4): 2 mg via INTRAVENOUS

## 2019-08-29 MED ORDER — PROMETHAZINE HCL 25 MG/ML IJ SOLN
INTRAMUSCULAR | Status: AC
  Filled 2019-08-29: qty 1

## 2019-08-29 MED ORDER — SODIUM CHLORIDE FLUSH 0.9 % IV SOLN
INTRAVENOUS | Status: AC
  Filled 2019-08-29: qty 10

## 2019-08-29 MED ORDER — PROMETHAZINE HCL 25 MG/ML IJ SOLN
6.2500 mg | Freq: Once | INTRAMUSCULAR | Status: AC
  Administered 2019-08-29: 6.25 mg via INTRAVENOUS

## 2019-08-29 NOTE — Addendum Note (Signed)
Addended by: Corrie Mckusick on: 08/29/2019 02:02 PM   Modules accepted: Orders

## 2019-08-29 NOTE — Op Note (Signed)
[facility] Patient Name: Marie Diaz Procedure Date: 08/29/2019 11:44 AM MRN: [id number] Date of Birth: 06/22/1966 Attending MD: Jonette Eva MD, MD CSN: [id number] Age: 54 Admit Type: Outpatient Procedure:                Upper GI endoscopy WITH COLD FORCEPS BIOPSY Indications:              Periumbilical abdominal pain, Dyspepsia Providers:                Jonette Eva MD, MD, Loma Messing B. Patsy Lager, RN, Dyann Ruddle Referring MD:             [place] Dickey Medicines:                Promethazine 18.75 mg IV, Meperidine 100 mg IV,                            Midazolam 8 mg IV Complications:            No immediate complications. Estimated Blood Loss:     Estimated blood loss was minimal. Procedure:                Pre-Anesthesia Assessment:                           - Prior to the procedure, a History and Physical                            was performed, and patient medications and                            allergies were reviewed. The patient's tolerance of                            previous anesthesia was also reviewed. The risks                            and benefits of the procedure and the sedation                            options and risks were discussed with the patient.                            All questions were answered, and informed consent                            was obtained. Prior Anticoagulants: The patient has                            taken no previous anticoagulant or antiplatelet                            agents except for aspirin. ASA Grade Assessment: II                            -  A patient with mild systemic disease. After                            reviewing the risks and benefits, the patient was                            deemed in satisfactory condition to undergo the                            procedure. After obtaining informed consent, the                            endoscope was passed under direct vision.                             Throughout the procedure, the patient's blood                            pressure, pulse, and oxygen saturations were                            monitored continuously. The GIF-H190 ([id number]) was                            introduced through the mouth, and advanced to the                            second part of duodenum. The upper GI endoscopy was                            performed with difficulty. Successful completion of                            the procedure was aided by increasing the dose of                            sedation medication. The patient tolerated the                            procedure fairly well. Scope In: 12:30:39 PM Scope Out: 12:34:51 PM Total Procedure Duration: 0 hours 4 minutes 12 seconds  Findings:      The examined esophagus was normal.      Localized moderate inflammation characterized by adherent blood,       congestion (edema), erosions and friability was found on the greater       curvature of the stomach and in the gastric antrum.       Biopsies(3;BODY,1INCISURA,2:ANTRUM) were taken with a cold forceps for       Helicobacter pylori testing.      The examined duodenum was normal. Impression:               - DYSPEPSIA DUE TO Gastritis, LESS LIKELY OCCULT  MALIGNANCY. Biopsied. Moderate Sedation:      Moderate (conscious) sedation was administered by the endoscopy nurse       and supervised by the endoscopist. The following parameters were       monitored: oxygen saturation, heart rate, blood pressure, and response       to care. Total physician intraservice time was 25 minutes. Recommendation:           - Patient has a contact number available for                            emergencies. The signs and symptoms of potential                            delayed complications were discussed with the                            patient. Return to normal activities tomorrow.                             Written discharge instructions were provided to the                            patient.                           - Low fat diet.                           - Continue present medications. PPI BID. VISCOUS                            LIDOCAINE PRN. COMPLET CT SCAN IN 7 DAYS.                           - Await pathology results.                           - Return to GI office in 4 months. Procedure Code(s):        --- Professional ---                           [phone], Esophagogastroduodenoscopy, flexible,                            transoral; with biopsy, single or multiple                           99153, Moderate sedation; each additional 15                            minutes intraservice time                           G0500, Moderate sedation services provided by the  same physician or other qualified health care                            professional performing a gastrointestinal                            endoscopic service that sedation supports,                            requiring the presence of an independent trained                            observer to assist in the monitoring of the                            patient's level of consciousness and physiological                            status; initial 15 minutes of intra-service time;                            patient age 34 years or older (additional time may                            be reported with 21194, as appropriate) Diagnosis Code(s):        --- Professional ---                           K29.70, Gastritis, unspecified, without bleeding                           R10.33, Periumbilical pain                           R10.13, Epigastric pain CPT copyright 2019 American Medical Association. All rights reserved. The codes documented in this report are preliminary and upon coder review may  be revised to meet current compliance requirements. Jonette Eva, MD Jonette Eva MD, MD 08/29/2019 12:49:03  PM This report has been signed electronically. Number of Addenda: 0

## 2019-08-29 NOTE — Telephone Encounter (Signed)
PT NEEDS CT ASAP FOR ABDOMINAL PAIN/WEIGHT LOSS.

## 2019-08-29 NOTE — Telephone Encounter (Signed)
CT scheduled for 09/01/19 at 4:30pm, arrive at 3:45pm for lab work. NPO 4 hours prior to test. Pickup contrast prior to test.  Tried to call pt, no answer mobile or home number, LMOVM and LMOAM for return call.

## 2019-08-29 NOTE — Interval H&P Note (Signed)
History and Physical Interval Note:  08/29/2019 12:07 PM  Marie Diaz  has presented today for surgery, with the diagnosis of dyspepsia, weight loss.  The various methods of treatment have been discussed with the patient and family. After consideration of risks, benefits and other options for treatment, the patient has consented to  Procedure(s) with comments: ESOPHAGOGASTRODUODENOSCOPY (EGD) (N/A) - 11:30am as a surgical intervention.  The patient's history has been reviewed, patient examined, no change in status, stable for surgery.  I have reviewed the patient's chart and labs.  Questions were answered to the patient's satisfaction.     [employer]

## 2019-08-29 NOTE — Telephone Encounter (Signed)
Lab order faxed to [facility] lab.  Pt called office, informed of CT appt.

## 2019-08-29 NOTE — Discharge Instructions (Signed)
You have gastritis due to aspirin. YOUR SMALL BOWEL LOOKED NORMAL. I biopsied your stomach.    DRINK WATER TO KEEP YOUR URINE LIGHT YELLOW.  FOLLOW A LOW FAT DIET. MEATS SHOULD BE BAKED, BROILED, OR BOILED. AVOID FRIED FOODS.    CONTINUE PROTONIX. TAKE 30 MINUTES PRIOR TO MEALS TWICE DAILY.  USE VISCOUS LIDOCAINE 2 TSP EVERY 4 HOURS WHEN NEEDED FOR FLARES OF HEARTBURN, CHEST PAIN, OR UPPER ABDOMINAL PAIN. USE A SYRINGE TO INJECT INTO THE BACK OF YOUR THROAT. USE NO MORE THAN 8 DOSES A DAY. IT WILL MAKE YOUR MOUTH, ESOPHAGUS, AND STOMACH NUMB   YOUR BIOPSY RESULTS WILL BE BACK IN 5 BUSINESS DAYS.  Complete CT scan of abdomen and pelvis in next 7 days.  FOLLOW UP IN 4 MOS.  UPPER ENDOSCOPY AFTER CARE Read the instructions outlined below and refer to this sheet in the next week. These discharge instructions provide you with general information on caring for yourself after you leave the hospital. While your treatment has been planned according to the most current medical practices available, unavoidable complications occasionally occur. If you have any problems or questions after discharge, call DR. FIELDS, [phone].  ACTIVITY  You may resume your regular activity, but move at a slower pace for the next 24 hours.   Take frequent rest periods for the next 24 hours.   Walking will help get rid of the air and reduce the bloated feeling in your belly (abdomen).   No driving for 24 hours (because of the medicine (anesthesia) used during the test).   You may shower.   Do not sign any important legal documents or operate any machinery for 24 hours (because of the anesthesia used during the test).    NUTRITION  Drink plenty of fluids.   You may resume your normal diet as instructed by your doctor.   Begin with a light meal and progress to your normal diet. Heavy or fried foods are harder to digest and may make you feel sick to your stomach (nauseated).   Avoid alcoholic  beverages for 24 hours or as instructed.    MEDICATIONS  You may resume your normal medications.   WHAT YOU CAN EXPECT TODAY  Some feelings of bloating in the abdomen.   Passage of more gas than usual.    IF YOU HAD A BIOPSY TAKEN DURING THE UPPER ENDOSCOPY:  Eat a soft diet IF YOU HAVE NAUSEA, BLOATING, ABDOMINAL PAIN, OR VOMITING.    FINDING OUT THE RESULTS OF YOUR TEST Not all test results are available during your visit. DR. Oneida Alar WILL CALL YOU WITHIN 14 DAYS OF YOUR PROCEDUE WITH YOUR RESULTS. Do not assume everything is normal if you have not heard from DR. FIELDS, CALL HER OFFICE AT [phone].  SEEK IMMEDIATE MEDICAL ATTENTION AND CALL THE OFFICE: [phone] IF:  You have more than a spotting of blood in your stool.   Your belly is swollen (abdominal distention).   You are nauseated or vomiting.   You have a temperature over 101F.   You have abdominal pain or discomfort that is severe or gets worse throughout the day.   Gastritis  Gastritis is an inflammation (the body's way of reacting to injury and/or infection) of the stomach. It is often caused by viral or bacterial (germ) infections. It can also be caused BY ASPIRIN, BC/GOODY POWDER'S, (IBUPROFEN) MOTRIN, OR ALEVE (NAPROXEN), chemicals (including alcohol), SPICY FOODS, and medications. This illness may be associated with generalized malaise (feeling tired, not well),  UPPER ABDOMINAL STOMACH cramps, and fever. One common bacterial cause of gastritis is an organism known as H. Pylori. This can be treated with antibiotics.

## 2019-08-30 ENCOUNTER — Other Ambulatory Visit: Payer: Self-pay

## 2019-08-30 ENCOUNTER — Telehealth: Payer: Self-pay | Admitting: Gastroenterology

## 2019-08-30 LAB — SURGICAL PATHOLOGY

## 2019-08-30 NOTE — Telephone Encounter (Signed)
OPENED IN ERROR

## 2019-08-30 NOTE — Telephone Encounter (Signed)
Pt had procedure yesterday by [facility] and has questions about the medications. [phone]

## 2019-08-30 NOTE — Telephone Encounter (Signed)
Pt called in and wanted to review info pertaining to CT scan.  All questions were answered.

## 2019-08-30 NOTE — Telephone Encounter (Signed)
Pt wants to know if we can prescribe something besides Protonix for her.  She said that she is still having a burning and pressure in her chest.  [facility].  Pt's phone number: [phone]

## 2019-09-01 ENCOUNTER — Other Ambulatory Visit: Payer: Self-pay

## 2019-09-01 ENCOUNTER — Ambulatory Visit (HOSPITAL_COMMUNITY)
Admission: RE | Admit: 2019-09-01 | Discharge: 2019-09-01 | Disposition: A | Discharge status: Home / Self Care | Payer: Self-pay | Source: Ambulatory Visit | Attending: Gastroenterology | Admitting: Gastroenterology

## 2019-09-01 DIAGNOSIS — R1033 Periumbilical pain: Secondary | ICD-10-CM | POA: Insufficient documentation

## 2019-09-01 LAB — POCT I-STAT CREATININE: Creatinine, Ser: 0.7 mg/dL (ref 0.44–1.00)

## 2019-09-01 MED ORDER — IOHEXOL 300 MG/ML  SOLN
75.0000 mL | Freq: Once | INTRAMUSCULAR | Status: AC | PRN
  Administered 2019-09-01: 75 mL via INTRAVENOUS

## 2019-09-01 MED ORDER — FAMOTIDINE 20 MG PO TABS: 20.0000 mg | ORAL_TABLET | Freq: Two times a day (BID) | ORAL | 11 refills | Status: DC

## 2019-09-01 NOTE — Telephone Encounter (Signed)
Spoke with pt and informed her to take Pepcid twice a day Mon-Fri to help control the burning sensation.  She was also advised to take viscous lidocaine with a syringe using 2 tsp q 4-6 prn for chest, abd pain, and heartburn.  She was advised not to use more than 8 doses a day.  She was encouraged to complete the CT scan she has scheduled for today.  Pt voiced understanding.

## 2019-09-01 NOTE — Addendum Note (Signed)
Addended by: [place] on: 09/01/2019 01:41 PM   Modules accepted: Orders

## 2019-09-01 NOTE — Telephone Encounter (Signed)
PLEASE CALL PT. SHE SHOULD ADD PEPCID BID MON-FRI TO HELP CONTROL BURNING SENSATION. SHE SHOULD TAKE VISCOUS LIDOCAINE WITH A SYRINGE. USE 2 TSP Q4-6H PRN FOR CHEST, OR UPPER ABDOMINAL PAIN OR HEARTBURN. USE NO MORE THAN 8 DOSES A DAY. IT WILL MAKE ESOPHAGUS, AND STOMACH NUMB.  COMPLETE CT SCAN.

## 2019-09-02 NOTE — Telephone Encounter (Signed)
FYI Pt called with c/o of chest and abdominal pain. Pt is taking Pepcid bid and is taking vicious lidocaine as directed. Pt had CT done yesterday and states her pain is at a 10. Pt was advised to go to the ED if her pain level was at a 10. Pt states she's called EMS a few times previously and an EKG was preformed. Pt states EMS has told her before that she isn't having a heart attack. Pt was told if her chest pain was severe, she needs to be evaluated at the ED. Pt also states she can't live like this with this pain.

## 2019-09-03 ENCOUNTER — Telehealth: Payer: Self-pay | Admitting: Internal Medicine

## 2019-09-03 NOTE — Telephone Encounter (Signed)
Patient called me today.  Having extreme upper abdominal pain radiating to up under her breast.  Drinking lots of Maalox and taking famotidine.  No real relief.  States it is 10 out of 10.  Recent EGD and CT reviewed. Esophagus normal.  Non-H. pylori gastritis on biopsies. 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  Been to the emergency department.  Cardiopulmonary etiology much less likely with her evaluation. Sounded as though that she was in distress.  I recommended she go back to the emergency room.  If she does not do that, I have recommended we arrange for her to be seen by a provider in our office the first of the week. She also needs a pancreatic protocol MRI as recommended by the radiologist to further evaluate her pancreas lesion. Stacy, please see what you can do about getting her into be seen by someone Monday, 3/8.  Thanks. Please also schedule a pancreatic protocol MRI to further evaluate her pancreas.

## 2019-09-04 ENCOUNTER — Telehealth: Payer: Self-pay | Admitting: Internal Medicine

## 2019-09-04 NOTE — Telephone Encounter (Signed)
Patient call me back today.  States she has not been able to eat or drink anything for 4 days now.  Relating chronic chest pain which she states seems to be worse.  Does not feel like she can wait until tomorrow.  No fever.  As previously discussed and recommended yesterday, If she feels that she is getting worse and cannot wait for her office visit she should come to the emergency room for further evaluation without delay. This was reiterated to the patient during our telephone conversation today.  I believe this is in patient's best interest to come to the hospital.

## 2019-09-05 ENCOUNTER — Telehealth: Payer: Self-pay | Admitting: Gastroenterology

## 2019-09-05 NOTE — Telephone Encounter (Signed)
Pt called this morning to make OV. She spoke to RMR over the weekend and was advised to go to the ER. Pt did not go. She wanted to be seen today, but I told her there was nothing available today and she agreed to come in the morning at 8am. She said she was having abd pain and can not eat. [phone]

## 2019-09-05 NOTE — Progress Notes (Addendum)
REVIEWED. AGREE. NO ADDITIONAL RECOMMENDATIONS.  Referring Provider: Adaline Sill, NP Primary Care Physician:  Adaline Sill, NP Primary GI: Dr. Oneida Alar   Chief Complaint  Patient presents with  . Abdominal Pain    x 6 months  . Weight Loss    HPI:   Marie Diaz is a 54 y.o. female presenting today with a history of dyspepsia, weight loss, s/p  EGD August 29, 2019: normal esophagus, localized moderate inflammation with adherent blood, congestion, erosions, and friability on greater curvature of stomach. Biopsy with chronic gastritis, negative H.pylori. CT abd/pelvis with contrast then completed: no acute findings. Indeterminate low-density structure within tail of pancreas measuring 1.3cm.   Review of weights ranging from 100 in 2017 to high of 113 in 2019. This past year she has been in the low 1teens, dropping down to 108 in Feb 2021. Now 103.   Only peace she has is when asleep. Feels a fire going up under her ribs and feels like pressure. Constant. Ate a piece of toast since last Thursday. Gets dry heaves at times. No vomiting. Pain is not worsened with eating. Feels weak where she hasn't eaten. Pain present for about 6 months. Gallbladder absent. Thought she was having a heart attack at one point and had to call EMS. States Protonix didn't help and she stopped this after 2 months. No NSAIDs. Lidocaine not helping.   No ETOH use. No drug use. Has had looser stool the last 4 days. BM usually every other day.   CT abd/pelvis with contrast reviewed with radiologist: rare atherosclerotic disease, no significant findings with widely patent vessels.   She notes at times pain will radiate up into chest with associated SOB.   Past Medical History:  Diagnosis Date  . Depression   . Hypertension     Past Surgical History:  Procedure Laterality Date  . ABDOMINAL HYSTERECTOMY    . BIOPSY  08/29/2019   Procedure: BIOPSY;  Surgeon: Danie Binder, MD;  Location: [facility];  Service: Endoscopy;;  . CHOLECYSTECTOMY    . ESOPHAGOGASTRODUODENOSCOPY N/A 08/29/2019   normal esophagus, localized moderate inflammation with adherent blood, congestion, erosions, and friability on greater curvature of stomach. Biopsy with chronic gastritis, negative H.pylori.   Marland Kitchen HEMORRHOID SURGERY      Current Outpatient Medications  Medication Sig Dispense Refill  . ALPRAZolam (XANAX) 1 MG tablet Take 1 mg by mouth at bedtime.     Marland Kitchen amLODipine (NORVASC) 10 MG tablet Take 10 mg by mouth daily.     . cloNIDine (CATAPRES) 0.2 MG tablet Take 0.2 mg by mouth 2 (two) times daily.     Marland Kitchen escitalopram (LEXAPRO) 20 MG tablet Take 20 mg by mouth daily.    . famotidine (PEPCID) 20 MG tablet Take 1 tablet (20 mg total) by mouth 2 (two) times daily. 60 tablet 11  . lidocaine (XYLOCAINE) 2 % solution Using a syringe, 10 mL PO q4h PRN FOR ABDOMINAL/CHEST PAIN. NO MORE> 8 DOSES/DAY. (Patient taking differently: Use as directed 10 mLs in the mouth or throat every 4 (four) hours as needed (ABDOMINAL/CHEST PAIN. NO MORE> 8 DOSES/DAY.). ) 300 mL 5  . lisinopril (ZESTRIL) 20 MG tablet Take 20 mg by mouth daily.     . sucralfate (CARAFATE) 1 GM/10ML suspension Take 10 mLs (1 g total) by mouth 4 (four) times daily. 420 mL 1   No current facility-administered medications for this visit.    Allergies as of 09/06/2019 -  Review Complete 09/06/2019  Allergen Reaction Noted  . Tetracyclines & related Other (See Comments) 11/07/2015  . Ultram [tramadol] Other (See Comments) 08/01/2012    Family History  Problem Relation Age of Onset  . Colon cancer Neg Hx   . Colon polyps Neg Hx     Social History   Socioeconomic History  . Marital status: Married    Spouse name: Not on file  . Number of children: Not on file  . Years of education: Not on file  . Highest education level: Not on file  Occupational History  . Not on file  Tobacco Use  . Smoking status: Current Every Day Smoker    Packs/day:  0.50    Types: Cigarettes  . Smokeless tobacco: Never Used  Substance and Sexual Activity  . Alcohol use: No  . Drug use: No  . Sexual activity: Not on file  Other Topics Concern  . Not on file  Social History Narrative   [phone]), kids(2). 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 SHE'S AN ONLY CHILD.   Social Determinants of Health   Financial Resource Strain:   . Difficulty of Paying Living Expenses: Not on file  Food Insecurity:   . Worried About Programme researcher, broadcasting/film/video in the Last Year: Not on file  . Ran Out of Food in the Last Year: Not on file  Transportation Needs:   . Lack of Transportation (Medical): Not on file  . Lack of Transportation (Non-Medical): Not on file  Physical Activity:   . Days of Exercise per Week: Not on file  . Minutes of Exercise per Session: Not on file  Stress:   . Feeling of Stress : Not on file  Social Connections:   . Frequency of Communication with Friends and Family: Not on file  . Frequency of Social Gatherings with Friends and Family: Not on file  . Attends Religious Services: Not on file  . Active Member of Clubs or Organizations: Not on file  . Attends Banker Meetings: Not on file  . Marital Status: Not on file    Review of Systems: Gen: see HPI CV: see HPI Resp: Denies dyspnea at rest, cough, wheezing, coughing up blood, and pleurisy. GI: see HPI Derm: Denies rash, itching, dry skin Psych: Denies depression, anxiety, memory loss, confusion. No homicidal or suicidal ideation.  Heme: Denies bruising, bleeding, and enlarged lymph nodes.  Physical Exam: BP (!) 141/86   Pulse 70   Temp (!) 97.5 F (36.4 C) (Temporal)   Ht 5\' 2"  (1.575 m)   Wt 103 lb 6.4 oz (46.9 kg)   BMI 18.91 kg/m  General:   Alert and oriented. No distress noted. Pleasant and cooperative.  Head:  Normocephalic and atraumatic. Eyes:  Conjuctiva clear without scleral icterus. Abdomen:  +BS, soft,  TTP epigastric and non-distended. No rebound or guarding. No HSM or masses noted. Msk:  Symmetrical without gross deformities. Normal posture. Extremities:  Without edema. Neurologic:  Alert and  oriented x4 Psych:  Alert and cooperative. Normal mood and affect.  ASSESSMENT: Marie Diaz is a 54 y.o. female presenting today with 6 month history of epigastric pain at times radiating into chest with associated shortness of breath, which is remaining constant and unchanged by eating/drinking. However, she has lost weight due to loss of appetite in setting of continued pain. EGD on file localized moderate inflammation with adherent blood, congestion, erosions, and friability on  greater curvature of stomach. Biopsy with chronic gastritis, negative H.pylori. She has not been taking Protonix, as she states this was not helpful for past 2 months. Currently without relief taking viscous lidocaine. Pepcid BID currently.  I reviewed CT with radiologist: mesenteric vasculature is widely patent. Her symptoms do not point to ischemia. Pancreatic lesion noted on CT, but her symptom report is out of proportion to work-up thus far. Will pursue ASAP MRI pancreas for further evaluation.  I also recommend Cardiology referral due to reports of pain radiating up into chest. Less likely cardiac etiology, but in light of her persistent symptoms will place this referral for ASAP. She was advised to present to ED if recurrent chest pain.    PLAN:   CBC, CMP, lipase today  Needs PPI: start Dexilant samples now, continue Pepcid  Carafate suspension QID  MRI pancreas this week  Referral to Cardiology  Present to ED if recurrent chest pain  Further recommendations to follow   Gelene Mink, PhD, ANP-BC [facility]

## 2019-09-05 NOTE — Telephone Encounter (Signed)
Spoke with pt. Pt is aware that per RMR's recommendations that she should go to the ED if her symptoms worsen and chest pain continues. Pt was scheduled for an apt tomorrow by Darl Pikes.

## 2019-09-06 ENCOUNTER — Other Ambulatory Visit (HOSPITAL_COMMUNITY)
Admission: RE | Admit: 2019-09-06 | Discharge: 2019-09-06 | Disposition: A | Discharge status: Home / Self Care | Payer: Self-pay | Source: Ambulatory Visit | Attending: Gastroenterology | Admitting: Gastroenterology

## 2019-09-06 ENCOUNTER — Encounter: Payer: Self-pay | Admitting: Gastroenterology

## 2019-09-06 ENCOUNTER — Encounter: Payer: Self-pay | Admitting: *Deleted

## 2019-09-06 ENCOUNTER — Telehealth: Payer: Self-pay | Admitting: Gastroenterology

## 2019-09-06 ENCOUNTER — Ambulatory Visit (HOSPITAL_COMMUNITY)
Admission: RE | Admit: 2019-09-06 | Discharge: 2019-09-06 | Disposition: A | Discharge status: Home / Self Care | Payer: Self-pay | Source: Ambulatory Visit | Attending: Gastroenterology | Admitting: Gastroenterology

## 2019-09-06 ENCOUNTER — Other Ambulatory Visit: Payer: Self-pay

## 2019-09-06 ENCOUNTER — Other Ambulatory Visit: Payer: Self-pay | Admitting: Gastroenterology

## 2019-09-06 ENCOUNTER — Ambulatory Visit: Payer: Self-pay | Admitting: Gastroenterology

## 2019-09-06 VITALS — BP 141/86 | HR 70 | Temp 97.5°F | Ht 62.0 in | Wt 103.4 lb

## 2019-09-06 DIAGNOSIS — K869 Disease of pancreas, unspecified: Secondary | ICD-10-CM

## 2019-09-06 DIAGNOSIS — R1013 Epigastric pain: Secondary | ICD-10-CM

## 2019-09-06 DIAGNOSIS — R079 Chest pain, unspecified: Secondary | ICD-10-CM

## 2019-09-06 LAB — CBC WITH DIFFERENTIAL/PLATELET
Abs Immature Granulocytes: 0.03 10*3/uL (ref 0.00–0.07)
Basophils Absolute: 0.1 10*3/uL (ref 0.0–0.1)
Basophils Relative: 1 %
Eosinophils Absolute: 0.1 10*3/uL (ref 0.0–0.5)
Eosinophils Relative: 2 %
HCT: 44.8 % (ref 36.0–46.0)
Hemoglobin: 14.9 g/dL (ref 12.0–15.0)
Immature Granulocytes: 0 %
Lymphocytes Relative: 32 %
Lymphs Abs: 2.3 10*3/uL (ref 0.7–4.0)
MCH: 31 pg (ref 26.0–34.0)
MCHC: 33.3 g/dL (ref 30.0–36.0)
MCV: 93.1 fL (ref 80.0–100.0)
Monocytes Absolute: 0.6 10*3/uL (ref 0.1–1.0)
Monocytes Relative: 8 %
Neutro Abs: 4 10*3/uL (ref 1.7–7.7)
Neutrophils Relative %: 57 %
Platelets: 275 10*3/uL (ref 150–400)
RBC: 4.81 MIL/uL (ref 3.87–5.11)
RDW: 11.7 % (ref 11.5–15.5)
WBC: 7.1 10*3/uL (ref 4.0–10.5)
nRBC: 0 % (ref 0.0–0.2)

## 2019-09-06 LAB — COMPREHENSIVE METABOLIC PANEL
ALT: 41 U/L (ref 0–44)
AST: 25 U/L (ref 15–41)
Albumin: 4.4 g/dL (ref 3.5–5.0)
Alkaline Phosphatase: 82 U/L (ref 38–126)
Anion gap: 9 (ref 5–15)
BUN: 8 mg/dL (ref 6–20)
CO2: 28 mmol/L (ref 22–32)
Calcium: 9.2 mg/dL (ref 8.9–10.3)
Chloride: 105 mmol/L (ref 98–111)
Creatinine, Ser: 0.78 mg/dL (ref 0.44–1.00)
GFR calc Af Amer: >60 mL/min (ref >60)
GFR calc non Af Amer: >60 mL/min (ref >60)
Glucose, Bld: 98 mg/dL (ref 70–99)
Potassium: 3.9 mmol/L (ref 3.5–5.1)
Sodium: 142 mmol/L (ref 135–145)
Total Bilirubin: 0.5 mg/dL (ref 0.3–1.2)
Total Protein: 7 g/dL (ref 6.5–8.1)

## 2019-09-06 LAB — LIPASE, BLOOD: Lipase: 22 U/L (ref 11–51)

## 2019-09-06 MED ORDER — GADOBUTROL 1 MMOL/ML IV SOLN
4.0000 mL | Freq: Once | INTRAVENOUS | Status: AC | PRN
  Administered 2019-09-06: 10:00:00 4 mL via INTRAVENOUS

## 2019-09-06 MED ORDER — SUCRALFATE 1 GM/10ML PO SUSP: 1.0000 g | Freq: Four times a day (QID) | ORAL | 1 refills | Status: DC

## 2019-09-06 NOTE — Telephone Encounter (Signed)
PT NEEDS TO GO TO ED FOR 10/10 CHEST PAIN.

## 2019-09-06 NOTE — Telephone Encounter (Signed)
Pt returned call and is aware if her pain worsens, she needs to go to the ED per [facility].

## 2019-09-06 NOTE — Telephone Encounter (Signed)
Lmom, waiting on a return call. Pt had an appointment with AB today and was given instructions. Will discuss with pt when she calls back.

## 2019-09-06 NOTE — Addendum Note (Signed)
Addended by: Armstead Peaks on: 09/06/2019 09:00 AM   Modules accepted: Orders

## 2019-09-06 NOTE — Telephone Encounter (Signed)
REVIEWED. AGREE. NO ADDITIONAL RECOMMENDATIONS. 

## 2019-09-06 NOTE — Telephone Encounter (Signed)
Referral sent 

## 2019-09-06 NOTE — Telephone Encounter (Signed)
PLEASE CALL PT. IF SHE IS HAVING 10/10 CHEST PAIN SHE SHOULD GO TO THE ED. NSHE NEEDS A CT SCAN OF HER CHEST.  NO SOURCE FOR HER ABDOMINAL OR CHEST PAIN WAS IDENTIFIED ON THE CT OF THE ABDOMEN. WE WILL CALL HER WHEN THE MRI RESULTS ARE AVAILABLE.

## 2019-09-06 NOTE — Patient Instructions (Signed)
Please have blood work done today.   I have ordered an MRI to further assess your pancreas.  Start taking Dexilant 1 capsule daily. I have also sent in carafate liquid to take 4 times per day.   Try to stay hydrated and drink plenty of water to keep urine clear. If you are unable to tolerate liquids or feel weaker, go to the ED.  Further recommendations to follow!!!  It was a pleasure to see you today. I want to create trusting relationships with patients to provide genuine, compassionate, and quality care. I value your feedback. If you receive a survey regarding your visit,  I greatly appreciate you taking time to fill this out.   Gelene Mink, PhD, ANP-BC [facility]

## 2019-09-06 NOTE — Telephone Encounter (Signed)
Please refer to Cardiology. She has 6 month history of epigastric pain but radiating into chest at time with associated SOB. She is being further evaluated for chronic abdominal pain but needs this to rule out any underlying occult etiology.

## 2019-09-06 NOTE — Telephone Encounter (Signed)
I just received a call from the pharmacy asking to speak with AB to change medication from a liquid to pill form.  MRI is scheduled for today.

## 2019-09-26 ENCOUNTER — Ambulatory Visit: Payer: Self-pay | Admitting: Cardiovascular Disease

## 2019-12-14 NOTE — Progress Notes (Deleted)
Referring Provider: Rebekah Chesterfield, NP Primary Care Physician:  Laqueta Linden, MD Primary GI Physician: Dr. Darrick Penna (Dr. Jena Gauss following for now)  No chief complaint on file.   HPI:   Marie Diaz is a 54 y.o. female with a history of dyspepsia, weight loss, s/p  EGD August 29, 2019: normal esophagus, localized moderate inflammation with adherent blood, congestion, erosions, and friability on greater curvature of stomach. Biopsy with chronic gastritis, negative H.pylori. CT abd/pelvis with contrast then completed: no acute findings. Indeterminate low-density structure within tail of pancreas measuring 1.3cm.   She presents today for follow-up of dyspepsia.  Last seen in the office 09/06/2019 for the same.  At that point, epigastric pain had been present for about 6 months.  Radiating into the chest with associated shortness of breath.  Symptoms remain constant and unchanged by eating/drinking.  Weight loss due to loss of appetite in the setting of continued pain.  CT was reviewed with radiologist: Mesenteric vasculature is widely patent.  Symptoms did not point to ischemia.  Pancreatic lesion noted on CT but her symptom report is out of proportion to work-up thus far.  Plan to pursue ASAP MRI pancreas for further evaluation.  Also plan to update CBC, CMP, and lipase, start Dexilant and Carafate 4 times daily.  Also recommend cardiology referral due to reports of pain radiating into the chest.  Labs completed 09/06/2019: CBC within normal limits, CMP within normal limits, lipase normal. 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  Recommend MRI in 1 year. Progress report on 09/12/2019 with patient reporting burning heavy feeling in her chest last night.  Patient feels like burning sensation goes to her ribs.  Not much different than before.  She did not want to go  to the ER because she feels like it is not a heart attack.  She was recommended to go to the ED as females can present atypically with cardiac issues.  Patient no showed to appointment with cardiology on 09/26/19.  Patient was admitted 11/17/2019-11/19/2019 at [facility] with hypotension, dehydration, syncope, rhinorrhea, and cough.  Patient declined CT scan of the head. Chest x-ray with hyperinflation and mild chronic bronchitic changes, no infiltrates or effusions.  CBC was unremarkable, lactate was normal, potassium was 3.3 on admission which was repleted and 3.5 at time of discharge and blood cultures were negative to date. Treated with IV fluids and she had improvement in her blood pressure. Noted to have audible expiratory wheezes on exam and was given Xopenex treatments with improvement in her symptoms. Respiratory panel was positive for rhinovirus. Echocardiogram not completed prior to discharge with plans to complete this outpatient. She was prescribed albuterol as needed and guaifenesin TID as needed for cough.   Today:     Past Medical History:  Diagnosis Date  . Depression   . Hypertension     Past Surgical History:  Procedure Laterality Date  . ABDOMINAL HYSTERECTOMY    . BIOPSY  08/29/2019   Procedure: BIOPSY;  Surgeon: West Bali, MD;  Location: [facility];  Service: Endoscopy;;  . CHOLECYSTECTOMY    . ESOPHAGOGASTRODUODENOSCOPY N/A 08/29/2019   normal esophagus, localized moderate inflammation with adherent blood, congestion, erosions, and friability on greater curvature of stomach. Biopsy with chronic gastritis, negative H.pylori.   Marland Kitchen HEMORRHOID SURGERY      Current Outpatient Medications  Medication Sig Dispense Refill  . ALPRAZolam (XANAX) 1 MG tablet Take 1 mg by mouth at bedtime.     Marland Kitchen amLODipine (NORVASC) 10 MG tablet Take 10 mg by mouth daily.     . cloNIDine (CATAPRES) 0.2 MG tablet Take 0.2 mg by mouth 2 (two) times daily.     Marland Kitchen escitalopram (LEXAPRO) 20  MG tablet Take 20 mg by mouth daily.    . famotidine (PEPCID) 20 MG tablet Take 1 tablet (20 mg total) by mouth 2 (two) times daily. 60 tablet 11  . lidocaine (XYLOCAINE) 2 % solution Using a syringe, 10 mL PO q4h PRN FOR ABDOMINAL/CHEST PAIN. NO MORE> 8 DOSES/DAY. (Patient taking differently: Use as directed 10 mLs in the mouth or throat every 4 (four) hours as needed (ABDOMINAL/CHEST PAIN. NO MORE> 8 DOSES/DAY.). ) 300 mL 5  . lisinopril (ZESTRIL) 20 MG tablet Take 20 mg by mouth daily.     . sucralfate (CARAFATE) 1 GM/10ML suspension Take 10 mLs (1 g total) by mouth 4 (four) times daily. 420 mL 1   No current facility-administered medications for this visit.    Allergies as of 12/15/2019 - Review Complete 09/06/2019  Allergen Reaction Noted  . Tetracyclines & related Other (See Comments) 11/07/2015  . Ultram [tramadol] Other (See Comments) 08/01/2012    Family History  Problem Relation Age of Onset  . Colon cancer Neg Hx   . Colon polyps Neg Hx     Social History   Socioeconomic History  . Marital status: Married    Spouse name: Not on file  . Number of children: Not on file  . Years of education: Not on file  . Highest education level: Not on file  Occupational History  . Not on file  Tobacco Use  . Smoking status: Current Every Day Smoker    Packs/day: 0.50    Types: Cigarettes  . Smokeless tobacco: Never Used  Vaping Use  . Vaping Use: Former  Substance and Sexual Activity  . Alcohol use: No  . Drug use: No  . Sexual activity: Not on file  Other Topics Concern  . Not on file  Social History Narrative   [phone]), kids(2). 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 SHE'S AN ONLY CHILD.   Social Determinants of Health   Financial Resource Strain:   . Difficulty of Paying Living Expenses:   Food Insecurity:   . Worried About Charity fundraiser in the Last Year:   . Arboriculturist in the Last Year:     Transportation Needs:   . Film/video editor (Medical):   Marland Kitchen Lack of Transportation (Non-Medical):   Physical Activity:   . Days of Exercise per Week:   . Minutes of Exercise per Session:   Stress:   . Feeling of Stress :   Social Connections:   . Frequency of Communication with Friends and Family:   . Frequency of Social Gatherings with Friends and Family:   . Attends Religious Services:   . Active Member of Clubs or Organizations:   . Attends Archivist Meetings:   Marland Kitchen Marital Status:     Review of Systems: Gen: Denies fever, chills, anorexia. Denies fatigue, weakness, weight loss.  CV: Denies chest pain, palpitations, syncope, peripheral edema, and claudication. Resp: Denies dyspnea at rest, cough, wheezing, coughing up blood, and pleurisy. GI: Denies vomiting blood, jaundice, and fecal incontinence.   Denies dysphagia  or odynophagia. Derm: Denies rash, itching, dry skin Psych: Denies depression, anxiety, memory loss, confusion. No homicidal or suicidal ideation.  Heme: Denies bruising, bleeding, and enlarged lymph nodes.  Physical Exam: There were no vitals taken for this visit. General:   Alert and oriented. No distress noted. Pleasant and cooperative.  Head:  Normocephalic and atraumatic. Eyes:  Conjuctiva clear without scleral icterus. Mouth:  Oral mucosa pink and moist. Good dentition. No lesions. Heart:  S1, S2 present without murmurs appreciated. Lungs:  Clear to auscultation bilaterally. No wheezes, rales, or rhonchi. No distress.  Abdomen:  +BS, soft, non-tender and non-distended. No rebound or guarding. No HSM or masses noted. Msk:  Symmetrical without gross deformities. Normal posture. Extremities:  Without edema. Neurologic:  Alert and  oriented x4 Psych:  Alert and cooperative. Normal mood and affect.

## 2019-12-15 ENCOUNTER — Encounter: Payer: Self-pay | Admitting: Internal Medicine

## 2019-12-15 ENCOUNTER — Ambulatory Visit: Payer: Self-pay | Admitting: Gastroenterology

## 2020-08-20 ENCOUNTER — Telehealth: Payer: Self-pay | Admitting: Internal Medicine

## 2020-08-20 NOTE — Telephone Encounter (Signed)
Recall for mri °

## 2020-08-20 NOTE — Telephone Encounter (Signed)
Recall mailed 

## 2021-01-09 ENCOUNTER — Encounter (HOSPITAL_COMMUNITY): Payer: Self-pay | Admitting: *Deleted

## 2021-01-09 ENCOUNTER — Emergency Department (HOSPITAL_COMMUNITY)
Admission: EM | Admit: 2021-01-09 | Discharge: 2021-01-09 | Disposition: A | Discharge status: Home / Self Care | Payer: Self-pay | Attending: Emergency Medicine | Admitting: Emergency Medicine

## 2021-01-09 ENCOUNTER — Emergency Department (HOSPITAL_COMMUNITY): Payer: Self-pay

## 2021-01-09 ENCOUNTER — Other Ambulatory Visit: Payer: Self-pay

## 2021-01-09 DIAGNOSIS — F17210 Nicotine dependence, cigarettes, uncomplicated: Secondary | ICD-10-CM | POA: Insufficient documentation

## 2021-01-09 DIAGNOSIS — F419 Anxiety disorder, unspecified: Secondary | ICD-10-CM

## 2021-01-09 DIAGNOSIS — F410 Panic disorder [episodic paroxysmal anxiety] without agoraphobia: Secondary | ICD-10-CM | POA: Insufficient documentation

## 2021-01-09 DIAGNOSIS — F32A Depression, unspecified: Secondary | ICD-10-CM

## 2021-01-09 DIAGNOSIS — Z79899 Other long term (current) drug therapy: Secondary | ICD-10-CM | POA: Insufficient documentation

## 2021-01-09 DIAGNOSIS — I10 Essential (primary) hypertension: Secondary | ICD-10-CM | POA: Insufficient documentation

## 2021-01-09 LAB — TROPONIN I (HIGH SENSITIVITY): Troponin I (High Sensitivity): <2 ng/L (ref <18)

## 2021-01-09 LAB — CBC
HCT: 41.3 % (ref 36.0–46.0)
Hemoglobin: 14.5 g/dL (ref 12.0–15.0)
MCH: 31.5 pg (ref 26.0–34.0)
MCHC: 35.1 g/dL (ref 30.0–36.0)
MCV: 89.8 fL (ref 80.0–100.0)
Platelets: 271 10*3/uL (ref 150–400)
RBC: 4.6 MIL/uL (ref 3.87–5.11)
RDW: 12.4 % (ref 11.5–15.5)
WBC: 11.3 10*3/uL — ABNORMAL HIGH (ref 4.0–10.5)
nRBC: 0 % (ref 0.0–0.2)

## 2021-01-09 LAB — BASIC METABOLIC PANEL
Anion gap: 7 (ref 5–15)
BUN: 12 mg/dL (ref 6–20)
CO2: 24 mmol/L (ref 22–32)
Calcium: 8.9 mg/dL (ref 8.9–10.3)
Chloride: 108 mmol/L (ref 98–111)
Creatinine, Ser: 0.61 mg/dL (ref 0.44–1.00)
GFR, Estimated: >60 mL/min (ref >60)
Glucose, Bld: 102 mg/dL — ABNORMAL HIGH (ref 70–99)
Potassium: 3.7 mmol/L (ref 3.5–5.1)
Sodium: 139 mmol/L (ref 135–145)

## 2021-01-09 LAB — D-DIMER, QUANTITATIVE: D-Dimer, Quant: 0.34 ug/mL-FEU (ref 0.00–0.50)

## 2021-01-09 MED ORDER — HYDROXYZINE HCL 25 MG PO TABS: 25.0000 mg | ORAL_TABLET | Freq: Four times a day (QID) | ORAL | 0 refills | Status: DC

## 2021-01-09 MED ORDER — LORAZEPAM 1 MG PO TABS
1.0000 mg | ORAL_TABLET | Freq: Once | ORAL | Status: AC
  Administered 2021-01-09: 1 mg via ORAL
  Filled 2021-01-09: qty 1

## 2021-01-09 NOTE — ED Provider Notes (Signed)
[facility] EMERGENCY DEPARTMENT Provider Note   CSN: [id number] Arrival date & time: 01/09/21  1751     History Chief Complaint  Patient presents with   V70.1    Marie Diaz is a 55 y.o. female.  HPI  55 year old female with a history of depression, hypertension, who presents to the emergency department today for evaluation of anxiety.  She also reports depression.  She cites several factors that relate to her family including her daughter having history of substance use, her son also abuses alcohol, her husband has health problems and she feels very overwhelmed.  She states she has been having panic attacks that cause her to have chest pain.  She also has decreased appetite.  She only feels better when she is able to sleep.  She denies any thoughts of suicide or wanting to harm her self.  Denies any HI or AVH.  She does not use alcohol or drugs.  She does take medication for depression and has been compliant with it.  Past Medical History:  Diagnosis Date   Depression    Hypertension     Patient Active Problem List   Diagnosis Date Noted   Dyspepsia 08/17/2019   Numbness of right foot 08/23/2018   Right leg weakness 08/23/2018    Past Surgical History:  Procedure Laterality Date   ABDOMINAL HYSTERECTOMY     BIOPSY  08/29/2019   Procedure: BIOPSY;  Surgeon: West Bali, MD;  Location: [facility];  Service: Endoscopy;;   CHOLECYSTECTOMY     ESOPHAGOGASTRODUODENOSCOPY N/A 08/29/2019   normal esophagus, localized moderate inflammation with adherent blood, congestion, erosions, and friability on greater curvature of stomach. Biopsy with chronic gastritis, negative H.pylori.    HEMORRHOID SURGERY       OB History   No obstetric history on file.     Family History  Problem Relation Age of Onset   Colon cancer Neg Hx    Colon polyps Neg Hx     Social History   Tobacco Use   Smoking status: Every Day    Packs/day: 0.50    Pack years: 0.00    Types:  Cigarettes   Smokeless tobacco: Never  Vaping Use   Vaping Use: Former  Substance Use Topics   Alcohol use: No   Drug use: No    Home Medications Prior to Admission medications   Medication Sig Start Date End Date Taking? Authorizing Provider  ALPRAZolam Prudy Feeler) 1 MG tablet Take 1 mg by mouth at bedtime.    Yes [provider]  amLODipine (NORVASC) 10 MG tablet Take 10 mg by mouth daily.    Yes [provider]  cloNIDine (CATAPRES) 0.2 MG tablet Take 0.2 mg by mouth 2 (two) times daily.    Yes [provider]  escitalopram (LEXAPRO) 20 MG tablet Take 20 mg by mouth daily. 05/18/19  Yes [provider]  lisinopril (ZESTRIL) 20 MG tablet Take 20 mg by mouth daily.    Yes [provider]  famotidine (PEPCID) 20 MG tablet Take 1 tablet (20 mg total) by mouth 2 (two) times daily. Patient not taking: No sig reported 09/01/19   West Bali, MD  lidocaine (XYLOCAINE) 2 % solution Using a syringe, 10 mL PO q4h PRN FOR ABDOMINAL/CHEST PAIN. NO MORE> 8 DOSES/DAY. Patient not taking: Reported on 01/09/2021 08/17/19   West Bali, MD  sucralfate (CARAFATE) 1 GM/10ML suspension Take 10 mLs (1 g total) by mouth 4 (four) times daily. Patient  not taking: No sig reported 09/06/19   Gelene Mink, NP    Allergies    Tetracyclines & related and Ultram [tramadol]  Review of Systems   Review of Systems  Physical Exam Updated Vital Signs BP 109/62   Pulse 63   Temp 98.4 F (36.9 C) (Oral)   Resp 18   SpO2 97%   Physical Exam Vitals and nursing note reviewed.  Constitutional:      General: She is not in acute distress.    Appearance: She is well-developed.  HENT:     Head: Normocephalic and atraumatic.  Eyes:     Conjunctiva/sclera: Conjunctivae normal.  Cardiovascular:     Rate and Rhythm: Normal rate and regular rhythm.     Heart sounds: Normal heart sounds. No murmur heard. Pulmonary:     Effort: Pulmonary effort is normal. No respiratory  distress.     Breath sounds: Normal breath sounds. No wheezing, rhonchi or rales.  Abdominal:     General: Bowel sounds are normal.     Palpations: Abdomen is soft.     Tenderness: There is no abdominal tenderness. There is no guarding or rebound.  Musculoskeletal:     Cervical back: Neck supple.  Skin:    General: Skin is warm and dry.  Neurological:     Mental Status: She is alert.    ED Results / Procedures / Treatments   Labs (all labs ordered are listed, but only abnormal results are displayed) Labs Reviewed  BASIC METABOLIC PANEL - Abnormal; Notable for the following components:      Result Value   Glucose, Bld 102 (*)    All other components within normal limits  CBC - Abnormal; Notable for the following components:   WBC 11.3 (*)    All other components within normal limits  D-DIMER, QUANTITATIVE  TROPONIN I (HIGH SENSITIVITY)  TROPONIN I (HIGH SENSITIVITY)    EKG EKG Interpretation  Date/Time:  Wednesday January 09 2021 18:43:51 EDT Ventricular Rate:  65 PR Interval:  133 QRS Duration: 81 QT Interval:  432 QTC Calculation: 450 R Axis:   72 Text Interpretation: Sinus rhythm RSR' in V1 or V2, probably normal variant Confirmed by Alona Bene [id number]) on 01/09/2021 6:47:53 PM  Radiology DG Chest Port 1 View  Result Date: 01/09/2021 CLINICAL DATA:  Anxiety, hypertension EXAM: PORTABLE CHEST 1 VIEW COMPARISON:  Radiograph 06/08/2020, CT 10/08/2015 FINDINGS: No consolidation, features of edema, pneumothorax, or effusion. Pulmonary vascularity is normally distributed. The cardiomediastinal contours are unremarkable. No acute osseous or soft tissue abnormality. IMPRESSION: No acute cardiopulmonary abnormality. Electronically Signed   By: Kreg Shropshire M.D.   On: 01/09/2021 19:18    Procedures Procedures   Medications Ordered in ED Medications  LORazepam (ATIVAN) tablet 1 mg (1 mg Oral Given 01/09/21 1852)    ED Course  I have reviewed the triage vital signs and the  nursing notes.  Pertinent labs & imaging results that were available during my care of the patient were reviewed by me and considered in my medical decision making (see chart for details).    MDM Rules/Calculators/A&P                          55 year old female presenting the emergency department today for evaluation of anxiety and depression.  She has been having frequent panic attacks and is complaining of chest pain secondary to this.  She states he feels similar to her prior panic attacks.  Cardiac work-up was initiated due to her complaints of chest pain and she had a normal CBC, BMP, troponin, D-dimer.  EKG was unremarkable and chest x-ray was also unremarkable.  Patient continues to deny any suicidality, homicidality or AVH.  She not appear to be responding to internal stimuli.  She does not appear to meet requirement for emergent psychiatric evaluation at this time but I did refer her to be happy give her resources for outpatient follow-up.  I will give Rx for hydroxyzine for her symptoms.  She is given Ativan here in the ED and on reassessment she does feel some improvement.  Have advised on specific return precautions.  She voiced understanding of plan and reasons to return.  All Questions answered patient stable for discharge.  Final Clinical Impression(s) / ED Diagnoses Final diagnoses:  Anxiety  Depression, unspecified depression type    Rx / DC Orders ED Discharge Orders     None        Rayne Du 01/09/21 2032    Maia Plan, MD 01/14/21 2213

## 2021-01-09 NOTE — Discharge Instructions (Addendum)
Prescription given for hydroxyzine. Take medication as directed and do not operate machinery, drive a car, or work while taking this medication as it can make you drowsy.   You were given information to follow-up with behavioral health urgent care and/or [facility] for evaluation of your anxiety/depression.  Please follow-up with the resources that were provided.  If you have any new or worsening symptoms including thoughts of wanting to harm yourself, thoughts of wanting to harm others or any hallucinations he should return to the emergency department for reassessment immediately.

## 2021-01-09 NOTE — ED Triage Notes (Signed)
Anxiety, feels like her family is going to drive her crazy

## 2021-02-03 ENCOUNTER — Emergency Department (HOSPITAL_COMMUNITY): Payer: Self-pay

## 2021-02-03 ENCOUNTER — Other Ambulatory Visit: Payer: Self-pay

## 2021-02-03 ENCOUNTER — Emergency Department (HOSPITAL_COMMUNITY)
Admission: EM | Admit: 2021-02-03 | Discharge: 2021-02-04 | Disposition: A | Discharge status: Home / Self Care | Payer: Self-pay | Attending: Emergency Medicine | Admitting: Emergency Medicine

## 2021-02-03 ENCOUNTER — Encounter (HOSPITAL_COMMUNITY): Payer: Self-pay | Admitting: Emergency Medicine

## 2021-02-03 DIAGNOSIS — F17210 Nicotine dependence, cigarettes, uncomplicated: Secondary | ICD-10-CM | POA: Insufficient documentation

## 2021-02-03 DIAGNOSIS — I10 Essential (primary) hypertension: Secondary | ICD-10-CM | POA: Insufficient documentation

## 2021-02-03 DIAGNOSIS — R079 Chest pain, unspecified: Secondary | ICD-10-CM | POA: Insufficient documentation

## 2021-02-03 DIAGNOSIS — Z79899 Other long term (current) drug therapy: Secondary | ICD-10-CM | POA: Insufficient documentation

## 2021-02-03 DIAGNOSIS — K2900 Acute gastritis without bleeding: Secondary | ICD-10-CM | POA: Insufficient documentation

## 2021-02-03 LAB — BASIC METABOLIC PANEL
Anion gap: 5 (ref 5–15)
BUN: 10 mg/dL (ref 6–20)
CO2: 25 mmol/L (ref 22–32)
Calcium: 8.7 mg/dL — ABNORMAL LOW (ref 8.9–10.3)
Chloride: 110 mmol/L (ref 98–111)
Creatinine, Ser: 0.75 mg/dL (ref 0.44–1.00)
GFR, Estimated: >60 mL/min (ref >60)
Glucose, Bld: 113 mg/dL — ABNORMAL HIGH (ref 70–99)
Potassium: 3.4 mmol/L — ABNORMAL LOW (ref 3.5–5.1)
Sodium: 140 mmol/L (ref 135–145)

## 2021-02-03 LAB — CBC
HCT: 42.7 % (ref 36.0–46.0)
Hemoglobin: 14.5 g/dL (ref 12.0–15.0)
MCH: 31 pg (ref 26.0–34.0)
MCHC: 34 g/dL (ref 30.0–36.0)
MCV: 91.4 fL (ref 80.0–100.0)
Platelets: 265 10*3/uL (ref 150–400)
RBC: 4.67 MIL/uL (ref 3.87–5.11)
RDW: 12.3 % (ref 11.5–15.5)
WBC: 9.1 10*3/uL (ref 4.0–10.5)
nRBC: 0 % (ref 0.0–0.2)

## 2021-02-03 LAB — HEPATIC FUNCTION PANEL
ALT: 23 U/L (ref 0–44)
AST: 17 U/L (ref 15–41)
Albumin: 4.4 g/dL (ref 3.5–5.0)
Alkaline Phosphatase: 74 U/L (ref 38–126)
Bilirubin, Direct: <0.1 mg/dL (ref 0.0–0.2)
Total Bilirubin: 0.4 mg/dL (ref 0.3–1.2)
Total Protein: 7.5 g/dL (ref 6.5–8.1)

## 2021-02-03 LAB — LIPASE, BLOOD: Lipase: 38 U/L (ref 11–51)

## 2021-02-03 LAB — TROPONIN I (HIGH SENSITIVITY): Troponin I (High Sensitivity): <2 ng/L (ref <18)

## 2021-02-03 MED ORDER — ONDANSETRON HCL 4 MG/2ML IJ SOLN
4.0000 mg | Freq: Once | INTRAMUSCULAR | Status: AC
  Administered 2021-02-03: 4 mg via INTRAVENOUS
  Filled 2021-02-03: qty 2

## 2021-02-03 MED ORDER — PANTOPRAZOLE SODIUM 40 MG IV SOLR
40.0000 mg | Freq: Once | INTRAVENOUS | Status: AC
  Administered 2021-02-03: 40 mg via INTRAVENOUS
  Filled 2021-02-03: qty 40

## 2021-02-03 NOTE — ED Provider Notes (Signed)
MSE note.  Patient with chest pain and epigastric pain for 2 days.  Physical exam patient tender in epigastric area.  Labs and x-rays ordered   Bethann Berkshire, MD 02/10/21 1013

## 2021-02-03 NOTE — ED Triage Notes (Signed)
Pt c/o chest pain x 2 days. Pain described is upper abd burning and pressure.

## 2021-02-04 LAB — TROPONIN I (HIGH SENSITIVITY): Troponin I (High Sensitivity): <2 ng/L (ref <18)

## 2021-02-04 LAB — POC URINE PREG, ED: Preg Test, Ur: NEGATIVE

## 2021-02-04 MED ORDER — LIDOCAINE VISCOUS HCL 2 % MT SOLN
15.0000 mL | Freq: Once | OROMUCOSAL | Status: AC
  Administered 2021-02-04: 15 mL via ORAL
  Filled 2021-02-04: qty 15

## 2021-02-04 MED ORDER — PANTOPRAZOLE SODIUM 40 MG PO TBEC: 40.0000 mg | DELAYED_RELEASE_TABLET | Freq: Every day | ORAL | 3 refills | Status: DC

## 2021-02-04 MED ORDER — SUCRALFATE 1 GM/10ML PO SUSP: 1.0000 g | Freq: Three times a day (TID) | ORAL | 0 refills | Status: DC

## 2021-02-04 MED ORDER — ALUM & MAG HYDROXIDE-SIMETH 200-200-20 MG/5ML PO SUSP
30.0000 mL | Freq: Once | ORAL | Status: AC
  Administered 2021-02-04: 30 mL via ORAL
  Filled 2021-02-04: qty 30

## 2021-02-04 MED ORDER — OXYCODONE-ACETAMINOPHEN 5-325 MG PO TABS
2.0000 | ORAL_TABLET | Freq: Once | ORAL | Status: AC
  Administered 2021-02-04: 2 via ORAL
  Filled 2021-02-04: qty 2

## 2021-02-04 MED ORDER — MORPHINE SULFATE (PF) 4 MG/ML IV SOLN
4.0000 mg | Freq: Once | INTRAVENOUS | Status: AC
  Administered 2021-02-04: 4 mg via INTRAVENOUS
  Filled 2021-02-04: qty 1

## 2021-02-04 NOTE — ED Notes (Signed)
Pt verbalized no improvement of pain after medications: GI cocktail and morphine.

## 2021-02-04 NOTE — ED Notes (Signed)
Discharge instructions dicussed with pt. Pt verbalized understanding with no questions at this time. Pt to go home with mother.  ?

## 2021-02-04 NOTE — ED Provider Notes (Signed)
[facility] EMERGENCY DEPARTMENT Provider Note   CSN: [id number] Arrival date & time: 02/03/21  2121     History No chief complaint on file.   Marie Diaz is a 55 y.o. female.  Patient presents to the emergency department for evaluation of upper abdominal and lower chest pain.  Symptoms ongoing for 2 days but reports she has been having intermittent episodes for a month.  Tonight the pain has become severe and constant.  It is sharp, stabbing, like a knife twisting in her upper abdomen.      Past Medical History:  Diagnosis Date   Depression    Hypertension     Patient Active Problem List   Diagnosis Date Noted   Dyspepsia 08/17/2019   Numbness of right foot 08/23/2018   Right leg weakness 08/23/2018    Past Surgical History:  Procedure Laterality Date   ABDOMINAL HYSTERECTOMY     BIOPSY  08/29/2019   Procedure: BIOPSY;  Surgeon: West Bali, MD;  Location: [facility];  Service: Endoscopy;;   CHOLECYSTECTOMY     ESOPHAGOGASTRODUODENOSCOPY N/A 08/29/2019   normal esophagus, localized moderate inflammation with adherent blood, congestion, erosions, and friability on greater curvature of stomach. Biopsy with chronic gastritis, negative H.pylori.    HEMORRHOID SURGERY       OB History   No obstetric history on file.     Family History  Problem Relation Age of Onset   Colon cancer Neg Hx    Colon polyps Neg Hx     Social History   Tobacco Use   Smoking status: Every Day    Packs/day: 0.50    Types: Cigarettes   Smokeless tobacco: Never  Vaping Use   Vaping Use: Former  Substance Use Topics   Alcohol use: No   Drug use: No    Home Medications Prior to Admission medications   Medication Sig Start Date End Date Taking? Authorizing Provider  pantoprazole (PROTONIX) 40 MG tablet Take 1 tablet (40 mg total) by mouth daily. 02/04/21  Yes Lajune Perine, Canary Brim, MD  sucralfate (CARAFATE) 1 GM/10ML suspension Take 10 mLs (1 g total) by mouth 4 (four)  times daily -  with meals and at bedtime. 02/04/21  Yes Demitra Danley, Canary Brim, MD  ALPRAZolam Prudy Feeler) 1 MG tablet Take 1 mg by mouth at bedtime.     [provider]  amLODipine (NORVASC) 10 MG tablet Take 10 mg by mouth daily.     [provider]  cloNIDine (CATAPRES) 0.2 MG tablet Take 0.2 mg by mouth 2 (two) times daily.     [provider]  escitalopram (LEXAPRO) 20 MG tablet Take 20 mg by mouth daily. 05/18/19   [provider]  hydrOXYzine (ATARAX/VISTARIL) 25 MG tablet Take 1 tablet (25 mg total) by mouth every 6 (six) hours. 01/09/21   Long, Arlyss Repress, MD  lisinopril (ZESTRIL) 20 MG tablet Take 20 mg by mouth daily.     [provider]    Allergies    Tetracyclines & related and Ultram [tramadol]  Review of Systems   Review of Systems  Gastrointestinal:  Positive for abdominal pain.  All other systems reviewed and are negative.  Physical Exam Updated Vital Signs BP (!) 152/91   Pulse 97   Temp 98.1 F (36.7 C)   Resp 15   Ht 5\' 2"  (1.575 m)   Wt 54.4 kg   SpO2 99%   BMI 21.95 kg/m   Physical Exam Vitals and nursing note  reviewed.  Constitutional:      General: She is not in acute distress.    Appearance: Normal appearance. She is well-developed.  HENT:     Head: Normocephalic and atraumatic.     Right Ear: Hearing normal.     Left Ear: Hearing normal.     Nose: Nose normal.  Eyes:     Conjunctiva/sclera: Conjunctivae normal.     Pupils: Pupils are equal, round, and reactive to light.  Cardiovascular:     Rate and Rhythm: Regular rhythm.     Heart sounds: S1 normal and S2 normal. No murmur heard.   No friction rub. No gallop.  Pulmonary:     Effort: Pulmonary effort is normal. No respiratory distress.     Breath sounds: Normal breath sounds.  Chest:     Chest wall: No tenderness.  Abdominal:     General: Bowel sounds are normal.     Palpations: Abdomen is soft.     Tenderness: There is abdominal tenderness in the  epigastric area. There is no guarding or rebound. Negative signs include Murphy's sign and McBurney's sign.     Hernia: No hernia is present.  Musculoskeletal:        General: Normal range of motion.     Cervical back: Normal range of motion and neck supple.  Skin:    General: Skin is warm and dry.     Findings: No rash.  Neurological:     Mental Status: She is alert and oriented to person, place, and time.     GCS: GCS eye subscore is 4. GCS verbal subscore is 5. GCS motor subscore is 6.     Cranial Nerves: No cranial nerve deficit.     Sensory: No sensory deficit.     Coordination: Coordination normal.  Psychiatric:        Speech: Speech normal.        Behavior: Behavior normal.        Thought Content: Thought content normal.    ED Results / Procedures / Treatments   Labs (all labs ordered are listed, but only abnormal results are displayed) Labs Reviewed  BASIC METABOLIC PANEL - Abnormal; Notable for the following components:      Result Value   Potassium 3.4 (*)    Glucose, Bld 113 (*)    Calcium 8.7 (*)    All other components within normal limits  CBC  LIPASE, BLOOD  HEPATIC FUNCTION PANEL  POC URINE PREG, ED  TROPONIN I (HIGH SENSITIVITY)  TROPONIN I (HIGH SENSITIVITY)    EKG None  Radiology DG Chest 2 View  Result Date: 02/03/2021 CLINICAL DATA:  Chest pain EXAM: CHEST - 2 VIEW COMPARISON:  January 09, 2021 FINDINGS: The heart size and mediastinal contours are within normal limits. No focal shadow projecting over the left lung field. No focal consolidation. No pleural effusion. No pneumothorax. The visualized skeletal structures are unremarkable. IMPRESSION: No active cardiopulmonary disease. Electronically Signed   By: Maudry Mayhew MD   On: 02/03/2021 21:53    Procedures Procedures   Medications Ordered in ED Medications  oxyCODONE-acetaminophen (PERCOCET/ROXICET) 5-325 MG per tablet 2 tablet (has no administration in time range)  pantoprazole (PROTONIX)  injection 40 mg (40 mg Intravenous Given 02/03/21 2243)  ondansetron (ZOFRAN) injection 4 mg (4 mg Intravenous Given 02/03/21 2243)  morphine 4 MG/ML injection 4 mg (4 mg Intravenous Given 02/04/21 0036)  alum & mag hydroxide-simeth (MAALOX/MYLANTA) 200-200-20 MG/5ML suspension 30 mL (30 mLs Oral Given 02/04/21  0034)    And  lidocaine (XYLOCAINE) 2 % viscous mouth solution 15 mL (15 mLs Oral Given 02/04/21 0035)    ED Course  I have reviewed the triage vital signs and the nursing notes.  Pertinent labs & imaging results that were available during my care of the patient were reviewed by me and considered in my medical decision making (see chart for details).    MDM Rules/Calculators/A&P                           Patient presents to the emergency department for upper abdominal pain.  Patient reports that she has been having problems on and off for more than a month but for the last 2 days has had persistent, severe pain.  She describes it as a burning pain in the center of her abdomen.  Reviewing her records reveals she had upper endoscopy last year that showed fairly severe gastritis.  This is likely the cause of her symptoms again tonight.  Cardiac evaluation is unremarkable and her symptoms are very atypical for cardiac etiology.  She has had previous cholecystectomy.  We will initiate aggressive treatment for gastritis and refer back to GI.  Final Clinical Impression(s) / ED Diagnoses Final diagnoses:  Acute gastritis without hemorrhage, unspecified gastritis type    Rx / DC Orders ED Discharge Orders          Ordered    pantoprazole (PROTONIX) 40 MG tablet  Daily        02/04/21 0156    sucralfate (CARAFATE) 1 GM/10ML suspension  3 times daily with meals & bedtime        02/04/21 0156             Gilda Crease, MD 02/04/21 [phone]

## 2021-02-05 ENCOUNTER — Telehealth: Payer: Self-pay | Admitting: Internal Medicine

## 2021-02-05 NOTE — Telephone Encounter (Signed)
ERROR

## 2021-02-06 ENCOUNTER — Telehealth: Payer: Self-pay | Admitting: Internal Medicine

## 2021-02-06 NOTE — Telephone Encounter (Signed)
Pt is aware of her OV in the next few weeks, but is passing black blood. I told her that we have nothing any sooner to come in and if it was bad that she needed to go to the ER. Patient doesn't want to go to the ER and wants to speak with Nurse. [phone]

## 2021-02-06 NOTE — Telephone Encounter (Signed)
Tried to call pt voicemail not set up

## 2021-02-07 NOTE — Telephone Encounter (Signed)
Agree, she needs to go to the ED if chest pain. Appears she was just in ED recently. Has been to ED multiple times. She is failing to thrive as outpatient and if only able to tolerate water, needs further evaluation.

## 2021-02-07 NOTE — Telephone Encounter (Signed)
Spoke to pt. She informed me that she is not feeling well. She can't eat,she is just drinking water. States her chest is burning in between her breast bone. Has had black stool for a couple of days. States she is not constipated the stool is formed and soft. She states no bleeding when she wipes. No fever. She has been nauseated, but no vomiting. Stated she has been taking phenergan. Advised pt to go to the ER for chest pains.

## 2021-02-07 NOTE — Telephone Encounter (Signed)
Call pt several times. She has not set up her voice mail.

## 2021-02-07 NOTE — Telephone Encounter (Signed)
Noted  

## 2021-02-07 NOTE — Telephone Encounter (Signed)
[name] taking care of this

## 2021-02-19 ENCOUNTER — Encounter: Payer: Self-pay | Admitting: *Deleted

## 2021-02-19 ENCOUNTER — Encounter: Payer: Self-pay | Admitting: Gastroenterology

## 2021-02-19 ENCOUNTER — Telehealth: Payer: Self-pay | Admitting: *Deleted

## 2021-02-19 ENCOUNTER — Other Ambulatory Visit: Payer: Self-pay

## 2021-02-19 ENCOUNTER — Ambulatory Visit (HOSPITAL_COMMUNITY)
Admission: RE | Admit: 2021-02-19 | Discharge: 2021-02-19 | Disposition: A | Discharge status: Home / Self Care | Payer: Self-pay | Source: Ambulatory Visit | Attending: Gastroenterology | Admitting: Gastroenterology

## 2021-02-19 ENCOUNTER — Ambulatory Visit (INDEPENDENT_AMBULATORY_CARE_PROVIDER_SITE_OTHER): Payer: Self-pay | Admitting: Gastroenterology

## 2021-02-19 VITALS — BP 137/80 | HR 63 | Temp 97.5°F | Ht 65.0 in | Wt 109.6 lb

## 2021-02-19 DIAGNOSIS — R1013 Epigastric pain: Secondary | ICD-10-CM

## 2021-02-19 DIAGNOSIS — R1010 Upper abdominal pain, unspecified: Secondary | ICD-10-CM | POA: Insufficient documentation

## 2021-02-19 MED ORDER — IOHEXOL 350 MG/ML SOLN
80.0000 mL | Freq: Once | INTRAVENOUS | Status: AC | PRN
  Administered 2021-02-19: 80 mL via INTRAVENOUS

## 2021-02-19 MED ORDER — LIDOCAINE VISCOUS HCL 2 % MT SOLN: 15.0000 mL | Freq: Four times a day (QID) | OROMUCOSAL | 2 refills | Status: DC | PRN

## 2021-02-19 NOTE — Telephone Encounter (Signed)
Patient walked into office. She just had CT done. Stated if she needed to go to the ER that Tobi Bastos would call so she can be admitted. Spoke with Tobi Bastos and if something shows up on CT we will call her. We need to wait for CT results.  Went back and told patient this and she left. She is aware we will call with results as soon as available.

## 2021-02-19 NOTE — H&P (View-Only) (Signed)
Referring Provider: Rebekah Chesterfield, NP Primary Care Physician:  Rebekah Chesterfield, NP Primary GI: Dr. Marletta Lor   Chief Complaint  Patient presents with   Weight Loss    Dry heaves, unable to eat, bleeding ulcers, pain under breast, burning sensation all way to stomach    HPI:   Marie Diaz is a 55 y.o. female presenting today with a history of dyspepsia, weight loss, s/p  EGD August 29, 2019: normal esophagus, localized moderate inflammation with adherent blood, congestion, erosions, and friability on greater curvature of stomach. Biopsy with chronic gastritis, negative H.pylori. Pancreatic lesion on CT in March 2021. MR abdomen March 2021 with 11 by 10 mm cystic lesion in tail of pancreas. Overdue for surveillance MRI.   Multiple ED presentations. Most recent early Aug 2022 with normal HFP, lipase, troponin, and CBC.   States she was weighing 120 but now 109 today. Weighed 120 about a month ago per patient. States pain is constant. Upper abdominal pain and radiates down. Not able to eat much. Only eating cantaloupe. Gets dry heaves. Crying at start of visit. States she can't live like this anymore. She had similar presentation a year ago but was lost to follow-up. Denies any suicidal ideation. Crying throughout visit. States even Morphine in ED does not help. Doesn't want to go home. Last CT without contrast Dec 2021 at outside facility. Review of ED records with presentation for anxiety and panic attacks. Does not appear she has been taking a PPI consistently until recent ED evaluation early Aug 2022 when prescribed again. Denies NSAIDs and aspirin powders.   Past Medical History:  Diagnosis Date   Depression    Hypertension     Past Surgical History:  Procedure Laterality Date   ABDOMINAL HYSTERECTOMY     BIOPSY  08/29/2019   Procedure: BIOPSY;  Surgeon: West Bali, MD;  Location: [facility];  Service: Endoscopy;;   CHOLECYSTECTOMY     ESOPHAGOGASTRODUODENOSCOPY N/A  08/29/2019   normal esophagus, localized moderate inflammation with adherent blood, congestion, erosions, and friability on greater curvature of stomach. Biopsy with chronic gastritis, negative H.pylori.    HEMORRHOID SURGERY      Current Outpatient Medications  Medication Sig Dispense Refill   ALPRAZolam (XANAX) 1 MG tablet Take 1 mg by mouth at bedtime.      amLODipine (NORVASC) 10 MG tablet Take 10 mg by mouth daily.      cloNIDine (CATAPRES) 0.2 MG tablet Take 0.2 mg by mouth 2 (two) times daily.      escitalopram (LEXAPRO) 20 MG tablet Take 20 mg by mouth daily.     lidocaine (XYLOCAINE) 2 % solution Use as directed 15 mLs in the mouth or throat every 6 (six) hours as needed (abdominal pain). 100 mL 2   lisinopril (ZESTRIL) 20 MG tablet Take 20 mg by mouth daily.      pantoprazole (PROTONIX) 40 MG tablet Take 1 tablet (40 mg total) by mouth daily. 30 tablet 3   sucralfate (CARAFATE) 1 GM/10ML suspension Take 10 mLs (1 g total) by mouth 4 (four) times daily -  with meals and at bedtime. 420 mL 0   No current facility-administered medications for this visit.    Allergies as of 02/19/2021 - Review Complete 02/19/2021  Allergen Reaction Noted   Tetracyclines & related Other (See Comments) 11/07/2015   Ultram [tramadol] Other (See Comments) 08/01/2012    Family History  Problem Relation Age of Onset   Colon cancer Neg  Hx    Colon polyps Neg Hx     Social History   Socioeconomic History   Marital status: Married    Spouse name: Not on file   Number of children: Not on file   Years of education: Not on file   Highest education level: Not on file  Occupational History   Not on file  Tobacco Use   Smoking status: Every Day    Packs/day: 0.10    Types: Cigarettes   Smokeless tobacco: Never  Vaping Use   Vaping Use: Former  Substance and Sexual Activity   Alcohol use: No   Drug use: No   Sexual activity: Not on file  Other Topics Concern   Not on file  Social History  Narrative   [phone]), kids(2). 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 SHE'S AN ONLY CHILD.   Social Determinants of Health   Financial Resource Strain: Not on file  Food Insecurity: Not on file  Transportation Needs: Not on file  Physical Activity: Not on file  Stress: Not on file  Social Connections: Not on file    Review of Systems: Gen: Denies fever, chills, anorexia. Denies fatigue, weakness, weight loss.  CV: Denies chest pain, palpitations, syncope, peripheral edema, and claudication. Resp: Denies dyspnea at rest, cough, wheezing, coughing up blood, and pleurisy. GI: see HPI Derm: Denies rash, itching, dry skin Psych: Denies depression, anxiety, memory loss, confusion. No homicidal or suicidal ideation.  Heme: Denies bruising, bleeding, and enlarged lymph nodes.  Physical Exam: BP 137/80   Pulse 63   Temp (!) 97.5 F (36.4 C) (Temporal)   Ht 5\' 5"  (1.651 m)   Wt 109 lb 9.6 oz (49.7 kg)   BMI 18.24 kg/m  General:   Alert and oriented. Tearful and anxious.  Head:  Normocephalic and atraumatic. Eyes:  Conjuctiva clear without scleral icterus. Mouth:  mask in place Abdomen:  +BS, soft, TTP epigastric predominantly and non-distended. No rebound or guarding. No HSM or masses noted. Msk:  Symmetrical without gross deformities. Normal posture. Extremities:  Without edema. Neurologic:  Alert and  oriented x4 Psych:  Alert and cooperative. Normal mood and affect.  Lab Results  Component Value Date   WBC 9.1 02/03/2021   HGB 14.5 02/03/2021   HCT 42.7 02/03/2021   MCV 91.4 02/03/2021   PLT 265 02/03/2021   Lab Results  Component Value Date   ALT 23 02/03/2021   AST 17 02/03/2021   ALKPHOS 74 02/03/2021   BILITOT 0.4 02/03/2021   Lab Results  Component Value Date   CREATININE 0.75 02/03/2021   BUN 10 02/03/2021   NA 140 02/03/2021   K 3.4 (L) 02/03/2021   CL 110 02/03/2021   CO2 25 02/03/2021   Lab Results   Component Value Date   LIPASE 38 02/03/2021     ASSESSMENT: Marie Diaz is a 55 y.o. female presenting today with history of chronic abdominal pain, now worsening from when last seen a year ago, with last EGD August 29, 2019: normal esophagus, localized moderate inflammation with adherent blood, congestion, erosions, and friability on greater curvature of stomach. Biopsy with chronic gastritis, negative H.pylori. Unclear if she has been consistently taking a PPI but has taken daily since August 7th ED presentation. Labs unrevealing. Her presentation seems out of proportion to exam findings.   I suspect anxiety playing a role but pursuing stat CT scan abd/pelvis today. I am tentatively  holding an EGD for diagnostic purposes next week. Will have her continue Protonix daily, continue Carafate, and I have added viscous lidocaine prn by mouth. She endorses avoiding NSAIDs. Does not appear to be consistent with chronic mesenteric ischemia: last year I had reviewed CT with radiology who noted patent vasculature. Gallbladder absent. Labs unrevealing.   Pancreatic lesion: on CT in March 2021. MR abdomen March 2021 with 11 by 10 mm cystic lesion in tail of pancreas. Overdue for surveillance MRI. Will address acute issues first.   Needs routine screening colonoscopy at some point once acute issues resolved.    PLAN:  Stat CT abd/pelvis with contrast today .Proceed with upper endoscopy by Dr. Marletta Lor next week, barring any significant CT findings: the risks, benefits, and alternatives have been discussed with the patient in detail. The patient states understanding and desires to proceed.  Continue PPI and Carafate Add viscous lidocaine MRI once over acute illness for pancreatic lesion surveillance Colonoscopy once over acute illness  Gelene Mink, PhD, ANP-BC [facility]

## 2021-02-19 NOTE — Patient Instructions (Signed)
I have ordered a stat CT scan for today. We will call with the results.  I am tentatively holding a spot for an endoscopy as soon as possible with Dr. Marletta Lor.  Continue with pantoprazole once each morning, 30 minutes before eating. Continue Carafate.  I have sent in an oral solution called viscous lidocaine to take every 6 hours for abdominal pain.   Please go to the ED if pain worsens.   I enjoyed seeing you again today! As you know, I value our relationship and want to provide genuine, compassionate, and quality care. I welcome your feedback. If you receive a survey regarding your visit,  I greatly appreciate you taking time to fill this out. See you next time!  Gelene Mink, PhD, ANP-BC [facility]

## 2021-02-19 NOTE — Progress Notes (Signed)
Referring Provider: Rebekah Chesterfield, NP Primary Care Physician:  Rebekah Chesterfield, NP Primary GI: Dr. Marletta Lor   Chief Complaint  Patient presents with   Weight Loss    Dry heaves, unable to eat, bleeding ulcers, pain under breast, burning sensation all way to stomach    HPI:   Marie Diaz is a 55 y.o. female presenting today with a history of dyspepsia, weight loss, s/p  EGD August 29, 2019: normal esophagus, localized moderate inflammation with adherent blood, congestion, erosions, and friability on greater curvature of stomach. Biopsy with chronic gastritis, negative H.pylori. Pancreatic lesion on CT in March 2021. MR abdomen March 2021 with 11 by 10 mm cystic lesion in tail of pancreas. Overdue for surveillance MRI.   Multiple ED presentations. Most recent early Aug 2022 with normal HFP, lipase, troponin, and CBC.   States she was weighing 120 but now 109 today. Weighed 120 about a month ago per patient. States pain is constant. Upper abdominal pain and radiates down. Not able to eat much. Only eating cantaloupe. Gets dry heaves. Crying at start of visit. States she can't live like this anymore. She had similar presentation a year ago but was lost to follow-up. Denies any suicidal ideation. Crying throughout visit. States even Morphine in ED does not help. Doesn't want to go home. Last CT without contrast Dec 2021 at outside facility. Review of ED records with presentation for anxiety and panic attacks. Does not appear she has been taking a PPI consistently until recent ED evaluation early Aug 2022 when prescribed again. Denies NSAIDs and aspirin powders.   Past Medical History:  Diagnosis Date   Depression    Hypertension     Past Surgical History:  Procedure Laterality Date   ABDOMINAL HYSTERECTOMY     BIOPSY  08/29/2019   Procedure: BIOPSY;  Surgeon: West Bali, MD;  Location: [facility];  Service: Endoscopy;;   CHOLECYSTECTOMY     ESOPHAGOGASTRODUODENOSCOPY N/A  08/29/2019   normal esophagus, localized moderate inflammation with adherent blood, congestion, erosions, and friability on greater curvature of stomach. Biopsy with chronic gastritis, negative H.pylori.    HEMORRHOID SURGERY      Current Outpatient Medications  Medication Sig Dispense Refill   ALPRAZolam (XANAX) 1 MG tablet Take 1 mg by mouth at bedtime.      amLODipine (NORVASC) 10 MG tablet Take 10 mg by mouth daily.      cloNIDine (CATAPRES) 0.2 MG tablet Take 0.2 mg by mouth 2 (two) times daily.      escitalopram (LEXAPRO) 20 MG tablet Take 20 mg by mouth daily.     lidocaine (XYLOCAINE) 2 % solution Use as directed 15 mLs in the mouth or throat every 6 (six) hours as needed (abdominal pain). 100 mL 2   lisinopril (ZESTRIL) 20 MG tablet Take 20 mg by mouth daily.      pantoprazole (PROTONIX) 40 MG tablet Take 1 tablet (40 mg total) by mouth daily. 30 tablet 3   sucralfate (CARAFATE) 1 GM/10ML suspension Take 10 mLs (1 g total) by mouth 4 (four) times daily -  with meals and at bedtime. 420 mL 0   No current facility-administered medications for this visit.    Allergies as of 02/19/2021 - Review Complete 02/19/2021  Allergen Reaction Noted   Tetracyclines & related Other (See Comments) 11/07/2015   Ultram [tramadol] Other (See Comments) 08/01/2012    Family History  Problem Relation Age of Onset   Colon cancer Neg  Hx    Colon polyps Neg Hx     Social History   Socioeconomic History   Marital status: Married    Spouse name: Not on file   Number of children: Not on file   Years of education: Not on file   Highest education level: Not on file  Occupational History   Not on file  Tobacco Use   Smoking status: Every Day    Packs/day: 0.10    Types: Cigarettes   Smokeless tobacco: Never  Vaping Use   Vaping Use: Former  Substance and Sexual Activity   Alcohol use: No   Drug use: No   Sexual activity: Not on file  Other Topics Concern   Not on file  Social History  Narrative   [phone]), kids(2). 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 SHE'S AN ONLY CHILD.   Social Determinants of Health   Financial Resource Strain: Not on file  Food Insecurity: Not on file  Transportation Needs: Not on file  Physical Activity: Not on file  Stress: Not on file  Social Connections: Not on file    Review of Systems: Gen: Denies fever, chills, anorexia. Denies fatigue, weakness, weight loss.  CV: Denies chest pain, palpitations, syncope, peripheral edema, and claudication. Resp: Denies dyspnea at rest, cough, wheezing, coughing up blood, and pleurisy. GI: see HPI Derm: Denies rash, itching, dry skin Psych: Denies depression, anxiety, memory loss, confusion. No homicidal or suicidal ideation.  Heme: Denies bruising, bleeding, and enlarged lymph nodes.  Physical Exam: BP 137/80   Pulse 63   Temp (!) 97.5 F (36.4 C) (Temporal)   Ht 5\' 5"  (1.651 m)   Wt 109 lb 9.6 oz (49.7 kg)   BMI 18.24 kg/m  General:   Alert and oriented. Tearful and anxious.  Head:  Normocephalic and atraumatic. Eyes:  Conjuctiva clear without scleral icterus. Mouth:  mask in place Abdomen:  +BS, soft, TTP epigastric predominantly and non-distended. No rebound or guarding. No HSM or masses noted. Msk:  Symmetrical without gross deformities. Normal posture. Extremities:  Without edema. Neurologic:  Alert and  oriented x4 Psych:  Alert and cooperative. Normal mood and affect.  Lab Results  Component Value Date   WBC 9.1 02/03/2021   HGB 14.5 02/03/2021   HCT 42.7 02/03/2021   MCV 91.4 02/03/2021   PLT 265 02/03/2021   Lab Results  Component Value Date   ALT 23 02/03/2021   AST 17 02/03/2021   ALKPHOS 74 02/03/2021   BILITOT 0.4 02/03/2021   Lab Results  Component Value Date   CREATININE 0.75 02/03/2021   BUN 10 02/03/2021   NA 140 02/03/2021   K 3.4 (L) 02/03/2021   CL 110 02/03/2021   CO2 25 02/03/2021   Lab Results   Component Value Date   LIPASE 38 02/03/2021     ASSESSMENT: Marie Diaz is a 55 y.o. female presenting today with history of chronic abdominal pain, now worsening from when last seen a year ago, with last EGD August 29, 2019: normal esophagus, localized moderate inflammation with adherent blood, congestion, erosions, and friability on greater curvature of stomach. Biopsy with chronic gastritis, negative H.pylori. Unclear if she has been consistently taking a PPI but has taken daily since August 7th ED presentation. Labs unrevealing. Her presentation seems out of proportion to exam findings.   I suspect anxiety playing a role but pursuing stat CT scan abd/pelvis today. I am tentatively  holding an EGD for diagnostic purposes next week. Will have her continue Protonix daily, continue Carafate, and I have added viscous lidocaine prn by mouth. She endorses avoiding NSAIDs. Does not appear to be consistent with chronic mesenteric ischemia: last year I had reviewed CT with radiology who noted patent vasculature. Gallbladder absent. Labs unrevealing.   Pancreatic lesion: on CT in March 2021. MR abdomen March 2021 with 11 by 10 mm cystic lesion in tail of pancreas. Overdue for surveillance MRI. Will address acute issues first.   Needs routine screening colonoscopy at some point once acute issues resolved.    PLAN:  Stat CT abd/pelvis with contrast today .Proceed with upper endoscopy by Dr. Marletta Lor next week, barring any significant CT findings: the risks, benefits, and alternatives have been discussed with the patient in detail. The patient states understanding and desires to proceed.  Continue PPI and Carafate Add viscous lidocaine MRI once over acute illness for pancreatic lesion surveillance Colonoscopy once over acute illness  Gelene Mink, PhD, ANP-BC [facility]

## 2021-02-26 ENCOUNTER — Other Ambulatory Visit: Payer: Self-pay

## 2021-02-26 ENCOUNTER — Ambulatory Visit (HOSPITAL_COMMUNITY)
Admission: RE | Admit: 2021-02-26 | Discharge: 2021-02-26 | Disposition: A | Discharge status: Home / Self Care | Payer: Self-pay | Attending: Internal Medicine | Admitting: Internal Medicine

## 2021-02-26 ENCOUNTER — Ambulatory Visit (HOSPITAL_COMMUNITY): Payer: Self-pay | Admitting: Anesthesiology

## 2021-02-26 ENCOUNTER — Encounter (HOSPITAL_COMMUNITY): Payer: Self-pay

## 2021-02-26 ENCOUNTER — Encounter (HOSPITAL_COMMUNITY)
Admission: RE | Disposition: A | Discharge status: Home / Self Care | Payer: Self-pay | Source: Home / Self Care | Attending: Internal Medicine

## 2021-02-26 ENCOUNTER — Telehealth: Payer: Self-pay

## 2021-02-26 ENCOUNTER — Ambulatory Visit: Payer: Self-pay

## 2021-02-26 DIAGNOSIS — Z881 Allergy status to other antibiotic agents status: Secondary | ICD-10-CM | POA: Insufficient documentation

## 2021-02-26 DIAGNOSIS — K2970 Gastritis, unspecified, without bleeding: Secondary | ICD-10-CM | POA: Insufficient documentation

## 2021-02-26 DIAGNOSIS — Z8719 Personal history of other diseases of the digestive system: Secondary | ICD-10-CM | POA: Insufficient documentation

## 2021-02-26 DIAGNOSIS — K30 Functional dyspepsia: Secondary | ICD-10-CM | POA: Insufficient documentation

## 2021-02-26 DIAGNOSIS — Z9049 Acquired absence of other specified parts of digestive tract: Secondary | ICD-10-CM | POA: Insufficient documentation

## 2021-02-26 DIAGNOSIS — F17210 Nicotine dependence, cigarettes, uncomplicated: Secondary | ICD-10-CM | POA: Insufficient documentation

## 2021-02-26 DIAGNOSIS — K319 Disease of stomach and duodenum, unspecified: Secondary | ICD-10-CM | POA: Insufficient documentation

## 2021-02-26 DIAGNOSIS — Z888 Allergy status to other drugs, medicaments and biological substances status: Secondary | ICD-10-CM | POA: Insufficient documentation

## 2021-02-26 DIAGNOSIS — Z90710 Acquired absence of both cervix and uterus: Secondary | ICD-10-CM | POA: Insufficient documentation

## 2021-02-26 DIAGNOSIS — I10 Essential (primary) hypertension: Secondary | ICD-10-CM | POA: Insufficient documentation

## 2021-02-26 DIAGNOSIS — Z79899 Other long term (current) drug therapy: Secondary | ICD-10-CM | POA: Insufficient documentation

## 2021-02-26 HISTORY — PX: ESOPHAGOGASTRODUODENOSCOPY (EGD) WITH PROPOFOL: SHX5813

## 2021-02-26 HISTORY — PX: BIOPSY: SHX5522

## 2021-02-26 SURGERY — ESOPHAGOGASTRODUODENOSCOPY (EGD) WITH PROPOFOL
Anesthesia: General

## 2021-02-26 MED ORDER — PROPOFOL 10 MG/ML IV BOLUS
INTRAVENOUS | Status: DC | PRN
  Administered 2021-02-26: 100 mg via INTRAVENOUS
  Administered 2021-02-26: 50 mg via INTRAVENOUS

## 2021-02-26 MED ORDER — LACTATED RINGERS IV SOLN: INTRAVENOUS | Status: DC

## 2021-02-26 MED ORDER — LIDOCAINE HCL (CARDIAC) PF 100 MG/5ML IV SOSY
PREFILLED_SYRINGE | INTRAVENOUS | Status: DC | PRN
  Administered 2021-02-26: 100 mg via INTRAVENOUS

## 2021-02-26 MED ORDER — PROPOFOL 10 MG/ML IV BOLUS
INTRAVENOUS | Status: AC
  Filled 2021-02-26: qty 40

## 2021-02-26 MED ORDER — ESOMEPRAZOLE MAGNESIUM 20 MG PO CPDR: 20.0000 mg | DELAYED_RELEASE_CAPSULE | Freq: Two times a day (BID) | ORAL | 5 refills | Status: DC

## 2021-02-26 NOTE — Anesthesia Postprocedure Evaluation (Signed)
Anesthesia Post Note  Patient: Marie Diaz  Procedure(s) Performed: ESOPHAGOGASTRODUODENOSCOPY (EGD) WITH PROPOFOL BIOPSY  Patient location during evaluation: Endoscopy Anesthesia Type: General Level of consciousness: awake and alert and oriented Pain management: pain level controlled Vital Signs Assessment: post-procedure vital signs reviewed and stable Respiratory status: spontaneous breathing and respiratory function stable Cardiovascular status: blood pressure returned to baseline and stable Postop Assessment: no apparent nausea or vomiting Anesthetic complications: no   No notable events documented.   Last Vitals:  Vitals:   02/26/21 0825 02/26/21 0911  BP: 126/86 (!) 90/58  Pulse: (!) 57   Resp: 16 16  Temp: 36.6 C 36.5 C  SpO2: 99% 99%    Last Pain:  Vitals:   02/26/21 0911  TempSrc: Oral  PainSc: 0-No pain                 Prentis Langdon C Thadeus Gandolfi

## 2021-02-26 NOTE — Interval H&P Note (Signed)
History and Physical Interval Note:  02/26/2021 8:56 AM  Marie Diaz  has presented today for surgery, with the diagnosis of dyspepsia.  The various methods of treatment have been discussed with the patient and family. After consideration of risks, benefits and other options for treatment, the patient has consented to  Procedure(s) with comments: ESOPHAGOGASTRODUODENOSCOPY (EGD) WITH PROPOFOL (N/A) - 9:30am as a surgical intervention.  The patient's history has been reviewed, patient examined, no change in status, stable for surgery.  I have reviewed the patient's chart and labs.  Questions were answered to the patient's satisfaction.     Lanelle Bal

## 2021-02-26 NOTE — Telephone Encounter (Signed)
Patient called and says she had an upper endoscopy today and her stomach has been hurting and burning since then. She says the pain is over a 10 and is hurting so bad that she's shakes. Denies pain in the chest, says it's upper abdomen and they told her she has ulcers and inflammation. She says she took the medications prescribed. She says she is also nauseated, no other symptoms. Advised to go to the ED, she says she may drive herself or call the ambulance. Advised however she chooses to get there, but it's recommended to go to the ED. Patient verbalized understanding.  Reason for Disposition  [1] SEVERE pain (e.g., excruciating) AND [2] present > 1 hour  Answer Assessment - Initial Assessment Questions 1. LOCATION: "Where does it hurt?"      Upper part 2. RADIATION: "Does the pain shoot anywhere else?" (e.g., chest, back)     No 3. ONSET: "When did the pain begin?" (e.g., minutes, hours or days ago)      Ongoing today since Upper endoscopy, worst pain 4. SUDDEN: "Gradual or sudden onset?"     Gradual all day 5. PATTERN "Does the pain come and go, or is it constant?"    - If constant: "Is it getting better, staying the same, or worsening?"      (Note: Constant means the pain never goes away completely; most serious pain is constant and it progresses)     - If intermittent: "How long does it last?" "Do you have pain now?"     (Note: Intermittent means the pain goes away completely between bouts)     Constant all day 6. SEVERITY: "How bad is the pain?"  (e.g., Scale 1-10; mild, moderate, or severe)    - MILD (1-3): doesn't interfere with normal activities, abdomen soft and not tender to touch     - MODERATE (4-7): interferes with normal activities or awakens from sleep, abdomen tender to touch     - SEVERE (8-10): excruciating pain, doubled over, unable to do any normal activities       Past a 10 7. RECURRENT SYMPTOM: "Have you ever had this type of stomach pain before?" If Yes, ask: "When  was the last time?" and "What happened that time?"      Yes 8. AGGRAVATING FACTORS: "Does anything seem to cause this pain?" (e.g., foods, stress, alcohol)     Just hurts 9. CARDIAC SYMPTOMS: "Do you have any of the following symptoms: chest pain, difficulty breathing, sweating, nausea?"     Nausea 10. OTHER SYMPTOMS: "Do you have any other symptoms?" (e.g., back pain, diarrhea, fever, urination pain, vomiting)       No 11. PREGNANCY: "Is there any chance you are pregnant?" "When was your last menstrual period?"       No  Protocols used: Abdominal Pain - Upper-A-AH

## 2021-02-26 NOTE — Transfer of Care (Signed)
Immediate Anesthesia Transfer of Care Note  Patient: Marie Diaz  Procedure(s) Performed: ESOPHAGOGASTRODUODENOSCOPY (EGD) WITH PROPOFOL BIOPSY  Patient Location: Endoscopy Unit  Anesthesia Type:General  Level of Consciousness: drowsy  Airway & Oxygen Therapy: Patient Spontanous Breathing  Post-op Assessment: Report given to RN and Post -op Vital signs reviewed and stable  Post vital signs: Reviewed and stable  Last Vitals:  Vitals Value Taken Time  BP    Temp    Pulse 59 02/26/21  0912  Resp 16 02/26/21 0912  SpO2 100 02/26/21  0912    Last Pain:  Vitals:   02/26/21 0902  TempSrc:   PainSc: 6       Patients Stated Pain Goal: 5 (02/26/21 0825)  Complications: No notable events documented.

## 2021-02-26 NOTE — Discharge Instructions (Addendum)
EGD Discharge instructions Please read the instructions outlined below and refer to this sheet in the next few weeks. These discharge instructions provide you with general information on caring for yourself after you leave the hospital. Your doctor may also give you specific instructions. While your treatment has been planned according to the most current medical practices available, unavoidable complications occasionally occur. If you have any problems or questions after discharge, please call your doctor. ACTIVITY You may resume your regular activity but move at a slower pace for the next 24 hours.  Take frequent rest periods for the next 24 hours.  Walking will help expel (get rid of) the air and reduce the bloated feeling in your abdomen.  No driving for 24 hours (because of the anesthesia (medicine) used during the test).  You may shower.  Do not sign any important legal documents or operate any machinery for 24 hours (because of the anesthesia used during the test).  NUTRITION Drink plenty of fluids.  You may resume your normal diet.  Begin with a light meal and progress to your normal diet.  Avoid alcoholic beverages for 24 hours or as instructed by your caregiver.  MEDICATIONS You may resume your normal medications unless your caregiver tells you otherwise.  WHAT YOU CAN EXPECT TODAY You may experience abdominal discomfort such as a feeling of fullness or "gas" pains.  FOLLOW-UP Your doctor will discuss the results of your test with you.  SEEK IMMEDIATE MEDICAL ATTENTION IF ANY OF THE FOLLOWING OCCUR: Excessive nausea (feeling sick to your stomach) and/or vomiting.  Severe abdominal pain and distention (swelling).  Trouble swallowing.  Temperature over 101 F (37.8 C).  Rectal bleeding or vomiting of blood.   Your EGD revealed a moderate amount of inflammation your stomach.  You also had 2 small ulcers in your stomach as well.  I took biopsies of this to rule out infection with a  bacteria called H. pylori.  I am going to change your pantoprazole to esomeprazole 20 mg twice daily.  This medication works best if you take it 30 minutes before breakfast and 30 minutes for dinner.  This is stronger than pantoprazole and should help heal the ulcers though this will take time.  Await pathology results, my office will contact you.  Follow-up with GI in 3 months.   I hope you have a great rest of your week!  Hennie Duos. Marletta Lor, D.O. Gastroenterology and Hepatology [facility]

## 2021-02-26 NOTE — Anesthesia Preprocedure Evaluation (Addendum)
Anesthesia Evaluation  Patient identified by MRN, date of birth, ID band Patient awake    Reviewed: Allergy & Precautions, NPO status , Patient's Chart, lab work & pertinent test results  Airway Mallampati: II  TM Distance: >3 FB Neck ROM: Full    Dental  (+) Dental Advisory Given, Chipped   Pulmonary Current Smoker and Patient abstained from smoking.,    Pulmonary exam normal breath sounds clear to auscultation       Cardiovascular Exercise Tolerance: Good hypertension, Pt. on medications Normal cardiovascular exam Rhythm:Regular Rate:Normal     Neuro/Psych PSYCHIATRIC DISORDERS Depression    GI/Hepatic negative GI ROS, Neg liver ROS,   Endo/Other  negative endocrine ROS  Renal/GU negative Renal ROS     Musculoskeletal negative musculoskeletal ROS (+)   Abdominal   Peds  Hematology negative hematology ROS (+)   Anesthesia Other Findings   Reproductive/Obstetrics negative OB ROS                            Anesthesia Physical Anesthesia Plan  ASA: 2  Anesthesia Plan: General   Post-op Pain Management:    Induction: Intravenous  PONV Risk Score and Plan: TIVA  Airway Management Planned: Nasal Cannula and Natural Airway  Additional Equipment:   Intra-op Plan:   Post-operative Plan:   Informed Consent: I have reviewed the patients History and Physical, chart, labs and discussed the procedure including the risks, benefits and alternatives for the proposed anesthesia with the patient or authorized representative who has indicated his/her understanding and acceptance.     Dental advisory given  Plan Discussed with: CRNA and Surgeon  Anesthesia Plan Comments:         Anesthesia Quick Evaluation

## 2021-02-26 NOTE — Telephone Encounter (Signed)
Pt just phoned advising she is in so much pain that she is thinking about calling a ambulance. She is stating her abdomen has been hurting since this morning after procedure. On a scale of 1-10 she said she is a 20. There is burning in the abdomen and the pt states she taken her PPI. No nausea/vomiting. I advised it may/maynot be today when we get back with her and advised if she is in that much pain then she needs to go to ED. Please advise

## 2021-02-26 NOTE — Op Note (Signed)
[facility] Patient Name: Marie Diaz Procedure Date: 02/26/2021 8:55 AM MRN: [id number] Date of Birth: April 27, 1966 Attending MD: Elon Alas. Edgar Frisk CSN: [id number] Age: 55 Admit Type: Outpatient Procedure:                Upper GI endoscopy Indications:              Epigastric abdominal pain, Functional Dyspepsia Providers:                Elon Alas. Abbey Chatters, DO, Crystal Page, Nelma Rothman,                            Technician Referring MD:              Medicines:                See the Anesthesia note for documentation of the                            administered medications Complications:            No immediate complications. Estimated Blood Loss:     Estimated blood loss was minimal. Procedure:                Pre-Anesthesia Assessment:                           - The anesthesia plan was to use monitored                            anesthesia care (MAC).                           After obtaining informed consent, the endoscope was                            passed under direct vision. Throughout the                            procedure, the patient's blood pressure, pulse, and                            oxygen saturations were monitored continuously. The                            [id number]-H190 ([id number]) scope was introduced through the                            mouth, and advanced to the second part of duodenum.                            The upper GI endoscopy was accomplished without                            difficulty. The patient tolerated the procedure                            well. Scope In: 9:05:44  AM Scope Out: 9:09:11 AM Total Procedure Duration: 0 hours 3 minutes 27 seconds  Findings:      There is no endoscopic evidence of Barrett's esophagus, bleeding, areas       of erosion, esophagitis, hiatal hernia, ulcerations or varices in the       entire esophagus.      The Z-line was regular and was found 38 cm from the incisors.      Localized moderate  inflammation characterized by erosions, erythema and       shallow ulcerations was found in the gastric antrum. Biopsies were taken       with a cold forceps for Helicobacter pylori testing.      The duodenal bulb, first portion of the duodenum and second portion of       the duodenum were normal. Impression:               - Z-line regular, 38 cm from the incisors.                           - Gastritis. Biopsied.                           - Normal duodenal bulb, first portion of the                            duodenum and second portion of the duodenum. Moderate Sedation:      Per Anesthesia Care Recommendation:           - Patient has a contact number available for                            emergencies. The signs and symptoms of potential                            delayed complications were discussed with the                            patient. Return to normal activities tomorrow.                            Written discharge instructions were provided to the                            patient.                           - Resume previous diet.                           - Continue present medications.                           - Await pathology results.                           - Return to GI clinic in 3 months.                           -  Use Nexium (esomeprazole) 20 mg PO BID.                           - No ibuprofen, naproxen, or other non-steroidal                            anti-inflammatory drugs. Procedure Code(s):        --- Professional ---                           [phone], Esophagogastroduodenoscopy, flexible,                            transoral; with biopsy, single or multiple Diagnosis Code(s):        --- Professional ---                           K29.70, Gastritis, unspecified, without bleeding                           R10.13, Epigastric pain                           K30, Functional dyspepsia CPT copyright 2019 [employer]. All rights reserved. The  codes documented in this report are preliminary and upon coder review may  be revised to meet current compliance requirements. Elon Alas. Abbey Chatters, DO [place] Chrsitopher Wik, DO 02/26/2021 9:14:34 AM This report has been signed electronically. Number of Addenda: 0

## 2021-02-27 LAB — SURGICAL PATHOLOGY

## 2021-02-27 NOTE — Telephone Encounter (Signed)
Pt returned call and I advised her per Dr. Marletta Lor to go to the ER with the amount of pain she is having. (Pt stated no one called her back and I told her I called at 3:48 pm and she didn't answer). So pt stated "ok"

## 2021-02-27 NOTE — Telephone Encounter (Signed)
Attempted to call the pt but her phone just continuously rang.

## 2021-02-27 NOTE — Telephone Encounter (Signed)
Pt phoned back again today stating she is still in so much pain. Pain level is over a 10. States abd hurts and burns so much that she vomitted up some yogurt this morning. I kept asking regarding her nausea/vomiting and she constantly spoke of her pain. Once again it was all about her pain. Pt was also advised by triage nurse @ 6:37 to come to ED to be evaluated. Please advise

## 2021-02-27 NOTE — Telephone Encounter (Signed)
If patient is having this much pain, then she needs to go to the ER.

## 2021-02-28 ENCOUNTER — Emergency Department (HOSPITAL_COMMUNITY)
Admission: EM | Admit: 2021-02-28 | Discharge: 2021-02-28 | Disposition: A | Discharge status: Home / Self Care | Payer: Self-pay | Attending: Student | Admitting: Student

## 2021-02-28 ENCOUNTER — Other Ambulatory Visit: Payer: Self-pay

## 2021-02-28 ENCOUNTER — Encounter (HOSPITAL_COMMUNITY): Payer: Self-pay

## 2021-02-28 ENCOUNTER — Emergency Department (HOSPITAL_COMMUNITY): Payer: Self-pay

## 2021-02-28 DIAGNOSIS — Z79899 Other long term (current) drug therapy: Secondary | ICD-10-CM | POA: Insufficient documentation

## 2021-02-28 DIAGNOSIS — F17210 Nicotine dependence, cigarettes, uncomplicated: Secondary | ICD-10-CM | POA: Insufficient documentation

## 2021-02-28 DIAGNOSIS — I10 Essential (primary) hypertension: Secondary | ICD-10-CM | POA: Insufficient documentation

## 2021-02-28 DIAGNOSIS — K279 Peptic ulcer, site unspecified, unspecified as acute or chronic, without hemorrhage or perforation: Secondary | ICD-10-CM | POA: Insufficient documentation

## 2021-02-28 DIAGNOSIS — K2900 Acute gastritis without bleeding: Secondary | ICD-10-CM | POA: Insufficient documentation

## 2021-02-28 LAB — CBC WITH DIFFERENTIAL/PLATELET
Abs Immature Granulocytes: 0.02 10*3/uL (ref 0.00–0.07)
Basophils Absolute: 0.1 10*3/uL (ref 0.0–0.1)
Basophils Relative: 1 %
Eosinophils Absolute: 0.1 10*3/uL (ref 0.0–0.5)
Eosinophils Relative: 1 %
HCT: 45.7 % (ref 36.0–46.0)
Hemoglobin: 15.7 g/dL — ABNORMAL HIGH (ref 12.0–15.0)
Immature Granulocytes: 0 %
Lymphocytes Relative: 28 %
Lymphs Abs: 2.4 10*3/uL (ref 0.7–4.0)
MCH: 31.4 pg (ref 26.0–34.0)
MCHC: 34.4 g/dL (ref 30.0–36.0)
MCV: 91.4 fL (ref 80.0–100.0)
Monocytes Absolute: 0.4 10*3/uL (ref 0.1–1.0)
Monocytes Relative: 5 %
Neutro Abs: 5.6 10*3/uL (ref 1.7–7.7)
Neutrophils Relative %: 65 %
Platelets: 264 10*3/uL (ref 150–400)
RBC: 5 MIL/uL (ref 3.87–5.11)
RDW: 11.9 % (ref 11.5–15.5)
WBC: 8.6 10*3/uL (ref 4.0–10.5)
nRBC: 0 % (ref 0.0–0.2)

## 2021-02-28 LAB — COMPREHENSIVE METABOLIC PANEL
ALT: 14 U/L (ref 0–44)
AST: 21 U/L (ref 15–41)
Albumin: 4.4 g/dL (ref 3.5–5.0)
Alkaline Phosphatase: 74 U/L (ref 38–126)
Anion gap: 8 (ref 5–15)
BUN: 8 mg/dL (ref 6–20)
CO2: 21 mmol/L — ABNORMAL LOW (ref 22–32)
Calcium: 8.7 mg/dL — ABNORMAL LOW (ref 8.9–10.3)
Chloride: 111 mmol/L (ref 98–111)
Creatinine, Ser: 0.79 mg/dL (ref 0.44–1.00)
GFR, Estimated: >60 mL/min (ref >60)
Glucose, Bld: 106 mg/dL — ABNORMAL HIGH (ref 70–99)
Potassium: 3.8 mmol/L (ref 3.5–5.1)
Sodium: 140 mmol/L (ref 135–145)
Total Bilirubin: 0.4 mg/dL (ref 0.3–1.2)
Total Protein: 7 g/dL (ref 6.5–8.1)

## 2021-02-28 LAB — URINALYSIS, ROUTINE W REFLEX MICROSCOPIC
Bacteria, UA: NONE SEEN
Bilirubin Urine: NEGATIVE
Glucose, UA: NEGATIVE mg/dL
Ketones, ur: 5 mg/dL — AB
Leukocytes,Ua: NEGATIVE
Nitrite: NEGATIVE
Protein, ur: NEGATIVE mg/dL
Specific Gravity, Urine: 1.001 — ABNORMAL LOW (ref 1.005–1.030)
pH: 8 (ref 5.0–8.0)

## 2021-02-28 LAB — LIPASE, BLOOD: Lipase: 26 U/L (ref 11–51)

## 2021-02-28 MED ORDER — SUCRALFATE 1 GM/10ML PO SUSP: 1.0000 g | Freq: Three times a day (TID) | ORAL | Status: DC

## 2021-02-28 MED ORDER — FAMOTIDINE IN NACL 20-0.9 MG/50ML-% IV SOLN
20.0000 mg | Freq: Once | INTRAVENOUS | Status: AC
  Administered 2021-02-28: 20 mg via INTRAVENOUS
  Filled 2021-02-28: qty 50

## 2021-02-28 MED ORDER — ALUM & MAG HYDROXIDE-SIMETH 400-400-40 MG/5ML PO SUSP: 10.0000 mL | Freq: Four times a day (QID) | ORAL | 0 refills | Status: DC | PRN

## 2021-02-28 MED ORDER — ALUM & MAG HYDROXIDE-SIMETH 200-200-20 MG/5ML PO SUSP
30.0000 mL | Freq: Once | ORAL | Status: AC
  Administered 2021-02-28: 30 mL via ORAL
  Filled 2021-02-28: qty 30

## 2021-02-28 MED ORDER — FAMOTIDINE 20 MG PO TABS: 20.0000 mg | ORAL_TABLET | Freq: Two times a day (BID) | ORAL | 0 refills | Status: DC

## 2021-02-28 MED ORDER — DROPERIDOL 2.5 MG/ML IJ SOLN
1.2500 mg | Freq: Once | INTRAMUSCULAR | Status: AC
  Administered 2021-02-28: 1.25 mg via INTRAVENOUS
  Filled 2021-02-28: qty 2

## 2021-02-28 MED ORDER — FENTANYL CITRATE PF 50 MCG/ML IJ SOSY
50.0000 ug | PREFILLED_SYRINGE | Freq: Once | INTRAMUSCULAR | Status: DC
Start: 2021-02-28 — End: 2021-02-28

## 2021-02-28 MED ORDER — LIDOCAINE VISCOUS HCL 2 % MT SOLN
15.0000 mL | Freq: Once | OROMUCOSAL | Status: AC
  Administered 2021-02-28: 15 mL via ORAL
  Filled 2021-02-28: qty 15

## 2021-02-28 MED ORDER — FENTANYL CITRATE PF 50 MCG/ML IJ SOSY
25.0000 ug | PREFILLED_SYRINGE | Freq: Once | INTRAMUSCULAR | Status: AC
  Administered 2021-02-28: 25 ug via INTRAVENOUS
  Filled 2021-02-28: qty 1

## 2021-02-28 MED ORDER — DIPHENHYDRAMINE HCL 50 MG/ML IJ SOLN
25.0000 mg | Freq: Once | INTRAMUSCULAR | Status: AC
  Administered 2021-02-28: 25 mg via INTRAVENOUS
  Filled 2021-02-28: qty 1

## 2021-02-28 NOTE — ED Notes (Signed)
Pt refusing to lay down for vital signs or EKG.

## 2021-02-28 NOTE — ED Provider Notes (Signed)
[facility] EMERGENCY DEPARTMENT Provider Note   CSN: [id number] Arrival date & time: 02/28/21  1507     History Chief Complaint  Patient presents with   Abdominal Pain    Marie Diaz is a 55 y.o. female with PMH gastritis, recent EGD today with biopsied gastric ulcers who presents the emergency department for severe epigastric pain.  She states that she has been taking her Nexium without relief.  She presents in significant abdominal distress and is very tender in the epigastric region.  Denies vomiting, hematemesis hematochezia or other concerning symptoms.   Abdominal Pain Associated symptoms: no chest pain, no chills, no cough, no dysuria, no fever, no hematuria, no shortness of breath, no sore throat and no vomiting       Past Medical History:  Diagnosis Date   Depression    Hypertension     Patient Active Problem List   Diagnosis Date Noted   Abdominal pain, epigastric 02/19/2021   Dyspepsia 08/17/2019   Numbness of right foot 08/23/2018   Right leg weakness 08/23/2018    Past Surgical History:  Procedure Laterality Date   ABDOMINAL HYSTERECTOMY     BIOPSY  08/29/2019   Procedure: BIOPSY;  Surgeon: West Bali, MD;  Location: [facility] ENDO SUITE;  Service: Endoscopy;;   CHOLECYSTECTOMY     ESOPHAGOGASTRODUODENOSCOPY N/A 08/29/2019   normal esophagus, localized moderate inflammation with adherent blood, congestion, erosions, and friability on greater curvature of stomach. Biopsy with chronic gastritis, negative H.pylori.    HEMORRHOID SURGERY       OB History   No obstetric history on file.     Family History  Problem Relation Age of Onset   Colon cancer Neg Hx    Colon polyps Neg Hx     Social History   Tobacco Use   Smoking status: Every Day    Packs/day: 0.10    Types: Cigarettes   Smokeless tobacco: Never  Vaping Use   Vaping Use: Former  Substance Use Topics   Alcohol use: No   Drug use: No    Home Medications Prior to Admission  medications   Medication Sig Start Date End Date Taking? Authorizing Provider  alum & mag hydroxide-simeth (MAALOX MAX) 400-400-40 MG/5ML suspension Take 10 mLs by mouth every 6 (six) hours as needed for indigestion. 02/28/21  Yes Hinton Luellen, MD  famotidine (PEPCID) 20 MG tablet Take 1 tablet (20 mg total) by mouth 2 (two) times daily. 02/28/21  Yes Dariel Betzer, MD  ALPRAZolam Prudy Feeler) 1 MG tablet Take 1 mg by mouth at bedtime.     [provider]  amLODipine (NORVASC) 10 MG tablet Take 10 mg by mouth daily.     [provider]  cloNIDine (CATAPRES) 0.2 MG tablet Take 0.2 mg by mouth 2 (two) times daily.     [provider]  escitalopram (LEXAPRO) 20 MG tablet Take 20 mg by mouth daily. 05/18/19   [provider]  esomeprazole (NEXIUM) 20 MG capsule Take 1 capsule (20 mg total) by mouth 2 (two) times daily before a meal. 02/26/21 08/25/21  Lanelle Bal, DO  lisinopril (ZESTRIL) 20 MG tablet Take 20 mg by mouth daily.     [provider]    Allergies    Tetracyclines & related and Ultram [tramadol]  Review of Systems   Review of Systems  Constitutional:  Negative for chills and fever.  HENT:  Negative for ear pain and sore throat.   Eyes:  Negative for  pain and visual disturbance.  Respiratory:  Negative for cough and shortness of breath.   Cardiovascular:  Negative for chest pain and palpitations.  Gastrointestinal:  Positive for abdominal pain. Negative for vomiting.  Genitourinary:  Negative for dysuria and hematuria.  Musculoskeletal:  Negative for arthralgias and back pain.  Skin:  Negative for color change and rash.  Neurological:  Negative for seizures and syncope.  All other systems reviewed and are negative.  Physical Exam Updated Vital Signs BP (!) 173/84   Pulse 65   Temp 98.3 F (36.8 C) (Oral)   Resp 13   Ht 5\' 5"  (1.651 m)   Wt 49.7 kg   SpO2 100%   BMI 18.23 kg/m   Physical Exam Vitals and nursing note  reviewed.  Constitutional:      General: She is not in acute distress.    Appearance: She is well-developed. She is ill-appearing.  HENT:     Head: Normocephalic and atraumatic.  Eyes:     Conjunctiva/sclera: Conjunctivae normal.  Cardiovascular:     Rate and Rhythm: Normal rate and regular rhythm.     Heart sounds: No murmur heard. Pulmonary:     Effort: Pulmonary effort is normal. No respiratory distress.     Breath sounds: Normal breath sounds.  Abdominal:     Palpations: Abdomen is soft.     Tenderness: There is abdominal tenderness in the epigastric area.  Musculoskeletal:     Cervical back: Neck supple.  Skin:    General: Skin is warm and dry.  Neurological:     Mental Status: She is alert.    ED Results / Procedures / Treatments   Labs (all labs ordered are listed, but only abnormal results are displayed) Labs Reviewed  CBC WITH DIFFERENTIAL/PLATELET - Abnormal; Notable for the following components:      Result Value   Hemoglobin 15.7 (*)    All other components within normal limits  COMPREHENSIVE METABOLIC PANEL - Abnormal; Notable for the following components:   CO2 21 (*)    Glucose, Bld 106 (*)    Calcium 8.7 (*)    All other components within normal limits  URINALYSIS, ROUTINE W REFLEX MICROSCOPIC - Abnormal; Notable for the following components:   Color, Urine COLORLESS (*)    Specific Gravity, Urine 1.001 (*)    Hgb urine dipstick SMALL (*)    Ketones, ur 5 (*)    All other components within normal limits  LIPASE, BLOOD    EKG None  Radiology DG Chest Portable 1 View  Result Date: 02/28/2021 CLINICAL DATA:  Severe epigastric pain after EGD yesterday EXAM: PORTABLE CHEST 1 VIEW COMPARISON:  Chest radiograph 02/03/2021 FINDINGS: The cardiomediastinal silhouette is normal. The lungs clear, with no focal consolidation or pulmonary edema. There is no pleural effusion or pneumothorax. There is no acute osseous abnormality. There is no free subdiaphragmatic  air. IMPRESSION: No free subdiaphragmatic air. Electronically Signed   By: 04/05/2021 M.D.   On: 02/28/2021 15:49    Procedures Procedures   Medications Ordered in ED Medications  sucralfate (CARAFATE) 1 GM/10ML suspension 1 g (1 g Oral Not Given 02/28/21 2230)  alum & mag hydroxide-simeth (MAALOX/MYLANTA) 200-200-20 MG/5ML suspension 30 mL (30 mLs Oral Given 02/28/21 1551)    And  lidocaine (XYLOCAINE) 2 % viscous mouth solution 15 mL (15 mLs Oral Given 02/28/21 1551)  famotidine (PEPCID) IVPB 20 mg premix (0 mg Intravenous Stopped 02/28/21 1628)  fentaNYL (SUBLIMAZE) injection 25 mcg (25 mcg Intravenous  Given 02/28/21 1552)  droperidol (INAPSINE) 2.5 MG/ML injection 1.25 mg (1.25 mg Intravenous Given 02/28/21 1806)  diphenhydrAMINE (BENADRYL) injection 25 mg (25 mg Intravenous Given 02/28/21 1803)  alum & mag hydroxide-simeth (MAALOX/MYLANTA) 200-200-20 MG/5ML suspension 30 mL (30 mLs Oral Given 02/28/21 2146)    And  lidocaine (XYLOCAINE) 2 % viscous mouth solution 15 mL (15 mLs Oral Given 02/28/21 2147)    ED Course  I have reviewed the triage vital signs and the nursing notes.  Pertinent labs & imaging results that were available during my care of the patient were reviewed by me and considered in my medical decision making (see chart for details).    MDM Rules/Calculators/A&P                           Family for evaluation of epigastric abdominal pain.  Physical exam reveals tenderness in the epigastric region but is otherwise unremarkable.  Laboratory evaluation and urinalysis is unremarkable.  Patient received multiple medications including an initial GI cocktail and Pepcid which minimally improved her symptoms but she had persistent abdominal pain on reevaluation.  Given 1.25 mg of droperidol that she states significantly helped her symptoms, but she is requesting an additional GI cocktail.  On third evaluation after her second GI cocktail, patient states that her pain has improved and she was  able to tolerate p.o. here in the emergency department without difficulty.  Her recent EGD ulcers were biopsied for H. pylori and her GI doctor should contact her if H. pylori positive for triple therapy with clarithromycin.  At this time, she will be discharged with a prescription for Maalox and Pepcid to be taken along with her Nexium and she was given return precautions which she voiced understanding.  Patient then discharged. Final Clinical Impression(s) / ED Diagnoses Final diagnoses:  Acute gastritis without hemorrhage, unspecified gastritis type  Peptic ulcer disease    Rx / DC Orders ED Discharge Orders          Ordered    famotidine (PEPCID) 20 MG tablet  2 times daily        02/28/21 2342    alum & mag hydroxide-simeth (MAALOX MAX) 400-400-40 MG/5ML suspension  Every 6 hours PRN        02/28/21 2342             Indio Santilli, Wyn Forster, MD 02/28/21 2346

## 2021-02-28 NOTE — ED Triage Notes (Signed)
BIB [facility] c/o severe abd pain following endo procedure yesterday. Recently dx with inflamed ulcers.

## 2021-03-06 ENCOUNTER — Encounter (HOSPITAL_COMMUNITY): Payer: Self-pay | Admitting: Internal Medicine

## 2021-03-10 ENCOUNTER — Inpatient Hospital Stay (HOSPITAL_COMMUNITY)
Admission: EM | Admit: 2021-03-10 | Discharge: 2021-03-13 | DRG: 392 | Disposition: A | Discharge status: Home / Self Care | Payer: Self-pay | Attending: Internal Medicine | Admitting: Internal Medicine

## 2021-03-10 ENCOUNTER — Encounter (HOSPITAL_COMMUNITY): Payer: Self-pay

## 2021-03-10 ENCOUNTER — Other Ambulatory Visit: Payer: Self-pay

## 2021-03-10 ENCOUNTER — Emergency Department (HOSPITAL_COMMUNITY): Payer: Self-pay

## 2021-03-10 DIAGNOSIS — R1110 Vomiting, unspecified: Secondary | ICD-10-CM

## 2021-03-10 DIAGNOSIS — R1013 Epigastric pain: Principal | ICD-10-CM | POA: Diagnosis present

## 2021-03-10 DIAGNOSIS — Z20822 Contact with and (suspected) exposure to covid-19: Secondary | ICD-10-CM | POA: Diagnosis present

## 2021-03-10 DIAGNOSIS — I10 Essential (primary) hypertension: Secondary | ICD-10-CM | POA: Diagnosis present

## 2021-03-10 DIAGNOSIS — F419 Anxiety disorder, unspecified: Secondary | ICD-10-CM | POA: Diagnosis present

## 2021-03-10 DIAGNOSIS — Z90710 Acquired absence of both cervix and uterus: Secondary | ICD-10-CM

## 2021-03-10 DIAGNOSIS — R197 Diarrhea, unspecified: Secondary | ICD-10-CM

## 2021-03-10 DIAGNOSIS — F17210 Nicotine dependence, cigarettes, uncomplicated: Secondary | ICD-10-CM | POA: Diagnosis present

## 2021-03-10 DIAGNOSIS — Z9049 Acquired absence of other specified parts of digestive tract: Secondary | ICD-10-CM

## 2021-03-10 DIAGNOSIS — R109 Unspecified abdominal pain: Secondary | ICD-10-CM | POA: Diagnosis present

## 2021-03-10 DIAGNOSIS — K2970 Gastritis, unspecified, without bleeding: Secondary | ICD-10-CM | POA: Diagnosis present

## 2021-03-10 DIAGNOSIS — D72828 Other elevated white blood cell count: Secondary | ICD-10-CM | POA: Diagnosis present

## 2021-03-10 DIAGNOSIS — Z79899 Other long term (current) drug therapy: Secondary | ICD-10-CM

## 2021-03-10 DIAGNOSIS — F32A Depression, unspecified: Secondary | ICD-10-CM | POA: Diagnosis present

## 2021-03-10 HISTORY — DX: Anxiety disorder, unspecified: F41.9

## 2021-03-10 LAB — CBC
HCT: 47.9 % — ABNORMAL HIGH (ref 36.0–46.0)
Hemoglobin: 16.2 g/dL — ABNORMAL HIGH (ref 12.0–15.0)
MCH: 31 pg (ref 26.0–34.0)
MCHC: 33.8 g/dL (ref 30.0–36.0)
MCV: 91.6 fL (ref 80.0–100.0)
Platelets: 299 10*3/uL (ref 150–400)
RBC: 5.23 MIL/uL — ABNORMAL HIGH (ref 3.87–5.11)
RDW: 12.1 % (ref 11.5–15.5)
WBC: 12.5 10*3/uL — ABNORMAL HIGH (ref 4.0–10.5)
nRBC: 0 % (ref 0.0–0.2)

## 2021-03-10 LAB — URINALYSIS, ROUTINE W REFLEX MICROSCOPIC
Bilirubin Urine: NEGATIVE
Glucose, UA: NEGATIVE mg/dL
Ketones, ur: 15 mg/dL — AB
Leukocytes,Ua: NEGATIVE
Nitrite: NEGATIVE
Protein, ur: NEGATIVE mg/dL
Specific Gravity, Urine: 1.01 (ref 1.005–1.030)
pH: 8 (ref 5.0–8.0)

## 2021-03-10 LAB — COMPREHENSIVE METABOLIC PANEL
ALT: 21 U/L (ref 0–44)
AST: 20 U/L (ref 15–41)
Albumin: 4.8 g/dL (ref 3.5–5.0)
Alkaline Phosphatase: 79 U/L (ref 38–126)
Anion gap: 10 (ref 5–15)
BUN: 10 mg/dL (ref 6–20)
CO2: 23 mmol/L (ref 22–32)
Calcium: 9.3 mg/dL (ref 8.9–10.3)
Chloride: 107 mmol/L (ref 98–111)
Creatinine, Ser: 0.9 mg/dL (ref 0.44–1.00)
GFR, Estimated: >60 mL/min (ref >60)
Glucose, Bld: 92 mg/dL (ref 70–99)
Potassium: 3.7 mmol/L (ref 3.5–5.1)
Sodium: 140 mmol/L (ref 135–145)
Total Bilirubin: 0.5 mg/dL (ref 0.3–1.2)
Total Protein: 7.5 g/dL (ref 6.5–8.1)

## 2021-03-10 LAB — URINALYSIS, MICROSCOPIC (REFLEX): Bacteria, UA: NONE SEEN

## 2021-03-10 LAB — LIPASE, BLOOD: Lipase: 38 U/L (ref 11–51)

## 2021-03-10 MED ORDER — LACTATED RINGERS IV BOLUS
1000.0000 mL | Freq: Once | INTRAVENOUS | Status: AC
  Administered 2021-03-10: 1000 mL via INTRAVENOUS

## 2021-03-10 MED ORDER — HYDROMORPHONE HCL 1 MG/ML IJ SOLN
1.0000 mg | Freq: Once | INTRAMUSCULAR | Status: AC
  Administered 2021-03-10: 1 mg via INTRAVENOUS
  Filled 2021-03-10: qty 1

## 2021-03-10 MED ORDER — FENTANYL CITRATE PF 50 MCG/ML IJ SOSY
50.0000 ug | PREFILLED_SYRINGE | Freq: Once | INTRAMUSCULAR | Status: AC
Start: 2021-03-10 — End: 2021-03-10
  Administered 2021-03-10: 50 ug via INTRAVENOUS
  Filled 2021-03-10: qty 1

## 2021-03-10 MED ORDER — IOHEXOL 350 MG/ML SOLN
100.0000 mL | Freq: Once | INTRAVENOUS | Status: AC | PRN
  Administered 2021-03-10: 75 mL via INTRAVENOUS

## 2021-03-10 MED ORDER — PANTOPRAZOLE SODIUM 40 MG IV SOLR
40.0000 mg | Freq: Once | INTRAVENOUS | Status: AC
  Administered 2021-03-10: 40 mg via INTRAVENOUS
  Filled 2021-03-10: qty 40

## 2021-03-10 MED ORDER — ONDANSETRON HCL 4 MG/2ML IJ SOLN
4.0000 mg | Freq: Once | INTRAMUSCULAR | Status: AC
  Administered 2021-03-10: 4 mg via INTRAVENOUS
  Filled 2021-03-10: qty 2

## 2021-03-10 NOTE — ED Triage Notes (Signed)
Pt arrived via [facility] c/o on-going epigastric abdominal pain. Pt had endoscopy in beginning of this month and was told she had inflamed ulcers. Pain is constant and Pt has been having persistent emesis and diarrhea. [facility] established 20g IV in Rt Hand and gave 4mg  Zofran PTA without relief.

## 2021-03-10 NOTE — ED Provider Notes (Signed)
[facility] EMERGENCY DEPARTMENT Provider Note   CSN: [id number] Arrival date & time: 03/10/21  1906     History Chief Complaint  Patient presents with   Abdominal Pain    Marie Diaz is a 55 y.o. female.  HPI 55 year old female presents with continued abdominal pain, vomiting, and diarrhea.  She has been dealing with this for months.  She recently had an EGD that showed gastritis.  She has been back and forth to the ER with uncontrolled pain.  She has been dealing with severe burning pain in her epigastrium ever since but today was worse and she called the on-call physician, Dr. Marletta Lor, and was told to come to the ER to be evaluated.  She feels weak.  No fevers, hematemesis, hematochezia. No chest pain. She's lost about 12 pounds.  Past Medical History:  Diagnosis Date   Anxiety    Depression    Hypertension     Patient Active Problem List   Diagnosis Date Noted   Abdominal pain 03/11/2021   Abdominal pain, epigastric 02/19/2021   Dyspepsia 08/17/2019   Numbness of right foot 08/23/2018   Right leg weakness 08/23/2018    Past Surgical History:  Procedure Laterality Date   ABDOMINAL HYSTERECTOMY     BIOPSY  08/29/2019   Procedure: BIOPSY;  Surgeon: West Bali, MD;  Location: [facility];  Service: Endoscopy;;   BIOPSY  02/26/2021   Procedure: BIOPSY;  Surgeon: Lanelle Bal, DO;  Location: [facility];  Service: Endoscopy;;   CHOLECYSTECTOMY     ESOPHAGOGASTRODUODENOSCOPY N/A 08/29/2019   normal esophagus, localized moderate inflammation with adherent blood, congestion, erosions, and friability on greater curvature of stomach. Biopsy with chronic gastritis, negative H.pylori.    ESOPHAGOGASTRODUODENOSCOPY (EGD) WITH PROPOFOL N/A 02/26/2021   Procedure: ESOPHAGOGASTRODUODENOSCOPY (EGD) WITH PROPOFOL;  Surgeon: Lanelle Bal, DO;  Location: [facility];  Service: Endoscopy;  Laterality: N/A;  9:30am   HEMORRHOID SURGERY       OB History   No  obstetric history on file.     Family History  Problem Relation Age of Onset   Colon cancer Neg Hx    Colon polyps Neg Hx     Social History   Tobacco Use   Smoking status: Every Day    Packs/day: 0.10    Types: Cigarettes   Smokeless tobacco: Never  Vaping Use   Vaping Use: Former  Substance Use Topics   Alcohol use: No   Drug use: No    Home Medications Prior to Admission medications   Medication Sig Start Date End Date Taking? Authorizing Provider  ALPRAZolam Prudy Feeler) 1 MG tablet Take 1 mg by mouth at bedtime.     [provider]  alum & mag hydroxide-simeth (MAALOX MAX) 400-400-40 MG/5ML suspension Take 10 mLs by mouth every 6 (six) hours as needed for indigestion. 02/28/21   Kommor, Madison, MD  amLODipine (NORVASC) 10 MG tablet Take 10 mg by mouth daily.     [provider]  cloNIDine (CATAPRES) 0.2 MG tablet Take 0.2 mg by mouth 2 (two) times daily.     [provider]  escitalopram (LEXAPRO) 20 MG tablet Take 20 mg by mouth daily. 05/18/19   [provider]  esomeprazole (NEXIUM) 20 MG capsule Take 1 capsule (20 mg total) by mouth 2 (two) times daily before a meal. 02/26/21 08/25/21  Lanelle Bal, DO  famotidine (PEPCID) 20 MG tablet Take 1 tablet (20 mg total) by mouth 2 (  two) times daily. 02/28/21   Kommor, Madison, MD  lisinopril (ZESTRIL) 20 MG tablet Take 20 mg by mouth daily.     [provider]    Allergies    Tetracyclines & related and Ultram [tramadol]  Review of Systems   Review of Systems  Constitutional:  Negative for fever.  Respiratory:  Negative for shortness of breath.   Cardiovascular:  Negative for chest pain.  Gastrointestinal:  Positive for abdominal pain, diarrhea and vomiting. Negative for blood in stool.  All other systems reviewed and are negative.  Physical Exam Updated Vital Signs BP 132/79   Pulse 75   Temp 97.8 F (36.6 C) (Oral)   Resp 18   Ht 5\' 5"  (1.651 m)   Wt 44 kg   SpO2  100%   BMI 16.14 kg/m   Physical Exam Vitals and nursing note reviewed.  Constitutional:      Appearance: She is well-developed.  HENT:     Head: Normocephalic and atraumatic.     Right Ear: External ear normal.     Left Ear: External ear normal.     Nose: Nose normal.  Eyes:     General:        Right eye: No discharge.        Left eye: No discharge.  Cardiovascular:     Rate and Rhythm: Normal rate and regular rhythm.     Heart sounds: Normal heart sounds.  Pulmonary:     Effort: Pulmonary effort is normal.     Breath sounds: Normal breath sounds.  Abdominal:     Palpations: Abdomen is soft.     Tenderness: There is abdominal tenderness in the right upper quadrant, epigastric area and left upper quadrant. There is guarding.  Skin:    General: Skin is warm and dry.  Neurological:     Mental Status: She is alert.  Psychiatric:        Mood and Affect: Mood is not anxious.    ED Results / Procedures / Treatments   Labs (all labs ordered are listed, but only abnormal results are displayed) Labs Reviewed  CBC - Abnormal; Notable for the following components:      Result Value   WBC 12.5 (*)    RBC 5.23 (*)    Hemoglobin 16.2 (*)    HCT 47.9 (*)    All other components within normal limits  URINALYSIS, ROUTINE W REFLEX MICROSCOPIC - Abnormal; Notable for the following components:   APPearance HAZY (*)    Hgb urine dipstick TRACE (*)    Ketones, ur 15 (*)    All other components within normal limits  RESP PANEL BY RT-PCR (FLU A&B, COVID) ARPGX2  LIPASE, BLOOD  COMPREHENSIVE METABOLIC PANEL  URINALYSIS, MICROSCOPIC (REFLEX)    EKG None  Radiology CT Angio Abd/Pel W and/or Wo Contrast  Result Date: 03/10/2021 CLINICAL DATA:  Epigastric abdominal pain, vomiting, diarrhea, inflamed ulcers on endoscopy, evaluate for mesenteric ischemia EXAM: CTA ABDOMEN AND PELVIS WITHOUT AND WITH CONTRAST TECHNIQUE: Multidetector CT imaging of the abdomen and pelvis was performed  using the standard protocol during bolus administration of intravenous contrast. Multiplanar reconstructed images and MIPs were obtained and reviewed to evaluate the vascular anatomy. CONTRAST:  65mL OMNIPAQUE IOHEXOL 350 MG/ML SOLN COMPARISON:  None. FINDINGS: VASCULAR Aorta: Patent.  Atherosclerotic calcifications. Celiac: Patent. SMA: Patent. Renals: Patent bilaterally. IMA: Patent. 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 Otherwise patent. Proximal Outflow: Patent.  Atherosclerotic calcifications. Veins: Portal vein, SMV, and IMV are patent. IVC and iliac veins are unremarkable. Review of the MIP images confirms the above findings. NON-VASCULAR Lower chest: Lung bases are clear. Hepatobiliary: Liver is within normal limits. Status post cholecystectomy. No intrahepatic or extrahepatic ductal dilatation. Pancreas: Known small pancreatic tail cyst is not well visualized, but may measure at least 6 mm (series 11/image 16), previously 11 mm. Spleen: Within normal limits. Adrenals/Urinary Tract: Adrenal glands are within normal limits. Kidneys are within normal limits.  No hydronephrosis. Bladder is within normal limits. Stomach/Bowel: Stomach is within normal limits. No evidence of bowel obstruction. Normal appendix (series 11/image 51). No bowel wall thickening or inflammatory changes. Lymphatic: No suspicious abdominopelvic lymphadenopathy. Reproductive: Status post hysterectomy. No adnexal masses. Other: No abdominopelvic ascites. Musculoskeletal: Visualized osseous structures are within normal limits. IMPRESSION: VASCULAR Patent mesenteric vessels. 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 NON-VASCULAR No evidence of bowel obstruction. Normal appendix. No bowel wall thickening or inflammatory changes. 6 mm pancreatic tail lesion, less conspicuous than on the prior, likely benign. Electronically Signed   By: Charline Bills M.D.   On: 03/10/2021 23:31    Procedures Procedures   Medications Ordered in ED Medications  lactated ringers bolus 1,000 mL (0 mLs Intravenous Stopped 03/10/21 2354)  fentaNYL (SUBLIMAZE) injection 50 mcg (50 mcg Intravenous Given 03/10/21 2215)  ondansetron (ZOFRAN) injection 4 mg (4 mg Intravenous Given 03/10/21 2214)  pantoprazole (PROTONIX) injection 40 mg (40 mg Intravenous Given 03/10/21 2215)  iohexol (OMNIPAQUE) 350 MG/ML injection 100 mL (75 mLs Intravenous Contrast Given 03/10/21 2306)  HYDROmorphone (DILAUDID) injection 1 mg (1 mg Intravenous Given 03/10/21 2352)    ED Course  I have reviewed the triage vital signs and the nursing notes.  Pertinent labs & imaging results that were available during my care of the patient were reviewed by me and considered in my medical decision making (see chart for details).    MDM Rules/Calculators/A&P                           I discussed her case with Dr. Marletta Lor. Unclear cause of her continued pain. One of his next tests would be CTA, which has been performed but no obvious cause. Given intractable pain and weight loss, GI agrees with admission for expedited workup. Consult GI in AM. Final Clinical Impression(s) / ED Diagnoses Final diagnoses:  Epigastric pain    Rx / DC Orders ED Discharge Orders     None        Pricilla Loveless, MD 03/11/21 [phone]

## 2021-03-10 NOTE — ED Notes (Signed)
PT to CT.

## 2021-03-11 ENCOUNTER — Encounter (HOSPITAL_COMMUNITY): Payer: Self-pay | Admitting: Internal Medicine

## 2021-03-11 ENCOUNTER — Other Ambulatory Visit: Payer: Self-pay

## 2021-03-11 DIAGNOSIS — K2970 Gastritis, unspecified, without bleeding: Secondary | ICD-10-CM

## 2021-03-11 DIAGNOSIS — K2900 Acute gastritis without bleeding: Secondary | ICD-10-CM

## 2021-03-11 DIAGNOSIS — R1110 Vomiting, unspecified: Secondary | ICD-10-CM

## 2021-03-11 DIAGNOSIS — I10 Essential (primary) hypertension: Secondary | ICD-10-CM

## 2021-03-11 DIAGNOSIS — R109 Unspecified abdominal pain: Secondary | ICD-10-CM | POA: Diagnosis present

## 2021-03-11 DIAGNOSIS — R197 Diarrhea, unspecified: Secondary | ICD-10-CM

## 2021-03-11 LAB — COMPREHENSIVE METABOLIC PANEL
ALT: 17 U/L (ref 0–44)
AST: 31 U/L (ref 15–41)
Albumin: 4.1 g/dL (ref 3.5–5.0)
Alkaline Phosphatase: 72 U/L (ref 38–126)
Anion gap: 11 (ref 5–15)
BUN: 9 mg/dL (ref 6–20)
CO2: 23 mmol/L (ref 22–32)
Calcium: 9.4 mg/dL (ref 8.9–10.3)
Chloride: 108 mmol/L (ref 98–111)
Creatinine, Ser: 0.88 mg/dL (ref 0.44–1.00)
GFR, Estimated: >60 mL/min (ref >60)
Glucose, Bld: 99 mg/dL (ref 70–99)
Potassium: 5.3 mmol/L — ABNORMAL HIGH (ref 3.5–5.1)
Sodium: 142 mmol/L (ref 135–145)
Total Bilirubin: 1.5 mg/dL — ABNORMAL HIGH (ref 0.3–1.2)
Total Protein: 6.7 g/dL (ref 6.5–8.1)

## 2021-03-11 LAB — CBC
HCT: 43.4 % (ref 36.0–46.0)
Hemoglobin: 14.5 g/dL (ref 12.0–15.0)
MCH: 30.9 pg (ref 26.0–34.0)
MCHC: 33.4 g/dL (ref 30.0–36.0)
MCV: 92.5 fL (ref 80.0–100.0)
Platelets: 236 10*3/uL (ref 150–400)
RBC: 4.69 MIL/uL (ref 3.87–5.11)
RDW: 12.1 % (ref 11.5–15.5)
WBC: 7.4 10*3/uL (ref 4.0–10.5)
nRBC: 0 % (ref 0.0–0.2)

## 2021-03-11 LAB — RESP PANEL BY RT-PCR (FLU A&B, COVID) ARPGX2
Influenza A by PCR: NEGATIVE
Influenza B by PCR: NEGATIVE
SARS Coronavirus 2 by RT PCR: NEGATIVE

## 2021-03-11 LAB — MAGNESIUM: Magnesium: 2.9 mg/dL — ABNORMAL HIGH (ref 1.7–2.4)

## 2021-03-11 LAB — CREATININE, URINE, RANDOM: Creatinine, Urine: 24.58 mg/dL

## 2021-03-11 LAB — HIV ANTIBODY (ROUTINE TESTING W REFLEX): HIV Screen 4th Generation wRfx: NONREACTIVE

## 2021-03-11 LAB — PHOSPHORUS: Phosphorus: 4.4 mg/dL (ref 2.5–4.6)

## 2021-03-11 LAB — PROTIME-INR
INR: 1.1 (ref 0.8–1.2)
Prothrombin Time: 14.3 seconds (ref 11.4–15.2)

## 2021-03-11 LAB — APTT: aPTT: 34 seconds (ref 24–36)

## 2021-03-11 LAB — RAPID URINE DRUG SCREEN, HOSP PERFORMED
Amphetamines: NOT DETECTED
Barbiturates: NOT DETECTED
Benzodiazepines: POSITIVE — AB
Cocaine: NOT DETECTED
Opiates: POSITIVE — AB
Tetrahydrocannabinol: NOT DETECTED

## 2021-03-11 LAB — TROPONIN I (HIGH SENSITIVITY)
Troponin I (High Sensitivity): 3 ng/L (ref <18)
Troponin I (High Sensitivity): <2 ng/L (ref <18)

## 2021-03-11 MED ORDER — ONDANSETRON HCL 4 MG/2ML IJ SOLN: 4.0000 mg | Freq: Four times a day (QID) | INTRAMUSCULAR | Status: DC

## 2021-03-11 MED ORDER — LIDOCAINE VISCOUS HCL 2 % MT SOLN
15.0000 mL | Freq: Once | OROMUCOSAL | Status: AC
  Administered 2021-03-11: 15 mL via ORAL
  Filled 2021-03-11: qty 15

## 2021-03-11 MED ORDER — AMITRIPTYLINE HCL 25 MG PO TABS
25.0000 mg | ORAL_TABLET | Freq: Every day | ORAL | Status: DC
  Administered 2021-03-11 – 2021-03-12 (×2): 25 mg via ORAL
  Filled 2021-03-11 (×2): qty 1

## 2021-03-11 MED ORDER — PANTOPRAZOLE SODIUM 40 MG IV SOLR
40.0000 mg | Freq: Two times a day (BID) | INTRAVENOUS | Status: DC
  Administered 2021-03-11 – 2021-03-13 (×5): 40 mg via INTRAVENOUS
  Filled 2021-03-11 (×5): qty 40

## 2021-03-11 MED ORDER — ALUM & MAG HYDROXIDE-SIMETH 200-200-20 MG/5ML PO SUSP
30.0000 mL | Freq: Once | ORAL | Status: AC
  Administered 2021-03-11: 30 mL via ORAL
  Filled 2021-03-11: qty 30

## 2021-03-11 MED ORDER — ONDANSETRON HCL 4 MG/2ML IJ SOLN
4.0000 mg | Freq: Four times a day (QID) | INTRAMUSCULAR | Status: DC | PRN
  Administered 2021-03-11 (×2): 4 mg via INTRAVENOUS
  Filled 2021-03-11 (×2): qty 2

## 2021-03-11 MED ORDER — ALPRAZOLAM 1 MG PO TABS
1.0000 mg | ORAL_TABLET | Freq: Three times a day (TID) | ORAL | Status: DC | PRN
  Administered 2021-03-11 – 2021-03-12 (×2): 1 mg via ORAL
  Filled 2021-03-11 (×2): qty 1

## 2021-03-11 MED ORDER — MORPHINE SULFATE (PF) 2 MG/ML IV SOLN
2.0000 mg | INTRAVENOUS | Status: DC | PRN
  Administered 2021-03-11 – 2021-03-13 (×4): 2 mg via INTRAVENOUS
  Filled 2021-03-11 (×4): qty 1

## 2021-03-11 MED ORDER — SODIUM CHLORIDE 0.9 % IV SOLN: Freq: Once | INTRAVENOUS | Status: AC

## 2021-03-11 MED ORDER — SODIUM CHLORIDE 0.9 % IV SOLN: INTRAVENOUS | Status: DC

## 2021-03-11 MED ORDER — DICYCLOMINE HCL 10 MG PO CAPS
10.0000 mg | ORAL_CAPSULE | Freq: Three times a day (TID) | ORAL | Status: DC
  Administered 2021-03-11 – 2021-03-13 (×8): 10 mg via ORAL
  Filled 2021-03-11 (×9): qty 1

## 2021-03-11 MED ORDER — ALPRAZOLAM 1 MG PO TABS: 1.0000 mg | ORAL_TABLET | Freq: Every day | ORAL | Status: DC

## 2021-03-11 MED ORDER — PROMETHAZINE HCL 25 MG/ML IJ SOLN
INTRAMUSCULAR | Status: AC
  Filled 2021-03-11: qty 1

## 2021-03-11 MED ORDER — CLONIDINE HCL 0.2 MG PO TABS
0.2000 mg | ORAL_TABLET | Freq: Two times a day (BID) | ORAL | Status: DC
  Administered 2021-03-11 – 2021-03-12 (×4): 0.2 mg via ORAL
  Filled 2021-03-11 (×4): qty 1

## 2021-03-11 MED ORDER — ONDANSETRON HCL 4 MG/2ML IJ SOLN
4.0000 mg | Freq: Four times a day (QID) | INTRAMUSCULAR | Status: DC
  Administered 2021-03-11 – 2021-03-13 (×7): 4 mg via INTRAVENOUS
  Filled 2021-03-11 (×7): qty 2

## 2021-03-11 MED ORDER — FAMOTIDINE IN NACL 20-0.9 MG/50ML-% IV SOLN
20.0000 mg | INTRAVENOUS | Status: DC
  Administered 2021-03-11 – 2021-03-13 (×3): 20 mg via INTRAVENOUS
  Filled 2021-03-11 (×3): qty 50

## 2021-03-11 MED ORDER — AMLODIPINE BESYLATE 5 MG PO TABS
10.0000 mg | ORAL_TABLET | Freq: Every day | ORAL | Status: DC
  Administered 2021-03-11 – 2021-03-13 (×3): 10 mg via ORAL
  Filled 2021-03-11 (×3): qty 2

## 2021-03-11 MED ORDER — SODIUM CHLORIDE 0.9 % IV SOLN
6.2500 mg | Freq: Four times a day (QID) | INTRAVENOUS | Status: DC | PRN
  Administered 2021-03-11: 6.25 mg via INTRAVENOUS
  Filled 2021-03-11 (×2): qty 0.25

## 2021-03-11 NOTE — Plan of Care (Signed)
  Problem: Education: Goal: Knowledge of General Education information will improve Description: Including pain rating scale, medication(s)/side effects and non-pharmacologic comfort measures Outcome: Progressing   Problem: Safety: Goal: Ability to remain free from injury will improve Outcome: Progressing   

## 2021-03-11 NOTE — Consult Note (Signed)
Referring Provider: Dr. Frankey Shown  Primary Care Physician:  Rebekah Chesterfield, NP Primary Gastroenterologist:  Dr. Marletta Lor  Date of Admission: 03/10/21 Date of Consultation: 03/11/21  Reason for Consultation:  Recurrent abdominal pain   HPI:  Marie Diaz is a 55 y.o. year old female with history of chronic abdominal pain, weight loss, multiple prior ED presentations for abdominal pain with negative evaluations thus far, seen as outpatient 02/19/21 for worsening epigastric pain and weight loss for several months. She underwent EGD Feb 26, 2021 with gastritis and pathology with reactive gastropathy and negative H.pylori. CT as outpatient without acute findings. Known history of pancreatic lesion with last MRI March 2021. Due for surveillance now. She has had failure to thrive as outpatient with persistent abdominal pain. CTA at time of presentation to the ED with patent mesenteric vessels. 6 mm pancreatic tail lesion appreciated on imaging.   Weight 44 kg today, previously 49.7 kg at office visit. Lipase normal. She notes feeling like a hot poker is in her upper abdomen and twisting. Reports chest pain as well. Worsened with eating. Associated nausea and vomiting. Abdominal pain is "all the time". States only relief she has is when sleeping. Has tolerated only small amounts of cantaloupe and yogurt. Denies NSAIDs. Taking Nexium 20 mg BID. Has postprandial stool but not diarrhea. No overt GI bleeding. States she can't live like this anymore. Denies any suicidal plan.   Gallbladder absent. Labs have been overall unremarkable. Troponin negative.   Past Medical History:  Diagnosis Date   Anxiety    Depression    Hypertension     Past Surgical History:  Procedure Laterality Date   ABDOMINAL HYSTERECTOMY     BIOPSY  08/29/2019   Procedure: BIOPSY;  Surgeon: West Bali, MD;  Location: [facility];  Service: Endoscopy;;   BIOPSY  02/26/2021   Procedure: BIOPSY;  Surgeon: Lanelle Bal, DO;  Location: [facility];  Service: Endoscopy;;   CHOLECYSTECTOMY     ESOPHAGOGASTRODUODENOSCOPY N/A 08/29/2019   normal esophagus, localized moderate inflammation with adherent blood, congestion, erosions, and friability on greater curvature of stomach. Biopsy with chronic gastritis, negative H.pylori.    ESOPHAGOGASTRODUODENOSCOPY (EGD) WITH PROPOFOL N/A 02/26/2021   gastritis and pathology with reactive gastropathy and negative H.pylori   HEMORRHOID SURGERY      Prior to Admission medications   Medication Sig Start Date End Date Taking? Authorizing Provider  ALPRAZolam Prudy Feeler) 1 MG tablet Take 1 mg by mouth at bedtime.    Yes [provider]  amLODipine (NORVASC) 10 MG tablet Take 10 mg by mouth daily.    Yes [provider]  cloNIDine (CATAPRES) 0.2 MG tablet Take 0.2 mg by mouth 2 (two) times daily.    Yes [provider]  esomeprazole (NEXIUM) 20 MG capsule Take 1 capsule (20 mg total) by mouth 2 (two) times daily before a meal. 02/26/21 08/25/21 Yes Carver, Charles K, DO  famotidine (PEPCID) 20 MG tablet Take 20 mg by mouth 2 (two) times daily.   Yes [provider]  lisinopril (ZESTRIL) 20 MG tablet Take 20 mg by mouth daily.    Yes [provider]    Current Facility-Administered Medications  Medication Dose Route Frequency Provider Last Rate Last Admin   amLODipine (NORVASC) tablet 10 mg  10 mg Oral Daily Adefeso, Oladapo, DO   10 mg at 03/11/21 1027   dicyclomine (BENTYL) capsule 10 mg  10 mg Oral TID AC & HS Lewie Loron  W, NP   10 mg at 03/11/21 1154   famotidine (PEPCID) IVPB 20 mg premix  20 mg Intravenous Q24H Adefeso, Oladapo, DO   Stopped at 03/11/21 0659   morphine 2 MG/ML injection 2 mg  2 mg Intravenous Q4H PRN Adefeso, Oladapo, DO   2 mg at 03/11/21 0915   ondansetron (ZOFRAN) injection 4 mg  4 mg Intravenous Q6H PRN Adefeso, Oladapo, DO   4 mg at 03/11/21 0914   pantoprazole (PROTONIX) injection 40 mg  40 mg  Intravenous Q12H Adefeso, Oladapo, DO   40 mg at 03/11/21 1027   promethazine (PHENERGAN) 6.25 mg in sodium chloride 0.9 % 50 mL IVPB  6.25 mg Intravenous Q6H PRN Dorcas CarrowGhimire, Kuber, MD 200 mL/hr at 03/11/21 1154 6.25 mg at 03/11/21 1154   Current Outpatient Medications  Medication Sig Dispense Refill   ALPRAZolam (XANAX) 1 MG tablet Take 1 mg by mouth at bedtime.      amLODipine (NORVASC) 10 MG tablet Take 10 mg by mouth daily.      cloNIDine (CATAPRES) 0.2 MG tablet Take 0.2 mg by mouth 2 (two) times daily.      esomeprazole (NEXIUM) 20 MG capsule Take 1 capsule (20 mg total) by mouth 2 (two) times daily before a meal. 60 capsule 5   famotidine (PEPCID) 20 MG tablet Take 20 mg by mouth 2 (two) times daily.     lisinopril (ZESTRIL) 20 MG tablet Take 20 mg by mouth daily.       Allergies as of 03/10/2021 - Review Complete 03/10/2021  Allergen Reaction Noted   Tetracyclines & related Other (See Comments) 11/07/2015   Ultram [tramadol] Other (See Comments) 08/01/2012    Family History  Problem Relation Age of Onset   Colon cancer Neg Hx    Colon polyps Neg Hx     Social History   Socioeconomic History   Marital status: Married    Spouse name: Not on file   Number of children: Not on file   Years of education: Not on file   Highest education level: Not on file  Occupational History   Not on file  Tobacco Use   Smoking status: Every Day    Packs/day: 0.10    Types: Cigarettes   Smokeless tobacco: Never  Vaping Use   Vaping Use: Former  Substance and Sexual Activity   Alcohol use: No   Drug use: No   Sexual activity: Not on file  Other Topics Concern   Not on file  Social History Narrative   [phone](1986), kids(2). 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 SHE'S AN ONLY CHILD.   Social Determinants of Health   Financial Resource Strain: Not on file  Food Insecurity: Not on file  Transportation Needs: Not on file  Physical  Activity: Not on file  Stress: Not on file  Social Connections: Not on file  Intimate Partner Violence: Not on file    Review of Systems: Gen: see HPI CV: see HPI Resp: Denies shortness of breath with rest, cough, wheezing GI: see HPI GU : Denies urinary burning, urinary frequency, urinary incontinence.  MS: Denies joint pain,swelling, cramping Derm: Denies rash, itching, dry skin Psych: see HPI Heme: Denies bruising, bleeding, and enlarged lymph nodes.  Physical Exam: Vital signs in last 24 hours: Temp:  [97.8 F (36.6 C)-98.4 F (36.9 C)] 97.8 F (36.6 C) (09/11 2115) Pulse Rate:  [65-130] 80 (09/12 1030) Resp:  [10-22] 17 (  09/12 1030) BP: (117-212)/(76-150) 157/88 (09/12 1030) SpO2:  [95 %-100 %] 99 % (09/12 1030) Weight:  [44 kg] 44 kg (09/11 1923)   General:   Alert, anxious, uncomfortable appearing, crying , difficult to stay focused Head:  Normocephalic and atraumatic. Eyes:  Sclera clear, no icterus.   Conjunctiva pink. Ears:  Normal auditory acuity. Nose:  No deformity, discharge,  or lesions. Mouth:  No deformity or lesions, dentition normal. Lungs:  Clear throughout to auscultation.    Heart:  S1 S2 present Abdomen:  Soft, TTP epigastric with minimal palpation. Out of proportion to exam. +carnett's sign Rectal:  Deferred  Msk:  Symmetrical without gross deformities. Normal posture. Extremities:  Without edema. Neurologic:  Alert and  oriented x4   Intake/Output from previous day: 09/11 0701 - 09/12 0700 In: 50 [IV Piggyback:50] Out: -  Intake/Output this shift: No intake/output data recorded.  Lab Results: Recent Labs    03/10/21 2200 03/11/21 0608  WBC 12.5* 7.4  HGB 16.2* 14.5  HCT 47.9* 43.4  PLT 299 236   BMET Recent Labs    03/10/21 2200 03/11/21 0457  NA 140 142  K 3.7 5.3*  CL 107 108  CO2 23 23  GLUCOSE 92 99  BUN 10 9  CREATININE 0.90 0.88  CALCIUM 9.3 9.4   LFT Recent Labs    03/10/21 2200 03/11/21 0457  PROT 7.5 6.7   ALBUMIN 4.8 4.1  AST 20 31  ALT 21 17  ALKPHOS 79 72  BILITOT 0.5 1.5*   PT/INR Recent Labs    03/11/21 0457  LABPROT 14.3  INR 1.1     Studies/Results: CT Angio Abd/Pel W and/or Wo Contrast  Result Date: 03/10/2021 CLINICAL DATA:  Epigastric abdominal pain, vomiting, diarrhea, inflamed ulcers on endoscopy, evaluate for mesenteric ischemia EXAM: CTA ABDOMEN AND PELVIS WITHOUT AND WITH CONTRAST TECHNIQUE: Multidetector CT imaging of the abdomen and pelvis was performed using the standard protocol during bolus administration of intravenous contrast. Multiplanar reconstructed images and MIPs were obtained and reviewed to evaluate the vascular anatomy. CONTRAST:  82mL OMNIPAQUE IOHEXOL 350 MG/ML SOLN COMPARISON:  None. FINDINGS: VASCULAR Aorta: Patent.  Atherosclerotic calcifications. Celiac: Patent. SMA: Patent. Renals: Patent bilaterally. IMA: Patent. 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 Otherwise patent. Proximal Outflow: Patent.  Atherosclerotic calcifications. Veins: Portal vein, SMV, and IMV are patent. IVC and iliac veins are unremarkable. Review of the MIP images confirms the above findings. NON-VASCULAR Lower chest: Lung bases are clear. Hepatobiliary: Liver is within normal limits. Status post cholecystectomy. No intrahepatic or extrahepatic ductal dilatation. Pancreas: Known small pancreatic tail cyst is not well visualized, but may measure at least 6 mm (series 11/image 16), previously 11 mm. Spleen: Within normal limits. Adrenals/Urinary Tract: Adrenal glands are within normal limits. Kidneys are within normal limits.  No hydronephrosis. Bladder is within normal limits. Stomach/Bowel: Stomach is within normal limits. No evidence of bowel obstruction. Normal appendix (series 11/image 51). No bowel wall thickening or inflammatory changes. Lymphatic: No suspicious abdominopelvic lymphadenopathy. Reproductive: Status post  hysterectomy. No adnexal masses. Other: No abdominopelvic ascites. Musculoskeletal: Visualized osseous structures are within normal limits. IMPRESSION: VASCULAR Patent mesenteric vessels. 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 NON-VASCULAR No evidence of bowel obstruction. Normal appendix. No bowel wall thickening or inflammatory changes. 6 mm pancreatic tail lesion, less conspicuous than on the prior, likely benign. Electronically Signed   By: Roselie Awkward.D.  On: 03/10/2021 23:31    Impression: 55 y.o. year old female with history of chronic abdominal pain, weight loss, multiple prior ED presentations for abdominal pain with negative evaluations thus far, undergoing EGD as outpatient for dyspepsia on 02/19/21 with gastritis and pathology with reactive gastropathy and negative H.pylori. CT as outpatient without acute findings. Overall, labs have been unremarkable, including LFTs and lipase. Gallbladder absent. CTA with patent mesenteric vasculature.   Worsening abdominal pain prompted ED presentation, and she has had documented weight loss since Aug 2022. Symptoms seem out of proportion to clinical, radiological, and laboratory findings but weight loss is concerning. Reported chest pain as well; doubt cardiac etiology but may need cardiology evaluation. Troponin negative. As of note, +Carnett's sign.   Suspect anxiety is also compounding symptoms. She has mentioned not wanting to live any longer due to the pain. We have reached out for a psych consult, and patient is also agreeable to this.   Discussed with Dr. Marletta Lor. Will check fecal elastase now. Known pancreatic tail lesion but should not be contributing to severity of symptoms. MRI recommended as outpatient for surveillance, as last was in 2021. Will add dicyclomine scheduled. GI cocktail X 1 now. Continue PPI BID. Add amitriptyline each evening. Will likely need evaluation at tertiary care facility.      Plan: PPI BID Clear liquids Dicyclomine 10 mg TID before meals and at bedtime: monitor for constipation GI cocktail X 1 now Scheduled Zofran Check fecal elastase Inpatient Psych consult Consider cardiology consultation Start low dose amitriptyline each evening Will continue to follow with you. Anticipate tertiary care referral as outpatient  Gelene Mink, PhD, ANP-BC [facility]     LOS: 0 days    03/11/2021, 12:19 PM

## 2021-03-11 NOTE — ED Notes (Signed)
Pt ok for ice by hospitalist

## 2021-03-11 NOTE — ED Notes (Signed)
Patient given breakfast tray at this time.

## 2021-03-11 NOTE — Progress Notes (Signed)
Patient seen and examined.  She was in the emergency room.  Admitted early morning hours by nighttime hospitalist. In brief 55 year old female with history of hypertension but no other medical issues, status postcholecystectomy, longstanding history of pancreatic tail tumor under surveillance, recently diagnosed gastritis with EGD on 8/30 presents to the emergency room with ongoing severe, intermittent abdominal pain mostly in the epigastrium.  She has been seen multiple times in the ER and as outpatient in GI office.  In the emergency room work-up was mostly normal.  Lipase normal.  Admitted for symptomatic treatment.  Generalized abdominal pain: Cause unknown.  Her abdominal pain is in access to her clinical findings and radiology Or systemic findings. CT scan with contrast 8/23 essentially normal. CT angiogram chest abdomen pelvis 9/11 essentially normal.  Widely patent mesenteric artery. EGD 8/30 with gastritis. Pancreatic tail tumor stable for last 6 years. Symptomatic treatment.  Adequate pain medications.  Clear liquid diet.  Nausea medications. Bentyl as antispasmodic medication.  Started on amitriptyline. Not likely angina.  No evidence of acute coronary syndrome or chronic angina. Gastroenterology following. Continue IV fluids.  We will try some angiolytics/benzodiazepine. Check urine drug screen. Pepcid IV twice daily. Will check porphyrin levels and urine.  Porphobilinogen, urine porphyrin and creatinine levels, however suspicion for acute intermittent porphyria is low.  Hypertension: Blood pressure fairly stable.  On amlodipine and clonidine.  Patient reported that she has too much pain to live, however denies any suicidal ideations, denies any plans.  No charge visit.

## 2021-03-11 NOTE — H&P (Signed)
History and Physical  Marie Diaz UJW:[id number] DOB: Dec 23, 1965 DOA: 03/10/2021  Referring physician: [name], [name], MD PCP: [name], [name], NP  Patient coming from: Home  Chief Complaint: Abdominal pain  HPI: Marie Diaz is a 55 y.o. female with medical history significant for hypertension, gastritis who presents to the emergency department due to about 2 months of abdominal pain, vomiting and diarrhea.  Abdominal pain in the upper abdominal quadrants was described as sharp and burning in nature.  Pain was rated as being severe, she also complained of lower abdominal pain which was crampy in nature.  Vomiting was nonbloody and was 2-3 times daily.  She had an EGD with Dr. [name] (gastroenterologist) on 8/30 which showed gastritis.  Patient has presented to the ED twice within the last month due to same abdominal pain.  Abdominal pain worsened today, so she called her gastroenterologist (Dr. [name]) and was told to go to the ER for further evaluation.  Patient complained of weakness and she states that she has lost about 12 pounds within last 2 months.  She denies chest pain, shortness of breath, fever, chills, blood in stool or vomiting of blood.  ED Course:  In the emergency department, she was hemodynamically stable, BP was 144/83 and other vital signs are within normal range.  Work-up in the ED showed leukocytosis and elevated H/H 16.2/47.9, CMP was normal, lipase 38, urinalysis was unimpressive for UTI.  Lenze 8, B, SARS coronavirus 2 was negative. CT angiogram PE of abdomen and pelvis showed patent mesenteric vessels and no evidence of bowel obstruction.  IV pain medication, Zofran and Protonix were given.  Patient was provided with IV hydration.  Hospitalist was asked to admit patient for further evaluation and management  Review of Systems: Constitutional: Negative for chills and fever.  HENT: Negative for ear pain and sore throat.   Eyes: Negative for pain and visual  disturbance.  Respiratory: Negative for cough, chest tightness and shortness of breath.   Cardiovascular: Negative for chest pain and palpitations.  Gastrointestinal: Positive for abdominal pain, diarrhea and vomiting.  Endocrine: Negative for polyphagia and polyuria.  Genitourinary: Negative for decreased urine volume, dysuria, enuresis Musculoskeletal: Negative for arthralgias and back pain.  Skin: Negative for color change and rash.  Allergic/Immunologic: Negative for immunocompromised state.  Neurological: Negative for tremors, syncope, speech difficulty Hematological: Does not bruise/bleed easily.  All other systems reviewed and are negative   Past Medical History:  Diagnosis Date   Anxiety    Depression    Hypertension    Past Surgical History:  Procedure Laterality Date   ABDOMINAL HYSTERECTOMY     BIOPSY  08/29/2019   Procedure: BIOPSY;  Surgeon: West BaliFields, Sandi L, MD;  Location: [facility];  Service: Endoscopy;;   BIOPSY  02/26/2021   Procedure: BIOPSY;  Surgeon: Lanelle Balarver, Charles K, DO;  Location: [facility];  Service: Endoscopy;;   CHOLECYSTECTOMY     ESOPHAGOGASTRODUODENOSCOPY N/A 08/29/2019   normal esophagus, localized moderate inflammation with adherent blood, congestion, erosions, and friability on greater curvature of stomach. Biopsy with chronic gastritis, negative H.pylori.    ESOPHAGOGASTRODUODENOSCOPY (EGD) WITH PROPOFOL N/A 02/26/2021   Procedure: ESOPHAGOGASTRODUODENOSCOPY (EGD) WITH PROPOFOL;  Surgeon: Lanelle Balarver, Charles K, DO;  Location: [facility];  Service: Endoscopy;  Laterality: N/A;  9:30am   HEMORRHOID SURGERY      Social History:  reports that she has been smoking cigarettes. She has been smoking an average of .1 packs per day. She has never used  smokeless tobacco. She reports that she does not drink alcohol and does not use drugs.   Allergies  Allergen Reactions   Tetracyclines & Related Other (See Comments)    Unknown reaction (patient does not  recall)   Ultram [Tramadol] Other (See Comments)    "knocks me out"    Family History  Problem Relation Age of Onset   Colon cancer Neg Hx    Colon polyps Neg Hx      Prior to Admission medications   Medication Sig Start Date End Date Taking? Authorizing Provider  ALPRAZolam Prudy Feeler) 1 MG tablet Take 1 mg by mouth at bedtime.     [provider]  alum & mag hydroxide-simeth (MAALOX MAX) 400-400-40 MG/5ML suspension Take 10 mLs by mouth every 6 (six) hours as needed for indigestion. 02/28/21   Kommor, Madison, MD  amLODipine (NORVASC) 10 MG tablet Take 10 mg by mouth daily.     [provider]  cloNIDine (CATAPRES) 0.2 MG tablet Take 0.2 mg by mouth 2 (two) times daily.     [provider]  escitalopram (LEXAPRO) 20 MG tablet Take 20 mg by mouth daily. 05/18/19   [provider]  esomeprazole (NEXIUM) 20 MG capsule Take 1 capsule (20 mg total) by mouth 2 (two) times daily before a meal. 02/26/21 08/25/21  Lanelle Bal, DO  famotidine (PEPCID) 20 MG tablet Take 1 tablet (20 mg total) by mouth 2 (two) times daily. 02/28/21   Kommor, Madison, MD  lisinopril (ZESTRIL) 20 MG tablet Take 20 mg by mouth daily.     [provider]    Physical Exam: BP 135/78   Pulse 70   Temp 97.8 F (36.6 C) (Oral)   Resp 13   Ht 5\' 5"  (1.651 m)   Wt 44 kg   SpO2 100%   BMI 16.14 kg/m   General: 55 y.o. year-old female well developed well nourished in no acute distress.  Alert and oriented x3. HEENT: NCAT, EOMI Neck: Supple, trachea medial Cardiovascular: Regular rate and rhythm with no rubs or gallops.  No thyromegaly or JVD noted.  No lower extremity edema. 2/4 pulses in all 4 extremities. Respiratory: Clear to auscultation with no wheezes or rales. Good inspiratory effort. Abdomen: Soft, diffuse abdominal tenderness to palpation with guarding in upper quadrants.  Normal bowel sounds x4 quadrants. Muskuloskeletal: No cyanosis, clubbing or edema noted  bilaterally Neuro: CN II-XII intact, strength 5/5 x 4, sensation, reflexes intact Skin: No ulcerative lesions noted or rashes Psychiatry: Judgement and insight appear normal. Mood is appropriate for condition and setting          Labs on Admission:  Basic Metabolic Panel: Recent Labs  Lab 03/10/21 2200  NA 140  K 3.7  CL 107  CO2 23  GLUCOSE 92  BUN 10  CREATININE 0.90  CALCIUM 9.3   Liver Function Tests: Recent Labs  Lab 03/10/21 2200  AST 20  ALT 21  ALKPHOS 79  BILITOT 0.5  PROT 7.5  ALBUMIN 4.8   Recent Labs  Lab 03/10/21 2200  LIPASE 38   No results for input(s): AMMONIA in the last 168 hours. CBC: Recent Labs  Lab 03/10/21 2200  WBC 12.5*  HGB 16.2*  HCT 47.9*  MCV 91.6  PLT 299   Cardiac Enzymes: No results for input(s): CKTOTAL, CKMB, CKMBINDEX, TROPONINI in the last 168 hours.  BNP (last 3 results) No results for input(s): BNP in the last 8760 hours.  ProBNP (last 3 results)  No results for input(s): PROBNP in the last 8760 hours.  CBG: No results for input(s): GLUCAP in the last 168 hours.  Radiological Exams on Admission: CT Angio Abd/Pel W and/or Wo Contrast  Result Date: 03/10/2021 CLINICAL DATA:  Epigastric abdominal pain, vomiting, diarrhea, inflamed ulcers on endoscopy, evaluate for mesenteric ischemia EXAM: CTA ABDOMEN AND PELVIS WITHOUT AND WITH CONTRAST TECHNIQUE: Multidetector CT imaging of the abdomen and pelvis was performed using the standard protocol during bolus administration of intravenous contrast. Multiplanar reconstructed images and MIPs were obtained and reviewed to evaluate the vascular anatomy. CONTRAST:  23mL OMNIPAQUE IOHEXOL 350 MG/ML SOLN COMPARISON:  None. FINDINGS: VASCULAR Aorta: Patent.  Atherosclerotic calcifications. Celiac: Patent. SMA: Patent. Renals: Patent bilaterally. IMA: Patent. 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 Otherwise patent. Proximal  Outflow: Patent.  Atherosclerotic calcifications. Veins: Portal vein, SMV, and IMV are patent. IVC and iliac veins are unremarkable. Review of the MIP images confirms the above findings. NON-VASCULAR Lower chest: Lung bases are clear. Hepatobiliary: Liver is within normal limits. Status post cholecystectomy. No intrahepatic or extrahepatic ductal dilatation. Pancreas: Known small pancreatic tail cyst is not well visualized, but may measure at least 6 mm (series 11/image 16), previously 11 mm. Spleen: Within normal limits. Adrenals/Urinary Tract: Adrenal glands are within normal limits. Kidneys are within normal limits.  No hydronephrosis. Bladder is within normal limits. Stomach/Bowel: Stomach is within normal limits. No evidence of bowel obstruction. Normal appendix (series 11/image 51). No bowel wall thickening or inflammatory changes. Lymphatic: No suspicious abdominopelvic lymphadenopathy. Reproductive: Status post hysterectomy. No adnexal masses. Other: No abdominopelvic ascites. Musculoskeletal: Visualized osseous structures are within normal limits. IMPRESSION: VASCULAR Patent mesenteric vessels. 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 NON-VASCULAR No evidence of bowel obstruction. Normal appendix. No bowel wall thickening or inflammatory changes. 6 mm pancreatic tail lesion, less conspicuous than on the prior, likely benign. Electronically Signed   By: Charline Bills M.D.   On: 03/10/2021 23:31    EKG: I independently viewed the EKG done and my findings are as followed: No EKG was done in the ED  Assessment/Plan Present on Admission:  Abdominal pain  Dyspepsia  Principal Problem:   Abdominal pain Active Problems:   Dyspepsia   Vomiting   Diarrhea   Essential hypertension   Gastritis  Abdominal pain, vomiting possibly secondary to multifactorial including gastritis, dyspepsia Patient complains of loss of 12 pounds within the last 2 months Continue  IV NS at 75 mLs/Hr Continue IV morphine 2 mg q.4h p.r.n. for moderate to severe pain Continue IV Protonix 40 mg twice daily Continue IV famotidine 20 mg daily Continue IV Zofran p.r.n. Continue clear liquid diet with plan to advance diet as tolerated Patient called gastroenterologist due to worsening abdominal pain today and she was asked to go to the ED for further evaluation and management. Gastroenterologist will be consulted and we shall await further recommendation.  Diarrhea Patient has not had any diarrhea since arrival to the ED Consider stool C. Diff and stool panel test if patient continues to have diarrhea  Essential hypertension Continue amlodipine  DVT prophylaxis: SCDs  Code Status: Full code  Family Communication: None at bedside  Disposition Plan:  Patient is from:                        home Anticipated DC to:  SNF or family members home Anticipated DC date:               2-3 days Anticipated DC barriers:         Patient requires inpatient management due to abdominal pain and vomiting requiring pending gastroenterology consult  Consults called: Gastroenterology  Admission status: Inpatient    Frankey Shown MD [facility]  03/11/2021, 4:17 AM

## 2021-03-11 NOTE — ED Notes (Signed)
Pt c/o nausea and dry heaves. Hospitalist notified via message

## 2021-03-11 NOTE — Progress Notes (Signed)
Patient admitted to room 314, ED RN reports patient made statements such as not wanting to live anymore. On admission, patient denies suicidal ideations. Stated "I don't want to harm myself or die, I just meant that anyone with this amount of pain would not want to keep living with it." Environmental check completed of room. Phone, chair alarm cords, plastic trash bags removed from room. Notified provider, states no need for sitter since denying suicidal ideations. Report given to Fanny Dance, RN receiving nurse on dept 300.

## 2021-03-12 DIAGNOSIS — R1010 Upper abdominal pain, unspecified: Secondary | ICD-10-CM

## 2021-03-12 DIAGNOSIS — R112 Nausea with vomiting, unspecified: Secondary | ICD-10-CM

## 2021-03-12 MED ORDER — PROSOURCE PLUS PO LIQD
30.0000 mL | Freq: Three times a day (TID) | ORAL | Status: DC
  Administered 2021-03-12 – 2021-03-13 (×3): 30 mL via ORAL
  Filled 2021-03-12 (×3): qty 30

## 2021-03-12 MED ORDER — BOOST / RESOURCE BREEZE PO LIQD CUSTOM
1.0000 | Freq: Three times a day (TID) | ORAL | Status: DC
  Administered 2021-03-12 – 2021-03-13 (×3): 1 via ORAL

## 2021-03-12 MED ORDER — ADULT MULTIVITAMIN W/MINERALS CH
1.0000 | ORAL_TABLET | Freq: Every day | ORAL | Status: DC
  Administered 2021-03-12 – 2021-03-13 (×2): 1 via ORAL
  Filled 2021-03-12 (×2): qty 1

## 2021-03-12 NOTE — BH Assessment (Signed)
Per Dr. Lucianne Muss, this pt cannot be assessed until medically cleared. It appears per chart notes, she is still medically admitted. Secure Chatted RN Windell Moulding to please contact us once she is medically cleared for assessment.

## 2021-03-12 NOTE — Progress Notes (Signed)
PROGRESS NOTE    Marie Diaz  WCH:[id number] DOB: 1965-08-23 DOA: 03/10/2021 PCP: Rebekah Chesterfield, NP    Brief Narrative:  55 year old female with history of hypertension but no other medical issues, status postcholecystectomy, longstanding history of pancreatic tail tumor under surveillance, recently diagnosed gastritis with EGD on 8/30 presents to the emergency room with ongoing severe, intermittent abdominal pain mostly in the epigastrium.  She has been seen multiple times in the ER and as outpatient in GI office.  In the emergency room work-up was mostly normal.  Lipase normal.  Admitted for symptomatic treatment.   Assessment & Plan:   Principal Problem:   Abdominal pain Active Problems:   Dyspepsia   Vomiting   Diarrhea   Essential hypertension   Gastritis  Generalized abdominal pain: Cause unknown.  Her abdominal pain is in excess to her clinical findings and radiology Or systemic findings. CT scan with contrast 8/23 essentially normal. CT angiogram chest abdomen pelvis 9/11 essentially normal.  Widely patent mesenteric artery. EGD 8/30 with gastritis. Pancreatic tail tumor stable for last 6 years. Symptomatic treatment.  Adequate pain medications.  Clear liquid diet.  Nausea medications. Bentyl as antispasmodic medication.  Started on amitriptyline. Not likely angina.  No evidence of acute coronary syndrome or chronic angina. Gastroenterology following. Continue IV fluids.  Continue Xanax from home. UDS with no significant findings.  She is on benzos and using opiates in the hospital. Pepcid IV twice daily. Will check porphyrin levels and urine.  Porphobilinogen, urine porphyrin and creatinine levels, however suspicion for acute intermittent porphyria is low.   Hypertension: Blood pressure fairly stable.  On amlodipine and clonidine.  Anxiety: Patient is on Xanax at home that is continued.  Amitriptyline was added at night and that is helpful. She expressed  concerns about too much pain to be alive, however there was no suicidal or homicidal ideation.  Seen by psychiatry and cleared.  Patient was challenged with solid food, she had recurrence of symptoms and could not tolerate solid food.  She will go back on clears.  Go back on IV fluids.  Need to monitor for improvement of symptoms.     DVT prophylaxis: SCDs Start: 03/11/21 0144   Code Status: Full code Family Communication: None Disposition Plan: Status is: Inpatient  Remains inpatient appropriate because:IV treatments appropriate due to intensity of illness or inability to take PO  Dispo: The patient is from: Home              Anticipated d/c is to: Home              Patient currently is not medically stable to d/c.   Difficult to place patient No         Consultants:  Gastroenterology  Procedures:  None  Antimicrobials:  None   Subjective: Patient seen and examined.  Early morning rounds, patient told me that she has no pain since yesterday.  Give her regular diet and she had recurrent pain and started dry heaving.  Nervous about going home.  Objective: Vitals:   03/11/21 1431 03/11/21 2037 03/12/21 0543 03/12/21 0914  BP: 135/74 113/69 122/70 126/71  Pulse: 89 64 (!) 56 (!) 58  Resp: 18 16 16    Temp: 98.5 F (36.9 C) 98.3 F (36.8 C) (!) 97.5 F (36.4 C)   TempSrc: Oral Oral    SpO2: 100% 97% 98% 97%  Weight:      Height:        Intake/Output Summary (Last  24 hours) at 03/12/2021 1418 Last data filed at 03/12/2021 0743 Gross per 24 hour  Intake 1018.92 ml  Output --  Net 1018.92 ml   Filed Weights   03/10/21 1923  Weight: 44 kg    Examination:  General exam: Appears calm and comfortable at rest. Anxious. Respiratory system: Clear to auscultation. Respiratory effort normal. Cardiovascular system: S1 & S2 heard, RRR. No JVD, murmurs, rubs, gallops or clicks. No pedal edema. Gastrointestinal system: Abdomen is nondistended, soft and mild  tenderness along the epigastrium. No organomegaly or masses felt. Normal bowel sounds heard. Central nervous system: Alert and oriented. No focal neurological deficits. Extremities: Symmetric 5 x 5 power.    Data Reviewed: I have personally reviewed following labs and imaging studies  CBC: Recent Labs  Lab 03/10/21 2200 03/11/21 0608  WBC 12.5* 7.4  HGB 16.2* 14.5  HCT 47.9* 43.4  MCV 91.6 92.5  PLT 299 236   Basic Metabolic Panel: Recent Labs  Lab 03/10/21 2200 03/11/21 0457  NA 140 142  K 3.7 5.3*  CL 107 108  CO2 23 23  GLUCOSE 92 99  BUN 10 9  CREATININE 0.90 0.88  CALCIUM 9.3 9.4  MG  --  2.9*  PHOS  --  4.4   GFR: Estimated Creatinine Clearance: 49.6 mL/min (by C-G formula based on SCr of 0.88 mg/dL). Liver Function Tests: Recent Labs  Lab 03/10/21 2200 03/11/21 0457  AST 20 31  ALT 21 17  ALKPHOS 79 72  BILITOT 0.5 1.5*  PROT 7.5 6.7  ALBUMIN 4.8 4.1   Recent Labs  Lab 03/10/21 2200  LIPASE 38   No results for input(s): AMMONIA in the last 168 hours. Coagulation Profile: Recent Labs  Lab 03/11/21 0457  INR 1.1   Cardiac Enzymes: No results for input(s): CKTOTAL, CKMB, CKMBINDEX, TROPONINI in the last 168 hours. BNP (last 3 results) No results for input(s): PROBNP in the last 8760 hours. HbA1C: No results for input(s): HGBA1C in the last 72 hours. CBG: No results for input(s): GLUCAP in the last 168 hours. Lipid Profile: No results for input(s): CHOL, HDL, LDLCALC, TRIG, CHOLHDL, LDLDIRECT in the last 72 hours. Thyroid Function Tests: No results for input(s): TSH, T4TOTAL, FREET4, T3FREE, THYROIDAB in the last 72 hours. Anemia Panel: No results for input(s): VITAMINB12, FOLATE, FERRITIN, TIBC, IRON, RETICCTPCT in the last 72 hours. Sepsis Labs: No results for input(s): PROCALCITON, LATICACIDVEN in the last 168 hours.  Recent Results (from the past 240 hour(s))  Resp Panel by RT-PCR (Flu A&B, Covid) Nasopharyngeal Swab     Status:  None   Collection Time: 03/10/21 11:52 PM   Specimen: Nasopharyngeal Swab; Nasopharyngeal(NP) swabs in vial transport medium  Result Value Ref Range Status   SARS Coronavirus 2 by RT PCR NEGATIVE NEGATIVE Final    Comment: (NOTE) SARS-CoV-2 target nucleic acids are NOT DETECTED.  The SARS-CoV-2 RNA is generally detectable in upper respiratory specimens during the acute phase of infection. The lowest concentration of SARS-CoV-2 viral copies this assay can detect is 138 copies/mL. A negative result does not preclude SARS-Cov-2 infection and should not be used as the sole basis for treatment or other patient management decisions. A negative result may occur with  improper specimen collection/handling, submission of specimen other than nasopharyngeal swab, presence of viral mutation(s) within the areas targeted by this assay, and inadequate number of viral copies(<138 copies/mL). A negative result must be combined with clinical observations, patient history, and epidemiological information. The expected result is Negative.  Fact Sheet for Patients:  BloggerCourse.com  Fact Sheet for Healthcare Providers:  SeriousBroker.it  This test is no t yet approved or cleared by the [place] FDA and  has been authorized for detection and/or diagnosis of SARS-CoV-2 by FDA under an Emergency Use Authorization (EUA). This EUA will remain  in effect (meaning this test can be used) for the duration of the COVID-19 declaration under Section 564(b)(1) of the Act, 21 U.S.C.section 360bbb-3(b)(1), unless the authorization is terminated  or revoked sooner.       Influenza A by PCR NEGATIVE NEGATIVE Final   Influenza B by PCR NEGATIVE NEGATIVE Final    Comment: (NOTE) The Xpert Xpress SARS-CoV-2/FLU/RSV plus assay is intended as an aid in the diagnosis of influenza from Nasopharyngeal swab specimens and should not be used as a sole basis for  treatment. Nasal washings and aspirates are unacceptable for Xpert Xpress SARS-CoV-2/FLU/RSV testing.  Fact Sheet for Patients: BloggerCourse.com  Fact Sheet for Healthcare Providers: SeriousBroker.it  This test is not yet approved or cleared by the [place] FDA and has been authorized for detection and/or diagnosis of SARS-CoV-2 by FDA under an Emergency Use Authorization (EUA). This EUA will remain in effect (meaning this test can be used) for the duration of the COVID-19 declaration under Section 564(b)(1) of the Act, 21 U.S.C. section 360bbb-3(b)(1), unless the authorization is terminated or revoked.  Performed at [facility], [address] [place], [place] [address]          Radiology Studies: CT Angio Abd/Pel W and/or Wo Contrast  Result Date: 03/10/2021 CLINICAL DATA:  Epigastric abdominal pain, vomiting, diarrhea, inflamed ulcers on endoscopy, evaluate for mesenteric ischemia EXAM: CTA ABDOMEN AND PELVIS WITHOUT AND WITH CONTRAST TECHNIQUE: Multidetector CT imaging of the abdomen and pelvis was performed using the standard protocol during bolus administration of intravenous contrast. Multiplanar reconstructed images and MIPs were obtained and reviewed to evaluate the vascular anatomy. CONTRAST:  59mL OMNIPAQUE IOHEXOL 350 MG/ML SOLN COMPARISON:  None. FINDINGS: VASCULAR Aorta: Patent.  Atherosclerotic calcifications. Celiac: Patent. SMA: Patent. Renals: Patent bilaterally. IMA: Patent. 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 Otherwise patent. Proximal Outflow: Patent.  Atherosclerotic calcifications. Veins: Portal vein, SMV, and IMV are patent. IVC and iliac veins are unremarkable. Review of the MIP images confirms the above findings. NON-VASCULAR Lower chest: Lung bases are clear. Hepatobiliary: Liver is within normal limits. Status post cholecystectomy. No  intrahepatic or extrahepatic ductal dilatation. Pancreas: Known small pancreatic tail cyst is not well visualized, but may measure at least 6 mm (series 11/image 16), previously 11 mm. Spleen: Within normal limits. Adrenals/Urinary Tract: Adrenal glands are within normal limits. Kidneys are within normal limits.  No hydronephrosis. Bladder is within normal limits. Stomach/Bowel: Stomach is within normal limits. No evidence of bowel obstruction. Normal appendix (series 11/image 51). No bowel wall thickening or inflammatory changes. Lymphatic: No suspicious abdominopelvic lymphadenopathy. Reproductive: Status post hysterectomy. No adnexal masses. Other: No abdominopelvic ascites. Musculoskeletal: Visualized osseous structures are within normal limits. IMPRESSION: VASCULAR Patent mesenteric vessels. 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 NON-VASCULAR No evidence of bowel obstruction. Normal appendix. No bowel wall thickening or inflammatory changes. 6 mm pancreatic tail lesion, less conspicuous than on the prior, likely benign. Electronically Signed   By: Charline Bills M.D.   On: 03/10/2021 23:31        Scheduled Meds:  (feeding supplement) PROSource Plus  30 mL Oral TID  BM   amitriptyline  25 mg Oral QHS   amLODipine  10 mg Oral Daily   cloNIDine  0.2 mg Oral BID   dicyclomine  10 mg Oral TID AC & HS   feeding supplement  1 Container Oral TID BM   multivitamin with minerals  1 tablet Oral Daily   ondansetron (ZOFRAN) IV  4 mg Intravenous Q6H   pantoprazole (PROTONIX) IV  40 mg Intravenous Q12H   Continuous Infusions:  sodium chloride 75 mL/hr at 03/12/21 0743   famotidine (PEPCID) IV Stopped (03/12/21 0606)   promethazine (PHENERGAN) injection (IM or IVPB) Stopped (03/11/21 1243)     LOS: 1 day    Time spent: 30 minutes     Dorcas Carrow, MD [facility] Pager [phone]

## 2021-03-12 NOTE — BH Assessment (Signed)
Comprehensive Clinical Assessment (CCA) Note  03/12/2021 TOL[place] MEAS [id number]  Disposition: Per Dorena Bodo, NP, patient is psychiatrically cleared.   Flowsheet Row ED to Hosp-Admission (Current) from 03/10/2021 in [facility] SURGICAL UNIT ED from 02/28/2021 in [facility] EMERGENCY DEPARTMENT ED from 02/03/2021 in [facility] EMERGENCY DEPARTMENT  C-SSRS RISK CATEGORY No Risk No Risk No Risk      The patient demonstrates the following risk factors for suicide: Chronic risk factors for suicide include: psychiatric disorder of GAD, depression and chronic pain. Acute risk factors for suicide include: N/A. Protective factors for this patient include: positive social support, positive therapeutic relationship, and responsibility to others (children, family). Considering these factors, the overall suicide risk at this point appears to be low. Patient is appropriate for outpatient follow up.   Marie Diaz is a 55 year old female presenting to [facility] voluntarily with chief complaint of "on-going epigastric abdominal pain. Pt had endoscopy in beginning of this month and was told she had inflamed ulcers. Pain is constant and Pt has been having persistent emesis and diarrhea." Pt was ultimately admitted into the hospital and made a statement to ED RN "reports patient made statements such as not wanting to live anymore. On admission, patient denies suicidal ideations. Stated "I don't want to harm myself or die, I just meant that anyone with this amount of pain would not want to keep living with it."  Patient is oriented x4, engaged, alert and cooperative. Patient eye contact and tone of voices is normal. Patient affect is normal with congruent mood. Patient reports that she has been in a significant amount of pain that seems to be worsening for the last 2 months due to gastritis and ulcers. Patient reports making a statement in the ED that "sometimes I wish I die because of the pain I am in". Patient  acknowledges that she does not want to die, and she does not want to kill herself. Patient reports making said statement while she was having a lot of pain.     Patient reports diagnosis of anxiety and depression and denies being depressed but reports all her issues are a result of the pain she is going through. Patient does not see a psychiatrist or therapist however she sees her PCP for medications and reports taking Xanax and Lexapro. Patient reports she has been on Xanax for 15 years and reports she has taken Lexapro for about 3 months. Patient denies history of inpatient treatment. Patient lives at home with her husband and 11 year old son and reports having a shotgun that is safely secure.  Patient reports stressors of having to take care of her mother who was diagnosed with Alzheimer's, her father who had a bad fall, and her husband who had a stroke.   Patient denies SI, HI, AVH and substance use. Patient has never attempted suicide and contracts for safety. TTS discussed outpatient services to include therapy however, patient denies at this time.    Chief Complaint:  Chief Complaint  Patient presents with   Abdominal Pain   Visit Diagnosis: Passive suicidal statements     CCA Screening, Triage and Referral (STR)  Patient Reported Information How did you hear about [place]? No data recorded What Is the Reason for Your Visit/Call Today? Pt arrived via [facility] c/o on-going epigastric abdominal pain. Pt had endoscopy in beginning of this month and was told she had inflamed ulcers. Pain is constant and Pt has been having persistent emesis and diarrhea. ED RN reports patient made  statements such as not wanting to live anymore. On admission, patient denies suicidal ideations. Stated "I don't want to harm myself or die, I just meant that anyone with this amount of pain would not want to keep living with it."  How Long Has This Been Causing You Problems? 1 wk - 1 month  What Do You Feel Would Help You  the Most Today? Treatment for Depression or other mood problem   Have You Recently Had Any Thoughts About Hurting Yourself? No  Are You Planning to Commit Suicide/Harm Yourself At This time? No   Have you Recently Had Thoughts About Hurting Someone [name]? No  Are You Planning to Harm Someone at This Time? No  Explanation: No data recorded  Have You Used Any Alcohol or Drugs in the Past 24 Hours? No  How Long Ago Did You Use Drugs or Alcohol? No data recorded What Did You Use and How Much? No data recorded  Do You Currently Have a Therapist/Psychiatrist? No  Name of Therapist/Psychiatrist: No data recorded  Have You Been Recently Discharged From Any Office Practice or Programs? No  Explanation of Discharge From Practice/Program: No data recorded    CCA Screening Triage Referral Assessment Type of Contact: Tele-Assessment  Telemedicine Service Delivery: Telemedicine service delivery: This service was provided via telemedicine using a 2-way, interactive audio and video technology  Is this Initial or Reassessment? Initial Assessment  Date Telepsych consult ordered in [facility]:  03/11/21  Time Telepsych consult ordered in [facility]:  1044  Location of Assessment: [facility]  Provider Location: [facility] Assessment Services   Collateral Involvement: none   Does Patient Have a Automotive engineer Guardian? No data recorded Name and Contact of Legal Guardian: No data recorded If Minor and Not Living with Parent(s), Who has Custody? No data recorded Is CPS involved or ever been involved? No data recorded Is APS involved or ever been involved? No data recorded  Patient Determined To Be At Risk for Harm To Self or Others Based on Review of Patient Reported Information or Presenting Complaint? No  Method: No data recorded Availability of Means: No data recorded Intent: No data recorded Notification Required: No data recorded Additional Information for Danger to Others Potential: No data  recorded Additional Comments for Danger to Others Potential: No data recorded Are There Guns or Other Weapons in Your Home? No data recorded Types of Guns/Weapons: No data recorded Are These Weapons Safely Secured?                            No data recorded Who Could Verify You Are Able To Have These Secured: No data recorded Do You Have any Outstanding Charges, Pending Court Dates, Parole/Probation? No data recorded Contacted To Inform of Risk of Harm To Self or Others: No data recorded   Does Patient Present under Involuntary Commitment? No  IVC Papers Initial File Date: No data recorded  [place] of Residence: [place]   Patient Currently Receiving the Following Services: No data recorded  Determination of Need: Routine (7 days)   Options For Referral: Medication Management; Outpatient Therapy     CCA Biopsychosocial Patient Reported Schizophrenia/Schizoaffective Diagnosis in Past: No   Strengths: family oriented   Mental Health Symptoms Depression:   None   Duration of Depressive symptoms:    Mania:   None   Anxiety:    None   Psychosis:   None   Duration of Psychotic symptoms:  Trauma:   None   Obsessions:   None   Compulsions:   None   Inattention:   None   Hyperactivity/Impulsivity:   None   Oppositional/Defiant Behaviors:   None   Emotional Irregularity:   None   Other Mood/Personality Symptoms:  No data recorded   Mental Status Exam Appearance and self-care  Stature:   Average   Weight:   Average weight   Clothing:   Age-appropriate   Grooming:   Normal   Cosmetic use:   None   Posture/gait:   Normal   Motor activity:   Not Remarkable   Sensorium  Attention:   Normal   Concentration:   Normal   Orientation:   X5   Recall/memory:   Normal   Affect and Mood  Affect:   Appropriate   Mood:   Euthymic   Relating  Eye contact:   Normal   Facial expression:   Responsive   Attitude toward  examiner:   Cooperative   Thought and Language  Speech flow:  Clear and Coherent   Thought content:   Appropriate to Mood and Circumstances   Preoccupation:   None   Hallucinations:   None   Organization:  No data recorded  [employer] of Knowledge:   Good   Intelligence:   Average   Abstraction:   Normal   Judgement:   Good   Reality Testing:   Adequate   Insight:   Good   Decision Making:   Normal   Social Functioning  Social Maturity:   Responsible   Social Judgement:   Normal   Stress  Stressors:   Illness; Other (Comment) (taking care of other family members)   Coping Ability:   Normal   Skill Deficits:  No data recorded  Supports:   Family     Religion:    Leisure/Recreation:    Exercise/Diet:     CCA Employment/Education Employment/Work Situation: Employment / Work Situation Employment Situation: Unemployed Patient's Job has Been Impacted by Current Illness: No  Education: Education Is Patient Currently Attending School?: No   CCA Family/Childhood History Family and Relationship History: Family history Marital status: Married What types of issues is patient dealing with in the relationship?: none Additional relationship information: husband had a stroke Does patient have children?: Yes How many children?: 1 How is patient's relationship with their children?: 31 year old live in son  Childhood History:     Child/Adolescent Assessment:     CCA Substance Use Alcohol/Drug Use: Alcohol / Drug Use Pain Medications: See MAR Prescriptions: See MAR Over the Counter: See MAR History of alcohol / drug use?: No history of alcohol / drug abuse                         ASAM's:  Six Dimensions of Multidimensional Assessment  Dimension 1:  Acute Intoxication and/or Withdrawal Potential:      Dimension 2:  Biomedical Conditions and Complications:      Dimension 3:  Emotional, Behavioral, or  Cognitive Conditions and Complications:     Dimension 4:  Readiness to Change:     Dimension 5:  Relapse, Continued use, or Continued Problem Potential:     Dimension 6:  Recovery/Living Environment:     ASAM Severity Score:    ASAM Recommended Level of Treatment:     Substance use Disorder (SUD)    Recommendations for Services/Supports/Treatments:    Discharge Disposition:  DSM5 Diagnoses: Patient Active Problem List   Diagnosis Date Noted   Abdominal pain 03/11/2021   Vomiting 03/11/2021   Diarrhea 03/11/2021   Essential hypertension 03/11/2021   Gastritis 03/11/2021   Abdominal pain, epigastric 02/19/2021   Dyspepsia 08/17/2019   Numbness of right foot 08/23/2018   Right leg weakness 08/23/2018     Referrals to Alternative Service(s): Referred to Alternative Service(s):   Place:   Date:   Time:    Referred to Alternative Service(s):   Place:   Date:   Time:    Referred to Alternative Service(s):   Place:   Date:   Time:    Referred to Alternative Service(s):   Place:   Date:   Time:     Audree Camel, [facility]

## 2021-03-12 NOTE — BH Assessment (Signed)
Disposition: Per Dorena Bodo, NP, patient is psychiatrically cleared.

## 2021-03-12 NOTE — Progress Notes (Signed)
Initial Nutrition Assessment  DOCUMENTATION CODES:   Underweight  INTERVENTION:   -Boost Breeze po TID, each supplement provides 250 kcal and 9 grams of protein  -30 ml Prosource Plus TID, each supplement provides 100 kcals and 15 grams protein -MVI with minerals daily -RD will follow for diet advancement and adjust supplement regimen as appropriate  NUTRITION DIAGNOSIS:   Inadequate oral intake related to altered GI function, poor appetite as evidenced by meal completion < 25%, per patient/family report, percent weight loss.  GOAL:   Patient will meet greater than or equal to 90% of their needs  MONITOR:   PO intake, Supplement acceptance, Diet advancement, Labs, Weight trends, Skin, I & O's  REASON FOR ASSESSMENT:   Malnutrition Screening Tool    ASSESSMENT:   Marie Diaz is a 55 y.o. female with medical history significant for hypertension, gastritis who presents to the emergency department due to about 2 months of abdominal pain, vomiting and diarrhea.  Abdominal pain in the upper abdominal quadrants was described as sharp and burning in nature.  Pain was rated as being severe, she also complained of lower abdominal pain which was crampy in nature.  Vomiting was nonbloody and was 2-3 times daily.  She had an EGD with Dr. Marletta Lor (gastroenterologist) on 8/30 which showed gastritis.  Patient has presented to the ED twice within the last month due to same abdominal pain.  Abdominal pain worsened today, so she called her gastroenterologist (Dr. Marletta Lor) and was told to go to the ER for further evaluation.  Patient complained of weakness and she states that she has lost about 12 pounds within last 2 months.  She denies chest pain, shortness of breath, fever, chills, blood in stool or vomiting of blood.  Pt admitted with abdominal pain and dyspepsia.   Reviewed I/O's: +898 ml x 24 hours and +948 ml since admission  Spoke with pt at bedside, who reports decreased oral intake  secondary to abdominal pain over the past two months. She shares that she saw a GI specialist about a month ago and underwent EGD a few weeks ago, however, has had minimal improvement with symptoms. Pt admits that her abdominal pain is pretty much constant, but also has a fear of eating, due to anticipation that this will make symptoms worse.   For the past 1-2 months, pt has had a very limited diet ("I've been living off of cantaloupe"). Commonly consumed foods include cantaloupe, yogurt, gingerale, and water. Pt also admits to occasionally consuming Ensure supplements that her husband was providing her with his insurance benefits, but she has not drank in the past 2 weeks.   Observed pt's lunch tray (pulled pork, squash, and sweet potatoes). Pt unable to eat and reports she gagged after trying a bite of squash. She has been consuming liquids without difficulty and thinks she would be able to tolerate less "heavy" foods. RD reviewed menu with pt and discussed ordering softer, easy to digest foods with less odors. Per pt, she consumed about 50% of a ham sandwich on the ED last nigh without difficulty. Of note, pt diet was downgraded to clears after RD visit.   Pt endorses significant wt loss. Per her report, her UBW is around 120# and suspects she has lost about 30# over the past 2 months. Reviewed wt hx; pt has experienced a 19.1% wt loss over the past month, which is significant for time frame.  Per pt, she feels weaker and has always been small framed.  Discussed importance of good meal and supplement intake to promote healing.   Medications reviewed and include 0.9% sodium chloride infusion @ 75 ml/hr.   Labs reviewed: K: 5.3. Mg: 2.9.   NUTRITION - FOCUSED PHYSICAL EXAM:  Flowsheet Row Most Recent Value  Orbital Region No depletion  Upper Arm Region Mild depletion  Thoracic and Lumbar Region No depletion  Buccal Region No depletion  Temple Region No depletion  Clavicle Bone Region No  depletion  Clavicle and Acromion Bone Region No depletion  Scapular Bone Region Mild depletion  Dorsal Hand No depletion  Patellar Region Mild depletion  Anterior Thigh Region Mild depletion  Posterior Calf Region Mild depletion  Edema (RD Assessment) None  Hair Reviewed  Eyes Reviewed  Mouth Reviewed  Skin Reviewed  Nails Reviewed       Diet Order:   Diet Order             Diet clear liquid Room service appropriate? Yes; Fluid consistency: Thin  Diet effective now                   EDUCATION NEEDS:   Education needs have been addressed  Skin:  Skin Assessment: Reviewed RN Assessment  Last BM:  Unknown  Height:   Ht Readings from Last 1 Encounters:  03/11/21 5\' 5"  (1.651 m)    Weight:   Wt Readings from Last 1 Encounters:  03/10/21 44 kg    Ideal Body Weight:  56.8 kg  BMI:  Body mass index is 16.14 kg/m.  Estimated Nutritional Needs:   Kcal:  1550-1750  Protein:  85-100 grams  Fluid:  > 1.5 L    05/10/21, RD, LDN, CDCES Registered Dietitian II Certified Diabetes Care and Education Specialist Please refer to [facility] for RD and/or RD on-call/weekend/after hours pager

## 2021-03-12 NOTE — Progress Notes (Addendum)
Subjective: Feeling much better today. Denies abdominal pain, nausea, or vomiting.  She has not had anything to eat yet today.  Reports hospitalist is going to let her try some regular food and if she can tolerate this, she is going to go home.  She is very happy with her clinical improvement.  Objective: Vital signs in last 24 hours: Temp:  [97.5 F (36.4 C)-98.5 F (36.9 C)] 97.5 F (36.4 C) (09/13 0543) Pulse Rate:  [56-89] 58 (09/13 0914) Resp:  [16-18] 16 (09/13 0543) BP: (113-135)/(69-74) 126/71 (09/13 0914) SpO2:  [97 %-100 %] 97 % (09/13 0914)   General:   Alert and oriented, pleasant Head:  Normocephalic and atraumatic. Eyes:  No icterus, sclera clear. Conjuctiva pink.  Abdomen:  Bowel sounds present, soft, non-distended.  Mild TTP across the upper abdomen, primarily epigastric.  (Per patient, improved).  No HSM or hernias noted. No rebound or guarding. No masses appreciated  Msk:  Symmetrical without gross deformities. Normal posture. Extremities:  Without edema. Neurologic:  Alert and  oriented x4;  grossly normal neurologically. Skin:  Warm and dry, intact without significant lesions.  Psych:  Normal mood and affect.  Intake/Output from previous day: 09/12 0701 - 09/13 0700 In: 897.9 [P.O.:240; I.V.:607.9; IV Piggyback:50] Out: -  Intake/Output this shift: Total I/O In: 171 [I.V.:121; IV Piggyback:50] Out: -   Lab Results: Recent Labs    03/10/21 2200 03/11/21 0608  WBC 12.5* 7.4  HGB 16.2* 14.5  HCT 47.9* 43.4  PLT 299 236   BMET Recent Labs    03/10/21 2200 03/11/21 0457  NA 140 142  K 3.7 5.3*  CL 107 108  CO2 23 23  GLUCOSE 92 99  BUN 10 9  CREATININE 0.90 0.88  CALCIUM 9.3 9.4   LFT Recent Labs    03/10/21 2200 03/11/21 0457  PROT 7.5 6.7  ALBUMIN 4.8 4.1  AST 20 31  ALT 21 17  ALKPHOS 79 72  BILITOT 0.5 1.5*   PT/INR Recent Labs    03/11/21 0457  LABPROT 14.3  INR 1.1    Studies/Results: CT Angio Abd/Pel W and/or Wo  Contrast  Result Date: 03/10/2021 CLINICAL DATA:  Epigastric abdominal pain, vomiting, diarrhea, inflamed ulcers on endoscopy, evaluate for mesenteric ischemia EXAM: CTA ABDOMEN AND PELVIS WITHOUT AND WITH CONTRAST TECHNIQUE: Multidetector CT imaging of the abdomen and pelvis was performed using the standard protocol during bolus administration of intravenous contrast. Multiplanar reconstructed images and MIPs were obtained and reviewed to evaluate the vascular anatomy. CONTRAST:  80mL OMNIPAQUE IOHEXOL 350 MG/ML SOLN COMPARISON:  None. FINDINGS: VASCULAR Aorta: Patent.  Atherosclerotic calcifications. Celiac: Patent. SMA: Patent. Renals: Patent bilaterally. IMA: Patent. 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 Otherwise patent. Proximal Outflow: Patent.  Atherosclerotic calcifications. Veins: Portal vein, SMV, and IMV are patent. IVC and iliac veins are unremarkable. Review of the MIP images confirms the above findings. NON-VASCULAR Lower chest: Lung bases are clear. Hepatobiliary: Liver is within normal limits. Status post cholecystectomy. No intrahepatic or extrahepatic ductal dilatation. Pancreas: Known small pancreatic tail cyst is not well visualized, but may measure at least 6 mm (series 11/image 16), previously 11 mm. Spleen: Within normal limits. Adrenals/Urinary Tract: Adrenal glands are within normal limits. Kidneys are within normal limits.  No hydronephrosis. Bladder is within normal limits. Stomach/Bowel: Stomach is within normal limits. No evidence of bowel obstruction. Normal appendix (series 11/image 51). No bowel wall thickening or inflammatory changes.  Lymphatic: No suspicious abdominopelvic lymphadenopathy. Reproductive: Status post hysterectomy. No adnexal masses. Other: No abdominopelvic ascites. Musculoskeletal: Visualized osseous structures are within normal limits. IMPRESSION: VASCULAR Patent mesenteric vessels. 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 NON-VASCULAR No evidence of bowel obstruction. Normal appendix. No bowel wall thickening or inflammatory changes. 6 mm pancreatic tail lesion, less conspicuous than on the prior, likely benign. Electronically Signed   By: Charline Bills M.D.   On: 03/10/2021 23:31    Assessment: 55 y.o. year old female with history of chronic abdominal pain, weight loss, multiple prior ED presentations for abdominal pain with negative evaluations thus far, undergoing EGD as outpatient for dyspepsia on 02/19/21 with gastritis and pathology with reactive gastropathy and negative H.pylori. CT as outpatient without acute findings. Overall, labs have been unremarkable, including LFTs and lipase. Gallbladder absent.   She presented to the ED on 9/11 due to worsening abdominal pain.  Labs again reassuring.  CTA with patent mesenteric vasculature, no acute findings.  Etiology is unclear. Symptoms seem out of proportion to radiological, and laboratory findings, but weight loss is concerning.  She has known pancreatic tail lesion, but do not expect this to be contributing to severity of her symptoms.  MRI recommended as outpatient for surveillance, last in 55 Query psychosomatic component.  Hospitalist is evaluating for porphyria, but thought to be less likely. Labs in process. Prior consideration for cardiac consult due to also reporting some chest pain, but troponin's negative. Due to patient mentioning not wanting to live any longer due to pain, psych consult was placed.  She has since been cleared for outpatient follow-up.   She was started on dicyclomine 3 times daily before meals and at bedtime as well as low-dose amitriptyline nightly yesterday.  This morning, she reports significant improvement in her abdominal pain.  She has only been drinking liquids and has not had any significant symptoms.  She is planning to try a regular diet today and hoping to  discharge home if she tolerates this well. She very well may continue with some mild symptoms, but hopefully continuing bentyl and amitriptyline will provide ongoing improvement/manageable symptoms. If she continues abdominal pain, she would likely need tertiary care referral.   Plan: OK to try regular diet.  Continue PPI twice daily. Continue dicyclomine 10 mg 3 times daily before meals and at bedtime.  Monitor for constipation. Continue scheduled Zofran. Continue amitriptyline each evening. Follow-up on fecal elastase. Follow-up on porphyria workup (ordered by hospitalist)  Anticipate tertiary care referral as outpatient if recurrent abdominal pain.   LOS: 1 day    03/12/2021, 11:05 AM   Ermalinda Memos, PA-C [facility]

## 2021-03-13 DIAGNOSIS — R1013 Epigastric pain: Principal | ICD-10-CM

## 2021-03-13 MED ORDER — DICYCLOMINE HCL 10 MG PO CAPS: 10.0000 mg | ORAL_CAPSULE | Freq: Three times a day (TID) | ORAL | 0 refills | Status: DC

## 2021-03-13 MED ORDER — ENSURE ENLIVE PO LIQD
237.0000 mL | Freq: Three times a day (TID) | ORAL | Status: DC
  Administered 2021-03-13 (×2): 237 mL via ORAL

## 2021-03-13 MED ORDER — CLONIDINE HCL 0.1 MG PO TABS
0.1000 mg | ORAL_TABLET | Freq: Two times a day (BID) | ORAL | Status: DC
  Administered 2021-03-13: 0.1 mg via ORAL
  Filled 2021-03-13: qty 1

## 2021-03-13 MED ORDER — CLONIDINE HCL 0.2 MG PO TABS: 0.1000 mg | ORAL_TABLET | Freq: Two times a day (BID) | ORAL | Status: DC

## 2021-03-13 MED ORDER — AMITRIPTYLINE HCL 25 MG PO TABS: 25.0000 mg | ORAL_TABLET | Freq: Every day | ORAL | 0 refills | Status: DC

## 2021-03-13 NOTE — Progress Notes (Signed)
Subjective:  Feeling better. Hoping to go home. Tolerating dinner and breakfast without abdominal pain.   Objective: Vital signs in last 24 hours: Temp:  [98 F (36.7 C)-98.2 F (36.8 C)] 98 F (36.7 C) (09/14 0559) Pulse Rate:  [56-70] 59 (09/14 0559) Resp:  [16-20] 18 (09/14 0559) BP: (118-126)/(65-85) 126/75 (09/14 0559) SpO2:  [99 %-100 %] 99 % (09/14 0559)   General:   Alert,  Well-developed, well-nourished, pleasant and cooperative in NAD Head:  Normocephalic and atraumatic. Eyes:  Sclera clear, no icterus. Abdomen:  Soft, mild epig tenderness and nondistended. Normal bowel sounds, without guarding, and without rebound.   Extremities:  Without clubbing, deformity or edema. Neurologic:  Alert and  oriented x4;  grossly normal neurologically. Skin:  Intact without significant lesions or rashes. Psych:  Alert and cooperative. Normal mood and affect.  Intake/Output from previous day: 09/13 0701 - 09/14 0700 In: 861.1 [P.O.:360; I.V.:451.1; IV Piggyback:50] Out: -  Intake/Output this shift: No intake/output data recorded.  Lab Results: CBC Recent Labs    03/10/21 2200 03/11/21 0608  WBC 12.5* 7.4  HGB 16.2* 14.5  HCT 47.9* 43.4  MCV 91.6 92.5  PLT 299 236   BMET Recent Labs    03/10/21 2200 03/11/21 0457  NA 140 142  K 3.7 5.3*  CL 107 108  CO2 23 23  GLUCOSE 92 99  BUN 10 9  CREATININE 0.90 0.88  CALCIUM 9.3 9.4   LFTs Recent Labs    03/10/21 2200 03/11/21 0457  BILITOT 0.5 1.5*  ALKPHOS 79 72  AST 20 31  ALT 21 17  PROT 7.5 6.7  ALBUMIN 4.8 4.1   Recent Labs    03/10/21 2200  LIPASE 38   PT/INR Recent Labs    03/11/21 0457  LABPROT 14.3  INR 1.1      Imaging Studies: CT Abdomen Pelvis W Contrast  Result Date: 02/19/2021 CLINICAL DATA:  Acute abdominal pain. EXAM: CT ABDOMEN AND PELVIS WITH CONTRAST TECHNIQUE: Multidetector CT imaging of the abdomen and pelvis was performed using the standard protocol following bolus administration  of intravenous contrast. CONTRAST:  85mL OMNIPAQUE IOHEXOL 350 MG/ML SOLN COMPARISON:  None FINDINGS: Lower chest: Lung bases are clear. Hepatobiliary: No focal hepatic lesion. Postcholecystectomy. No biliary dilatation. Pancreas: Pancreas is normal. No ductal dilatation. No pancreatic inflammation. Spleen: Normal spleen Adrenals/urinary tract: Adrenal glands and kidneys are normal. The ureters and bladder normal. Stomach/Bowel: Stomach, small-bowel and cecum are normal. The appendix is not identified but there is no pericecal inflammation to suggest appendicitis. The colon and rectosigmoid colon are normal. Multiple diverticula of the descending colon and sigmoid colon without acute inflammation. Vascular/Lymphatic: Abdominal aorta is normal caliber. No periportal or retroperitoneal adenopathy. No pelvic adenopathy. Reproductive: Post hysterectomy.  Adnexa unremarkable Other: No free fluid. Musculoskeletal: No aggressive osseous lesion. IMPRESSION: 1. No acute findings in the abdomen pelvis. 2. No evidence of gastritis by CT imaging. 3. Post cholecystectomy and hysterectomy. 4. Appendix not identified but no secondary signs appendicitis 5. LEFT colon diverticulosis without diverticulitis. Electronically Signed   By: Genevive Bi M.D.   On: 02/19/2021 14:05   DG Chest Portable 1 View  Result Date: 02/28/2021 CLINICAL DATA:  Severe epigastric pain after EGD yesterday EXAM: PORTABLE CHEST 1 VIEW COMPARISON:  Chest radiograph 02/03/2021 FINDINGS: The cardiomediastinal silhouette is normal. The lungs clear, with no focal consolidation or pulmonary edema. There is no pleural effusion or pneumothorax. There is no acute osseous abnormality. There is no free subdiaphragmatic air.  IMPRESSION: No free subdiaphragmatic air. Electronically Signed   By: Lesia Hausen M.D.   On: 02/28/2021 15:49   CT Angio Abd/Pel W and/or Wo Contrast  Result Date: 03/10/2021 CLINICAL DATA:  Epigastric abdominal pain, vomiting,  diarrhea, inflamed ulcers on endoscopy, evaluate for mesenteric ischemia EXAM: CTA ABDOMEN AND PELVIS WITHOUT AND WITH CONTRAST TECHNIQUE: Multidetector CT imaging of the abdomen and pelvis was performed using the standard protocol during bolus administration of intravenous contrast. Multiplanar reconstructed images and MIPs were obtained and reviewed to evaluate the vascular anatomy. CONTRAST:  73mL OMNIPAQUE IOHEXOL 350 MG/ML SOLN COMPARISON:  None. FINDINGS: VASCULAR Aorta: Patent.  Atherosclerotic calcifications. Celiac: Patent. SMA: Patent. Renals: Patent bilaterally. IMA: Patent. 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 Otherwise patent. Proximal Outflow: Patent.  Atherosclerotic calcifications. Veins: Portal vein, SMV, and IMV are patent. IVC and iliac veins are unremarkable. Review of the MIP images confirms the above findings. NON-VASCULAR Lower chest: Lung bases are clear. Hepatobiliary: Liver is within normal limits. Status post cholecystectomy. No intrahepatic or extrahepatic ductal dilatation. Pancreas: Known small pancreatic tail cyst is not well visualized, but may measure at least 6 mm (series 11/image 16), previously 11 mm. Spleen: Within normal limits. Adrenals/Urinary Tract: Adrenal glands are within normal limits. Kidneys are within normal limits.  No hydronephrosis. Bladder is within normal limits. Stomach/Bowel: Stomach is within normal limits. No evidence of bowel obstruction. Normal appendix (series 11/image 51). No bowel wall thickening or inflammatory changes. Lymphatic: No suspicious abdominopelvic lymphadenopathy. Reproductive: Status post hysterectomy. No adnexal masses. Other: No abdominopelvic ascites. Musculoskeletal: Visualized osseous structures are within normal limits. IMPRESSION: VASCULAR Patent mesenteric vessels. 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 NON-VASCULAR No evidence of  bowel obstruction. Normal appendix. No bowel wall thickening or inflammatory changes. 6 mm pancreatic tail lesion, less conspicuous than on the prior, likely benign. Electronically Signed   By: Charline Bills M.D.   On: 03/10/2021 23:31  [2 weeks]   Assessment:  Pleasant 55 year old female with history of chronic abdominal pain, weight loss, multiple prior ED presentations for abdominal pain with negative evaluations thus far, undergoing EGD as an outpatient for dyspepsia February 19, 2021 with gastritis and pathology with reactive gastropathy negative H. pylori.  CT as outpatient without acute findings.  Overall, labs have been unrevealing.  Gallbladder absent.    Presented to the ED with worsening abdominal pain.  Labs again reassuring.  CTA with patent mesenteric vasculature, no acute findings.  Etiology of abdominal pain remains unclear.  Symptoms seem to be out of proportion to objective findings.  Weight loss is concerning.  Known pancreatic tail lesion, not likely contributing to severity of her symptoms.  She was overdue for MRI as an outpatient for surveillance, with current inpatient CT showeing less conspicuous lesion.  Cannot rule out exacerbations of symptoms due to psychosomatic component.  Has been evaluated by psychiatry this admission.  Plans for outpatient follow-up.    This admission started on dicyclomine and low-dose amitriptyline.  Today she believes her pain continues to improve.  She tolerated dinner and breakfast without any significant abdominal pain.  Has some vague burning but overall improved.      Plan: PPI twice daily Dicyclomine 10 mg 3 times daily before meals and at bedtime, monitor for constipation. Continue low-dose amitriptyline each evening. Follow-up pending porphyria work-up as ordered by hospitalist. Still has not been collected for pancreatic elastase. Continue soft diet. Close outpatient  follow-up. Will follow peripherally.  Leanna Battles. Dixon Boos [facility] [phone] 9/14/202210:47 AM    LOS: 2 days

## 2021-03-13 NOTE — Progress Notes (Signed)
Nutrition Follow-up  DOCUMENTATION CODES:   Underweight  INTERVENTION:   -D/c Boost Breeze -D/c Prosource -Continue MVI with minerals daily -Ensure Enlive po TID, each supplement provides 350 kcal and 20 grams of protein  -Magic cup TID with meals, each supplement provides 290 kcal and 9 grams of protein  -[place] yogurt   NUTRITION DIAGNOSIS:   Inadequate oral intake related to altered GI function, poor appetite as evidenced by meal completion < 25%, per patient/family report, percent weight loss.  Ongoing  GOAL:   Patient will meet greater than or equal to 90% of their needs  Progressing   MONITOR:   PO intake, Supplement acceptance, Diet advancement, Labs, Weight trends, Skin, I & O's  REASON FOR ASSESSMENT:   Malnutrition Screening Tool    ASSESSMENT:   Marie Diaz is a 55 y.o. female with medical history significant for hypertension, gastritis who presents to the emergency department due to about 2 months of abdominal pain, vomiting and diarrhea.  Abdominal pain in the upper abdominal quadrants was described as sharp and burning in nature.  Pain was rated as being severe, she also complained of lower abdominal pain which was crampy in nature.  Vomiting was nonbloody and was 2-3 times daily.  She had an EGD with Dr. Marletta Lor (gastroenterologist) on 8/30 which showed gastritis.  Patient has presented to the ED twice within the last month due to same abdominal pain.  Abdominal pain worsened today, so she called her gastroenterologist (Dr. Marletta Lor) and was told to go to the ER for further evaluation.  Patient complained of weakness and she states that she has lost about 12 pounds within last 2 months.  She denies chest pain, shortness of breath, fever, chills, blood in stool or vomiting of blood.  Reviewed I/O's: +861 ml x 24 hours and +1.8 L since admission    Per meal completion records, pt consumed 50% of dinner last night. She has been advanced to a soft diet. RD will  add Ensure supplements and meal preferences as discussed now that diet is advanced.   Medications reviewed and include pepcid.   Labs reviewed: K: 5.3.    Diet Order:   Diet Order             DIET SOFT Room service appropriate? Yes; Fluid consistency: Thin  Diet effective now                   EDUCATION NEEDS:   Education needs have been addressed  Skin:  Skin Assessment: Reviewed RN Assessment  Last BM:  Unknown  Height:   Ht Readings from Last 1 Encounters:  03/11/21 5\' 5"  (1.651 m)    Weight:   Wt Readings from Last 1 Encounters:  03/10/21 44 kg    Ideal Body Weight:  56.8 kg  BMI:  Body mass index is 16.14 kg/m.  Estimated Nutritional Needs:   Kcal:  1550-1750  Protein:  85-100 grams  Fluid:  > 1.5 L    05/10/21, RD, LDN, CDCES Registered Dietitian II Certified Diabetes Care and Education Specialist Please refer to [facility] for RD and/or RD on-call/weekend/after hours pager

## 2021-03-13 NOTE — Progress Notes (Signed)
PROGRESS NOTE    Marie Diaz  IHK:[id number] DOB: 02/02/1966 DOA: 03/10/2021 PCP: Rebekah Chesterfield, NP    Brief Narrative:  55 year old female with history of hypertension but no other medical issues, status postcholecystectomy, longstanding history of pancreatic tail tumor under surveillance, recently diagnosed gastritis with EGD on 8/30 presented to the emergency room with ongoing severe, intermittent abdominal pain mostly in the epigastrium.  She has been seen multiple times in the ER and as outpatient in GI office.  In the emergency room work-up was mostly normal.  Lipase normal.  Admitted for symptomatic treatment.   Assessment & Plan:  Generalized abdominal pain:  -Work-up unrevealing thus far -CT scan with contrast 8/23 essentially normal. -CT angiogram chest abdomen pelvis 9/11 essentially normal.  Widely patent mesenteric artery. -EGD 8/30 with gastritis. -Pancreatic tail 6 mm lesion stable for last 6 years. -Gastroenterology following, patient tells me she has not had a colonoscopy, would benefit from one in the near future -Started on Bentyl and amitriptyline -Improving with supportive care -Porphyria work-up pending, clinical suspicion is low -Possibly home later today if tolerates lunch   Hypertension: Blood pressure fairly stable.  On amlodipine and clonidine. -decrease clonidine dose  Anxiety: Patient is on Xanax at home that is continued.  Amitriptyline was added at night and that is helpful. She expressed concerns about too much pain to be alive, however there was no suicidal or homicidal ideation.  Seen by psychiatry and cleared.   DVT prophylaxis: SCDs Start: 03/11/21 0144   Code Status: Full code Family Communication: None Disposition Plan: Status is: Inpatient  Remains inpatient appropriate because:IV treatments appropriate due to intensity of illness or inability to take PO  Dispo: The patient is from: Home              Anticipated d/c is to:  Home              Patient currently is not medically stable to d/c.   Difficult to place patient No         Consultants:  Gastroenterology  Procedures:  None  Antimicrobials:  None   Subjective: -Feels better today, ate half a grilled cheese sandwich last night, no nausea or vomiting, pain is less intense  Objective: Vitals:   03/12/21 0914 03/12/21 1451 03/12/21 2251 03/13/21 0559  BP: 126/71 118/65 124/85 126/75  Pulse: (!) 58 (!) 56 70 (!) 59  Resp:  16 20 18   Temp:  98.2 F (36.8 C) 98.2 F (36.8 C) 98 F (36.7 C)  TempSrc:  Oral Oral Oral  SpO2: 97% 99% 100% 99%  Weight:      Height:        Intake/Output Summary (Last 24 hours) at 03/13/2021 1048 Last data filed at 03/12/2021 1700 Gross per 24 hour  Intake 570.04 ml  Output --  Net 570.04 ml   Filed Weights   03/10/21 1923  Weight: 44 kg    Examination:  General exam: Pleasant female laying in bed, AAOx3, no distress HEENT: No JVD CVS: S1-S2, regular rate rhythm Lungs: Clear bilaterally Abdomen: Soft, nontender, bowel sounds present Extremities: No edema Skin: No rash on exposed skin   Data Reviewed: I have personally reviewed following labs and imaging studies  CBC: Recent Labs  Lab 03/10/21 2200 03/11/21 0608  WBC 12.5* 7.4  HGB 16.2* 14.5  HCT 47.9* 43.4  MCV 91.6 92.5  PLT 299 236   Basic Metabolic Panel: Recent Labs  Lab 03/10/21 2200 03/11/21 0457  NA 140 142  K 3.7 5.3*  CL 107 108  CO2 23 23  GLUCOSE 92 99  BUN 10 9  CREATININE 0.90 0.88  CALCIUM 9.3 9.4  MG  --  2.9*  PHOS  --  4.4   GFR: Estimated Creatinine Clearance: 49.6 mL/min (by C-G formula based on SCr of 0.88 mg/dL). Liver Function Tests: Recent Labs  Lab 03/10/21 2200 03/11/21 0457  AST 20 31  ALT 21 17  ALKPHOS 79 72  BILITOT 0.5 1.5*  PROT 7.5 6.7  ALBUMIN 4.8 4.1   Recent Labs  Lab 03/10/21 2200  LIPASE 38   No results for input(s): AMMONIA in the last 168 hours. Coagulation  Profile: Recent Labs  Lab 03/11/21 0457  INR 1.1   Cardiac Enzymes: No results for input(s): CKTOTAL, CKMB, CKMBINDEX, TROPONINI in the last 168 hours. BNP (last 3 results) No results for input(s): PROBNP in the last 8760 hours. HbA1C: No results for input(s): HGBA1C in the last 72 hours. CBG: No results for input(s): GLUCAP in the last 168 hours. Lipid Profile: No results for input(s): CHOL, HDL, LDLCALC, TRIG, CHOLHDL, LDLDIRECT in the last 72 hours. Thyroid Function Tests: No results for input(s): TSH, T4TOTAL, FREET4, T3FREE, THYROIDAB in the last 72 hours. Anemia Panel: No results for input(s): VITAMINB12, FOLATE, FERRITIN, TIBC, IRON, RETICCTPCT in the last 72 hours. Sepsis Labs: No results for input(s): PROCALCITON, LATICACIDVEN in the last 168 hours.  Recent Results (from the past 240 hour(s))  Resp Panel by RT-PCR (Flu A&B, Covid) Nasopharyngeal Swab     Status: None   Collection Time: 03/10/21 11:52 PM   Specimen: Nasopharyngeal Swab; Nasopharyngeal(NP) swabs in vial transport medium  Result Value Ref Range Status   SARS Coronavirus 2 by RT PCR NEGATIVE NEGATIVE Final    Comment: (NOTE) SARS-CoV-2 target nucleic acids are NOT DETECTED.  The SARS-CoV-2 RNA is generally detectable in upper respiratory specimens during the acute phase of infection. The lowest concentration of SARS-CoV-2 viral copies this assay can detect is 138 copies/mL. A negative result does not preclude SARS-Cov-2 infection and should not be used as the sole basis for treatment or other patient management decisions. A negative result may occur with  improper specimen collection/handling, submission of specimen other than nasopharyngeal swab, presence of viral mutation(s) within the areas targeted by this assay, and inadequate number of viral copies(<138 copies/mL). A negative result must be combined with clinical observations, patient history, and epidemiological information. The expected result  is Negative.  Fact Sheet for Patients:  BloggerCourse.com  Fact Sheet for Healthcare Providers:  SeriousBroker.it  This test is no t yet approved or cleared by the [place] FDA and  has been authorized for detection and/or diagnosis of SARS-CoV-2 by FDA under an Emergency Use Authorization (EUA). This EUA will remain  in effect (meaning this test can be used) for the duration of the COVID-19 declaration under Section 564(b)(1) of the Act, 21 U.S.C.section 360bbb-3(b)(1), unless the authorization is terminated  or revoked sooner.       Influenza A by PCR NEGATIVE NEGATIVE Final   Influenza B by PCR NEGATIVE NEGATIVE Final    Comment: (NOTE) The Xpert Xpress SARS-CoV-2/FLU/RSV plus assay is intended as an aid in the diagnosis of influenza from Nasopharyngeal swab specimens and should not be used as a sole basis for treatment. Nasal washings and aspirates are unacceptable for Xpert Xpress SARS-CoV-2/FLU/RSV testing.  Fact Sheet for Patients: BloggerCourse.com  Fact Sheet for Healthcare Providers: SeriousBroker.it  This test  is not yet approved or cleared by the [place] and has been authorized for detection and/or diagnosis of SARS-CoV-2 by FDA under an Emergency Use Authorization (EUA). This EUA will remain in effect (meaning this test can be used) for the duration of the COVID-19 declaration under Section 564(b)(1) of the Act, 21 U.S.C. section 360bbb-3(b)(1), unless the authorization is terminated or revoked.  Performed at [facility], [address] [place], [place] [address]      Scheduled Meds:  amitriptyline  25 mg Oral QHS   amLODipine  10 mg Oral Daily   cloNIDine  0.1 mg Oral BID   dicyclomine  10 mg Oral TID AC & HS   feeding supplement  237 mL Oral TID BM   multivitamin with minerals  1 tablet Oral Daily   ondansetron (ZOFRAN) IV  4 mg  Intravenous Q6H   pantoprazole (PROTONIX) IV  40 mg Intravenous Q12H   Continuous Infusions:  famotidine (PEPCID) IV 20 mg (03/13/21 0555)   promethazine (PHENERGAN) injection (IM or IVPB) Stopped (03/11/21 1243)     LOS: 2 days    Time spent: 30 minutes     Zannie Cove, MD [facility]

## 2021-03-14 LAB — PORPHOBILINOGEN, RANDOM URINE

## 2021-03-15 NOTE — Discharge Summary (Signed)
Physician Discharge Summary  Marie Diaz ERX:[id number] DOB: 01-17-1966 DOA: 03/10/2021  PCP: Rebekah Chesterfield, NP  Admit date: 03/10/2021 Discharge date: 03/13/2021  Time spent:  Recommendations for Outpatient Follow-up:  PCP in 1 week, please review all imaging studies from this admission Gastroenterology follow-up, needs a colonoscopy 6 mm pancreatic tail lesion needs surveillance   Discharge Diagnoses:  Principal Problem:   Abdominal pain Active Problems:   Dyspepsia   Vomiting   Diarrhea   Essential hypertension   Gastritis   Discharge Condition: Stable  Diet recommendation: Low-sodium  Filed Weights   03/10/21 1923  Weight: 44 kg    History of present illness:  55 year old female with history of hypertension but no other medical issues, status postcholecystectomy, longstanding history of pancreatic tail tumor under surveillance, recently diagnosed gastritis with EGD on 8/30 presented to the emergency room with ongoing severe, intermittent abdominal pain mostly in the epigastrium.  She has been seen multiple times in the ER and as outpatient in GI office.  In the emergency room work-up was mostly normal.  Lipase normal.  Admitted for symptomatic treatment.  Hospital Course:   Generalized abdominal pain:  -Work-up unrevealing thus far -CT scan with contrast 8/23 essentially normal. -CT angiogram chest abdomen pelvis 9/11 essentially normal.  Widely patent mesenteric artery. -EGD 8/30 with gastritis. -Pancreatic tail 6 mm lesion stable for last 6 years. -Gastroenterology consulted, patient tells me she has not had a colonoscopy, would benefit from one in the near future -Started on Bentyl and amitriptyline -Improved with supportive care and above antispasmodics -Porphyria work-up pending, clinical suspicion is low -Discharged home in a stable condition, advised gastroenterology follow-up for colonoscopy   Hypertension: Blood pressure fairly stable.   On amlodipine and clonidine. -decreased clonidine dose   Anxiety: Patient is on Xanax at home that is continued.  Amitriptyline was added at night and that is helpful. She expressed concerns about too much pain to be alive, however there was no suicidal or homicidal ideation.  Seen by psychiatry and cleared.   Consultations: Gastroenterology  Discharge Exam: Vitals:   03/13/21 0559 03/13/21 1320  BP: 126/75 133/77  Pulse: (!) 59 70  Resp: 18 20  Temp: 98 F (36.7 C) 98.5 F (36.9 C)  SpO2: 99% 99%    General: AAOx3 Cardiovascular: S1-S2, regular rate rhythm Respiratory: Clear  Discharge Instructions   Discharge Instructions     Diet - low sodium heart healthy   Complete by: As directed    Increase activity slowly   Complete by: As directed       Allergies as of 03/13/2021       Reactions   Tetracyclines & Related Other (See Comments)   Unknown reaction (patient does not recall)   Ultram [tramadol] Other (See Comments)   "knocks me out"        Medication List     STOP taking these medications    famotidine 20 MG tablet Commonly known as: PEPCID       TAKE these medications    ALPRAZolam 1 MG tablet Commonly known as: XANAX Take 1 mg by mouth at bedtime.   amitriptyline 25 MG tablet Commonly known as: ELAVIL Take 1 tablet (25 mg total) by mouth at bedtime.   amLODipine 10 MG tablet Commonly known as: NORVASC Take 10 mg by mouth daily.   cloNIDine 0.2 MG tablet Commonly known as: CATAPRES Take 0.5 tablets (0.1 mg total) by mouth 2 (two) times daily. What changed: how much  to take   dicyclomine 10 MG capsule Commonly known as: BENTYL Take 1 capsule (10 mg total) by mouth 3 (three) times daily before meals.   esomeprazole 20 MG capsule Commonly known as: NEXIUM Take 1 capsule (20 mg total) by mouth 2 (two) times daily before a meal.   lisinopril 20 MG tablet Commonly known as: ZESTRIL Take 20 mg by mouth daily.       Allergies   Allergen Reactions   Tetracyclines & Related Other (See Comments)    Unknown reaction (patient does not recall)   Ultram [Tramadol] Other (See Comments)    "knocks me out"      The results of significant diagnostics from this hospitalization (including imaging, microbiology, ancillary and laboratory) are listed below for reference.    Significant Diagnostic Studies: CT Abdomen Pelvis W Contrast  Result Date: 02/19/2021 CLINICAL DATA:  Acute abdominal pain. EXAM: CT ABDOMEN AND PELVIS WITH CONTRAST TECHNIQUE: Multidetector CT imaging of the abdomen and pelvis was performed using the standard protocol following bolus administration of intravenous contrast. CONTRAST:  14mL OMNIPAQUE IOHEXOL 350 MG/ML SOLN COMPARISON:  None FINDINGS: Lower chest: Lung bases are clear. Hepatobiliary: No focal hepatic lesion. Postcholecystectomy. No biliary dilatation. Pancreas: Pancreas is normal. No ductal dilatation. No pancreatic inflammation. Spleen: Normal spleen Adrenals/urinary tract: Adrenal glands and kidneys are normal. The ureters and bladder normal. Stomach/Bowel: Stomach, small-bowel and cecum are normal. The appendix is not identified but there is no pericecal inflammation to suggest appendicitis. The colon and rectosigmoid colon are normal. Multiple diverticula of the descending colon and sigmoid colon without acute inflammation. Vascular/Lymphatic: Abdominal aorta is normal caliber. No periportal or retroperitoneal adenopathy. No pelvic adenopathy. Reproductive: Post hysterectomy.  Adnexa unremarkable Other: No free fluid. Musculoskeletal: No aggressive osseous lesion. IMPRESSION: 1. No acute findings in the abdomen pelvis. 2. No evidence of gastritis by CT imaging. 3. Post cholecystectomy and hysterectomy. 4. Appendix not identified but no secondary signs appendicitis 5. LEFT colon diverticulosis without diverticulitis. Electronically Signed   By: Genevive Bi M.D.   On: 02/19/2021 14:05   DG Chest  Portable 1 View  Result Date: 02/28/2021 CLINICAL DATA:  Severe epigastric pain after EGD yesterday EXAM: PORTABLE CHEST 1 VIEW COMPARISON:  Chest radiograph 02/03/2021 FINDINGS: The cardiomediastinal silhouette is normal. The lungs clear, with no focal consolidation or pulmonary edema. There is no pleural effusion or pneumothorax. There is no acute osseous abnormality. There is no free subdiaphragmatic air. IMPRESSION: No free subdiaphragmatic air. Electronically Signed   By: Lesia Hausen M.D.   On: 02/28/2021 15:49   CT Angio Abd/Pel W and/or Wo Contrast  Result Date: 03/10/2021 CLINICAL DATA:  Epigastric abdominal pain, vomiting, diarrhea, inflamed ulcers on endoscopy, evaluate for mesenteric ischemia EXAM: CTA ABDOMEN AND PELVIS WITHOUT AND WITH CONTRAST TECHNIQUE: Multidetector CT imaging of the abdomen and pelvis was performed using the standard protocol during bolus administration of intravenous contrast. Multiplanar reconstructed images and MIPs were obtained and reviewed to evaluate the vascular anatomy. CONTRAST:  31mL OMNIPAQUE IOHEXOL 350 MG/ML SOLN COMPARISON:  None. FINDINGS: VASCULAR Aorta: Patent.  Atherosclerotic calcifications. Celiac: Patent. SMA: Patent. Renals: Patent bilaterally. IMA: Patent. 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 Otherwise patent. Proximal Outflow: Patent.  Atherosclerotic calcifications. Veins: Portal vein, SMV, and IMV are patent. IVC and iliac veins are unremarkable. Review of the MIP images confirms the above findings. NON-VASCULAR Lower chest: Lung bases are clear. Hepatobiliary: Liver is within normal limits. Status post  cholecystectomy. No intrahepatic or extrahepatic ductal dilatation. Pancreas: Known small pancreatic tail cyst is not well visualized, but may measure at least 6 mm (series 11/image 16), previously 11 mm. Spleen: Within normal limits. Adrenals/Urinary Tract: Adrenal glands are within normal  limits. Kidneys are within normal limits.  No hydronephrosis. Bladder is within normal limits. Stomach/Bowel: Stomach is within normal limits. No evidence of bowel obstruction. Normal appendix (series 11/image 51). No bowel wall thickening or inflammatory changes. Lymphatic: No suspicious abdominopelvic lymphadenopathy. Reproductive: Status post hysterectomy. No adnexal masses. Other: No abdominopelvic ascites. Musculoskeletal: Visualized osseous structures are within normal limits. IMPRESSION: VASCULAR Patent mesenteric vessels. 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 NON-VASCULAR No evidence of bowel obstruction. Normal appendix. No bowel wall thickening or inflammatory changes. 6 mm pancreatic tail lesion, less conspicuous than on the prior, likely benign. Electronically Signed   By: Charline Bills M.D.   On: 03/10/2021 23:31    Microbiology: Recent Results (from the past 240 hour(s))  Resp Panel by RT-PCR (Flu A&B, Covid) Nasopharyngeal Swab     Status: None   Collection Time: 03/10/21 11:52 PM   Specimen: Nasopharyngeal Swab; Nasopharyngeal(NP) swabs in vial transport medium  Result Value Ref Range Status   SARS Coronavirus 2 by RT PCR NEGATIVE NEGATIVE Final    Comment: (NOTE) SARS-CoV-2 target nucleic acids are NOT DETECTED.  The SARS-CoV-2 RNA is generally detectable in upper respiratory specimens during the acute phase of infection. The lowest concentration of SARS-CoV-2 viral copies this assay can detect is 138 copies/mL. A negative result does not preclude SARS-Cov-2 infection and should not be used as the sole basis for treatment or other patient management decisions. A negative result may occur with  improper specimen collection/handling, submission of specimen other than nasopharyngeal swab, presence of viral mutation(s) within the areas targeted by this assay, and inadequate number of viral copies(<138 copies/mL). A negative result must  be combined with clinical observations, patient history, and epidemiological information. The expected result is Negative.  Fact Sheet for Patients:  BloggerCourse.com  Fact Sheet for Healthcare Providers:  SeriousBroker.it  This test is no t yet approved or cleared by the [place] FDA and  has been authorized for detection and/or diagnosis of SARS-CoV-2 by FDA under an Emergency Use Authorization (EUA). This EUA will remain  in effect (meaning this test can be used) for the duration of the COVID-19 declaration under Section 564(b)(1) of the Act, 21 U.S.C.section 360bbb-3(b)(1), unless the authorization is terminated  or revoked sooner.       Influenza A by PCR NEGATIVE NEGATIVE Final   Influenza B by PCR NEGATIVE NEGATIVE Final    Comment: (NOTE) The Xpert Xpress SARS-CoV-2/FLU/RSV plus assay is intended as an aid in the diagnosis of influenza from Nasopharyngeal swab specimens and should not be used as a sole basis for treatment. Nasal washings and aspirates are unacceptable for Xpert Xpress SARS-CoV-2/FLU/RSV testing.  Fact Sheet for Patients: BloggerCourse.com  Fact Sheet for Healthcare Providers: SeriousBroker.it  This test is not yet approved or cleared by the [place] FDA and has been authorized for detection and/or diagnosis of SARS-CoV-2 by FDA under an Emergency Use Authorization (EUA). This EUA will remain in effect (meaning this test can be used) for the duration of the COVID-19 declaration under Section 564(b)(1) of the Act, 21 U.S.C. section 360bbb-3(b)(1), unless the authorization is terminated or revoked.  Performed at [facility], [address] [place], [place] [address]  Labs: Basic Metabolic Panel: Recent Labs  Lab 03/10/21 2200 03/11/21 0457  NA 140 142  K 3.7 5.3*  CL 107 108  CO2 23 23  GLUCOSE 92 99  BUN 10 9  CREATININE  0.90 0.88  CALCIUM 9.3 9.4  MG  --  2.9*  PHOS  --  4.4   Liver Function Tests: Recent Labs  Lab 03/10/21 2200 03/11/21 0457  AST 20 31  ALT 21 17  ALKPHOS 79 72  BILITOT 0.5 1.5*  PROT 7.5 6.7  ALBUMIN 4.8 4.1   Recent Labs  Lab 03/10/21 2200  LIPASE 38   No results for input(s): AMMONIA in the last 168 hours. CBC: Recent Labs  Lab 03/10/21 2200 03/11/21 0608  WBC 12.5* 7.4  HGB 16.2* 14.5  HCT 47.9* 43.4  MCV 91.6 92.5  PLT 299 236   Cardiac Enzymes: No results for input(s): CKTOTAL, CKMB, CKMBINDEX, TROPONINI in the last 168 hours. BNP: BNP (last 3 results) No results for input(s): BNP in the last 8760 hours.  ProBNP (last 3 results) No results for input(s): PROBNP in the last 8760 hours.  CBG: No results for input(s): GLUCAP in the last 168 hours.     Signed:  Zannie Cove MD.  [facility] 03/15/2021, 2:31 PM

## 2021-03-28 ENCOUNTER — Telehealth: Payer: Self-pay | Admitting: Gastroenterology

## 2021-03-28 NOTE — Telephone Encounter (Signed)
Needs hospital follow up for abd pain, pancreatic tail cyst, ?colonoscoy.

## 2021-07-16 ENCOUNTER — Encounter: Payer: Self-pay | Admitting: Internal Medicine

## 2021-07-16 ENCOUNTER — Ambulatory Visit: Payer: Self-pay | Admitting: Gastroenterology

## 2021-11-20 ENCOUNTER — Emergency Department (HOSPITAL_COMMUNITY): Admission: EM | Admit: 2021-11-20 | Discharge: 2021-11-20 | Discharge status: Against Medical Advice | Payer: Self-pay

## 2021-11-20 NOTE — ED Triage Notes (Signed)
Called x1

## 2021-12-10 ENCOUNTER — Telehealth: Payer: Self-pay

## 2021-12-10 NOTE — Telephone Encounter (Signed)
Pt wanted a appt to be seen. Call was transferred to the front staff to schedule

## 2021-12-10 NOTE — Telephone Encounter (Signed)
Pt called and lmom requesting a return call. Please call the pt back at [phone].

## 2021-12-16 ENCOUNTER — Emergency Department (HOSPITAL_COMMUNITY)
Admission: EM | Admit: 2021-12-16 | Discharge: 2021-12-16 | Disposition: A | Discharge status: Home / Self Care | Payer: Self-pay

## 2021-12-16 NOTE — ED Notes (Signed)
Not in waiting area x 3  

## 2021-12-20 ENCOUNTER — Encounter (HOSPITAL_COMMUNITY): Payer: Self-pay

## 2021-12-20 ENCOUNTER — Emergency Department (HOSPITAL_COMMUNITY)
Admission: EM | Admit: 2021-12-20 | Discharge: 2021-12-21 | Disposition: A | Discharge status: Home / Self Care | Payer: Medicaid Other | Attending: Emergency Medicine | Admitting: Emergency Medicine

## 2021-12-20 DIAGNOSIS — R1084 Generalized abdominal pain: Secondary | ICD-10-CM | POA: Insufficient documentation

## 2021-12-21 ENCOUNTER — Emergency Department (HOSPITAL_COMMUNITY): Payer: Medicaid Other

## 2021-12-21 LAB — CBC WITH DIFFERENTIAL/PLATELET
Abs Immature Granulocytes: 0.02 10*3/uL (ref 0.00–0.07)
Basophils Absolute: 0.1 10*3/uL (ref 0.0–0.1)
Basophils Relative: 1 %
Eosinophils Absolute: 0.1 10*3/uL (ref 0.0–0.5)
Eosinophils Relative: 1 %
HCT: 44 % (ref 36.0–46.0)
Hemoglobin: 15.3 g/dL — ABNORMAL HIGH (ref 12.0–15.0)
Immature Granulocytes: 0 %
Lymphocytes Relative: 37 %
Lymphs Abs: 3.2 10*3/uL (ref 0.7–4.0)
MCH: 30.8 pg (ref 26.0–34.0)
MCHC: 34.8 g/dL (ref 30.0–36.0)
MCV: 88.7 fL (ref 80.0–100.0)
Monocytes Absolute: 0.7 10*3/uL (ref 0.1–1.0)
Monocytes Relative: 8 %
Neutro Abs: 4.4 10*3/uL (ref 1.7–7.7)
Neutrophils Relative %: 53 %
Platelets: 263 10*3/uL (ref 150–400)
RBC: 4.96 MIL/uL (ref 3.87–5.11)
RDW: 12.2 % (ref 11.5–15.5)
WBC: 8.5 10*3/uL (ref 4.0–10.5)
nRBC: 0 % (ref 0.0–0.2)

## 2021-12-21 LAB — URINALYSIS, ROUTINE W REFLEX MICROSCOPIC
Bilirubin Urine: NEGATIVE
Glucose, UA: NEGATIVE mg/dL
Ketones, ur: NEGATIVE mg/dL
Leukocytes,Ua: NEGATIVE
Nitrite: NEGATIVE
Protein, ur: NEGATIVE mg/dL
Specific Gravity, Urine: 1.006 (ref 1.005–1.030)
pH: 7 (ref 5.0–8.0)

## 2021-12-21 LAB — LIPASE, BLOOD: Lipase: 29 U/L (ref 11–51)

## 2021-12-21 LAB — COMPREHENSIVE METABOLIC PANEL
ALT: 12 U/L (ref 0–44)
AST: 11 U/L — ABNORMAL LOW (ref 15–41)
Albumin: 4.2 g/dL (ref 3.5–5.0)
Alkaline Phosphatase: 64 U/L (ref 38–126)
Anion gap: 7 (ref 5–15)
BUN: 9 mg/dL (ref 6–20)
CO2: 24 mmol/L (ref 22–32)
Calcium: 8.6 mg/dL — ABNORMAL LOW (ref 8.9–10.3)
Chloride: 113 mmol/L — ABNORMAL HIGH (ref 98–111)
Creatinine, Ser: 0.74 mg/dL (ref 0.44–1.00)
GFR, Estimated: >60 mL/min (ref >60)
Glucose, Bld: 97 mg/dL (ref 70–99)
Potassium: 3 mmol/L — ABNORMAL LOW (ref 3.5–5.1)
Sodium: 144 mmol/L (ref 135–145)
Total Bilirubin: 0.4 mg/dL (ref 0.3–1.2)
Total Protein: 7.1 g/dL (ref 6.5–8.1)

## 2021-12-21 MED ORDER — DICYCLOMINE HCL 10 MG PO CAPS: 10.0000 mg | ORAL_CAPSULE | Freq: Three times a day (TID) | ORAL | 0 refills | Status: DC

## 2021-12-21 MED ORDER — HYDROMORPHONE HCL 1 MG/ML IJ SOLN
0.5000 mg | Freq: Once | INTRAMUSCULAR | Status: AC
  Administered 2021-12-21: 0.5 mg via INTRAVENOUS
  Filled 2021-12-21: qty 0.5

## 2021-12-21 MED ORDER — PANTOPRAZOLE SODIUM 40 MG IV SOLR
40.0000 mg | Freq: Once | INTRAVENOUS | Status: AC
  Administered 2021-12-21: 40 mg via INTRAVENOUS
  Filled 2021-12-21: qty 10

## 2021-12-21 MED ORDER — SUCRALFATE 1 GM/10ML PO SUSP: 1.0000 g | Freq: Three times a day (TID) | ORAL | 0 refills | Status: DC

## 2021-12-21 MED ORDER — LIDOCAINE VISCOUS HCL 2 % MT SOLN
15.0000 mL | Freq: Once | OROMUCOSAL | Status: AC
  Administered 2021-12-21: 15 mL via ORAL
  Filled 2021-12-21: qty 15

## 2021-12-21 MED ORDER — OXYCODONE-ACETAMINOPHEN 5-325 MG PO TABS
2.0000 | ORAL_TABLET | Freq: Once | ORAL | Status: AC
  Administered 2021-12-21: 2 via ORAL
  Filled 2021-12-21: qty 2

## 2021-12-21 MED ORDER — FAMOTIDINE IN NACL 20-0.9 MG/50ML-% IV SOLN
20.0000 mg | Freq: Once | INTRAVENOUS | Status: AC
  Administered 2021-12-21: 20 mg via INTRAVENOUS
  Filled 2021-12-21: qty 50

## 2021-12-21 MED ORDER — FAMOTIDINE 20 MG PO TABS: 20.0000 mg | ORAL_TABLET | Freq: Two times a day (BID) | ORAL | 0 refills | Status: DC

## 2021-12-21 MED ORDER — ALUM & MAG HYDROXIDE-SIMETH 200-200-20 MG/5ML PO SUSP
30.0000 mL | Freq: Once | ORAL | Status: AC
  Administered 2021-12-21: 30 mL via ORAL
  Filled 2021-12-21: qty 30

## 2021-12-21 MED ORDER — OXYCODONE-ACETAMINOPHEN 5-325 MG PO TABS: 1.0000 | ORAL_TABLET | Freq: Three times a day (TID) | ORAL | 0 refills | Status: DC | PRN

## 2021-12-21 MED ORDER — ESOMEPRAZOLE MAGNESIUM 20 MG PO CPDR: 20.0000 mg | DELAYED_RELEASE_CAPSULE | Freq: Two times a day (BID) | ORAL | 5 refills | Status: DC

## 2021-12-21 MED ORDER — ONDANSETRON HCL 4 MG/2ML IJ SOLN
4.0000 mg | Freq: Once | INTRAMUSCULAR | Status: AC
  Administered 2021-12-21: 4 mg via INTRAVENOUS
  Filled 2021-12-21: qty 2

## 2021-12-23 LAB — URINE CULTURE

## 2021-12-31 IMAGING — DX DG CHEST 1V PORT
2 series · 2 of 2 positions shown · non-contrast
Comparison: Radiograph 06/08/2020, CT 10/08/2015

CLINICAL DATA: Anxiety, hypertension

EXAM:
PORTABLE CHEST 1 VIEW

[chest ap (1 of 2)]
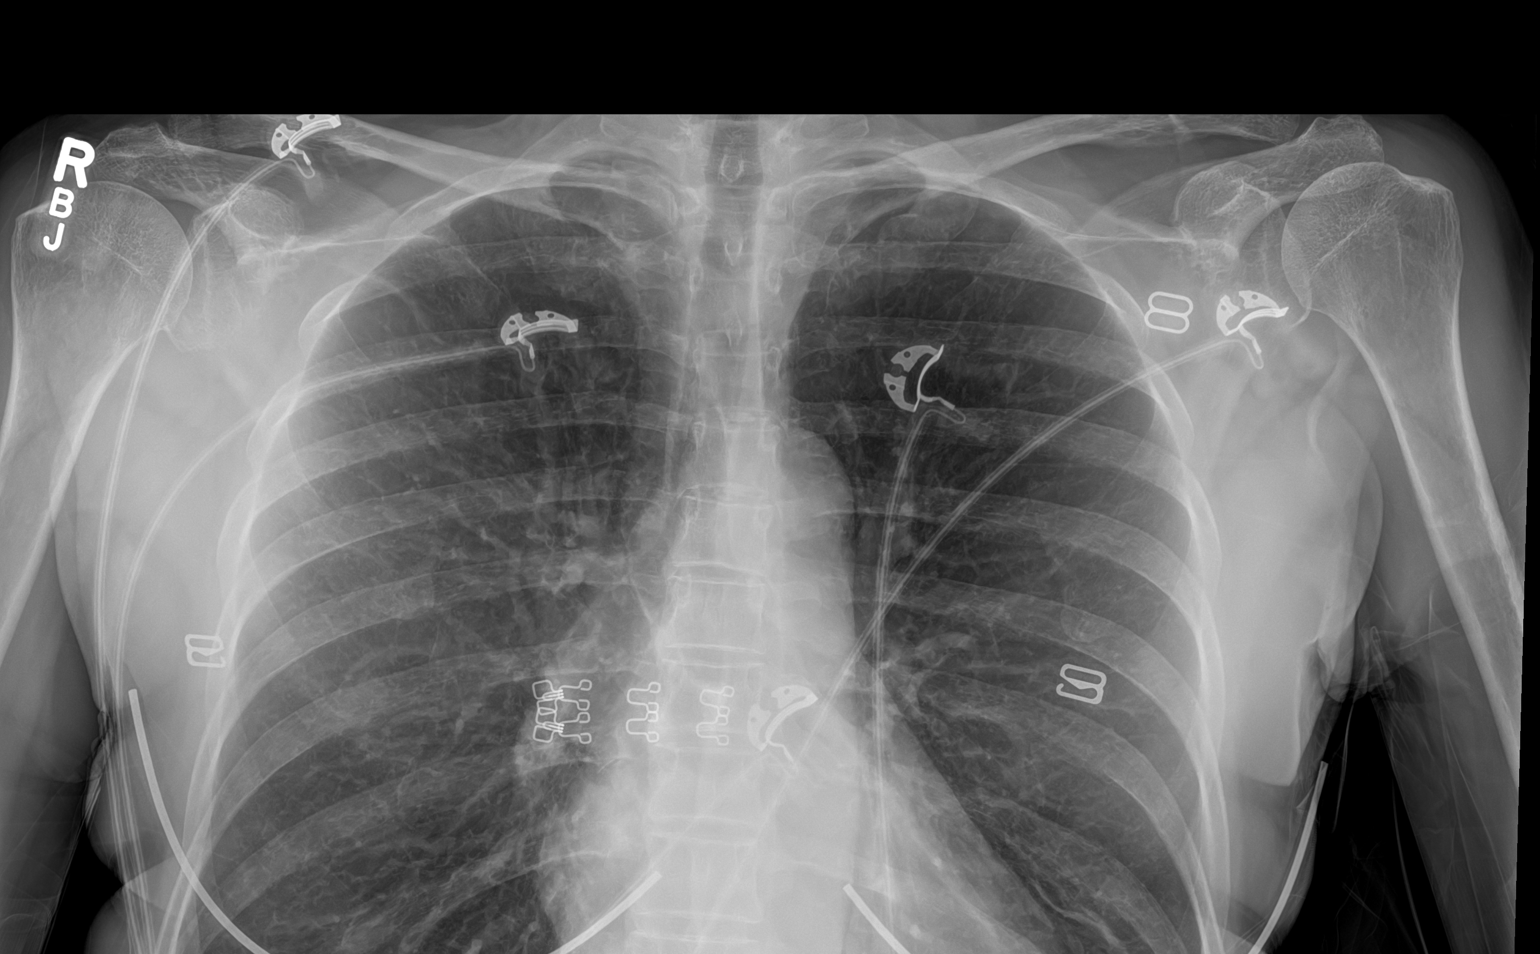

[chest ap (2 of 2)]
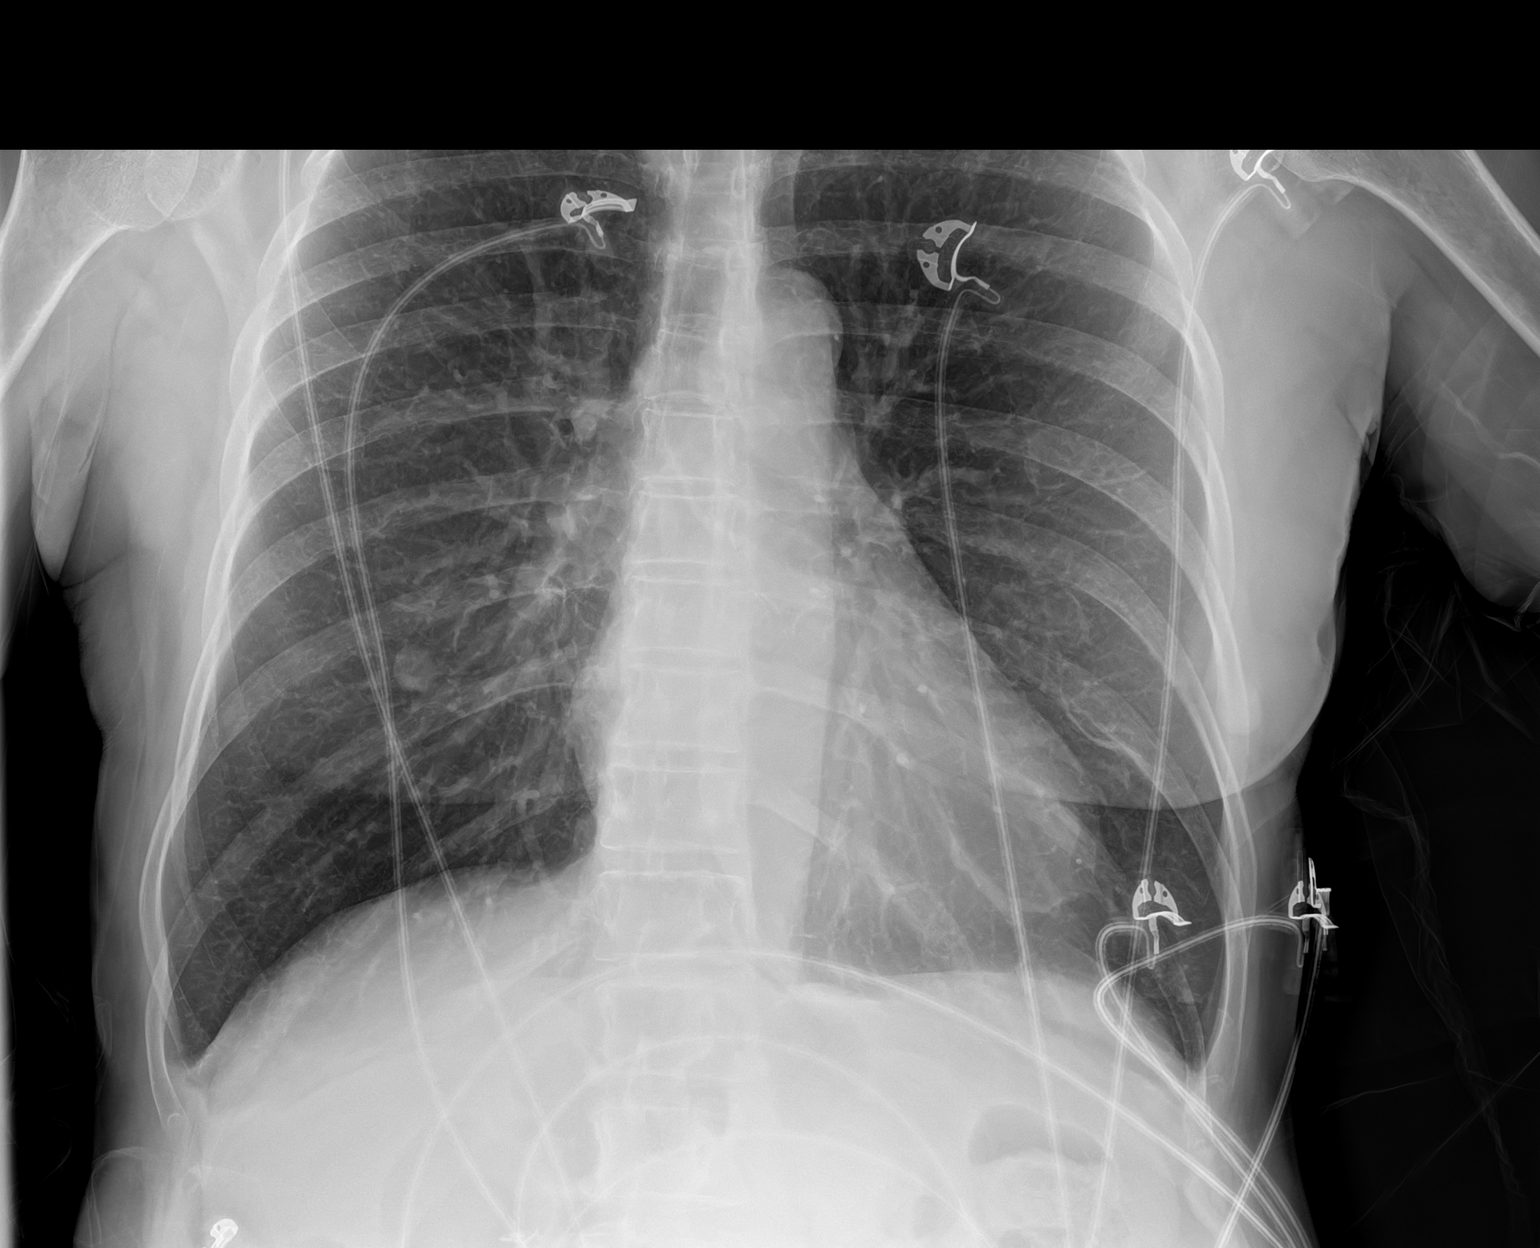

[2 of 2 positions shown; findings below may reference images not displayed]

FINDINGS: No consolidation, features of edema, pneumothorax, or effusion.
Pulmonary vascularity is normally distributed. The cardiomediastinal
contours are unremarkable. No acute osseous or soft tissue
abnormality.
IMPRESSION: No acute cardiopulmonary abnormality.

## 2022-01-03 ENCOUNTER — Encounter (HOSPITAL_COMMUNITY): Payer: Self-pay | Admitting: Emergency Medicine

## 2022-01-03 ENCOUNTER — Emergency Department (HOSPITAL_COMMUNITY)
Admission: EM | Admit: 2022-01-03 | Discharge: 2022-01-03 | Disposition: A | Discharge status: Home / Self Care | Payer: Medicaid Other | Attending: Student | Admitting: Student

## 2022-01-03 ENCOUNTER — Other Ambulatory Visit: Payer: Self-pay

## 2022-01-03 DIAGNOSIS — G8929 Other chronic pain: Secondary | ICD-10-CM

## 2022-01-03 DIAGNOSIS — K2950 Unspecified chronic gastritis without bleeding: Secondary | ICD-10-CM

## 2022-01-03 DIAGNOSIS — R1013 Epigastric pain: Secondary | ICD-10-CM | POA: Insufficient documentation

## 2022-01-03 HISTORY — DX: Gastric ulcer, unspecified as acute or chronic, without hemorrhage or perforation: K25.9

## 2022-01-03 HISTORY — DX: Gastritis, unspecified, without bleeding: K29.70

## 2022-01-03 LAB — URINALYSIS, ROUTINE W REFLEX MICROSCOPIC
Bilirubin Urine: NEGATIVE
Glucose, UA: NEGATIVE mg/dL
Ketones, ur: NEGATIVE mg/dL
Leukocytes,Ua: NEGATIVE
Nitrite: NEGATIVE
Protein, ur: NEGATIVE mg/dL
Specific Gravity, Urine: 1.005 (ref 1.005–1.030)
pH: 7 (ref 5.0–8.0)

## 2022-01-03 LAB — CBC WITH DIFFERENTIAL/PLATELET
Abs Immature Granulocytes: 0.03 10*3/uL (ref 0.00–0.07)
Basophils Absolute: 0.1 10*3/uL (ref 0.0–0.1)
Basophils Relative: 1 %
Eosinophils Absolute: 0.1 10*3/uL (ref 0.0–0.5)
Eosinophils Relative: 1 %
HCT: 44 % (ref 36.0–46.0)
Hemoglobin: 15.5 g/dL — ABNORMAL HIGH (ref 12.0–15.0)
Immature Granulocytes: 0 %
Lymphocytes Relative: 25 %
Lymphs Abs: 2 10*3/uL (ref 0.7–4.0)
MCH: 31.1 pg (ref 26.0–34.0)
MCHC: 35.2 g/dL (ref 30.0–36.0)
MCV: 88.4 fL (ref 80.0–100.0)
Monocytes Absolute: 0.5 10*3/uL (ref 0.1–1.0)
Monocytes Relative: 7 %
Neutro Abs: 5.4 10*3/uL (ref 1.7–7.7)
Neutrophils Relative %: 66 %
Platelets: 220 10*3/uL (ref 150–400)
RBC: 4.98 MIL/uL (ref 3.87–5.11)
RDW: 12.1 % (ref 11.5–15.5)
WBC: 8.1 10*3/uL (ref 4.0–10.5)
nRBC: 0 % (ref 0.0–0.2)

## 2022-01-03 LAB — COMPREHENSIVE METABOLIC PANEL
ALT: 17 U/L (ref 0–44)
AST: 13 U/L — ABNORMAL LOW (ref 15–41)
Albumin: 4.2 g/dL (ref 3.5–5.0)
Alkaline Phosphatase: 64 U/L (ref 38–126)
Anion gap: 7 (ref 5–15)
BUN: 15 mg/dL (ref 6–20)
CO2: 25 mmol/L (ref 22–32)
Calcium: 8.8 mg/dL — ABNORMAL LOW (ref 8.9–10.3)
Chloride: 109 mmol/L (ref 98–111)
Creatinine, Ser: 0.81 mg/dL (ref 0.44–1.00)
GFR, Estimated: >60 mL/min (ref >60)
Glucose, Bld: 82 mg/dL (ref 70–99)
Potassium: 3.7 mmol/L (ref 3.5–5.1)
Sodium: 141 mmol/L (ref 135–145)
Total Bilirubin: 0.5 mg/dL (ref 0.3–1.2)
Total Protein: 7.1 g/dL (ref 6.5–8.1)

## 2022-01-03 LAB — LIPASE, BLOOD: Lipase: 35 U/L (ref 11–51)

## 2022-01-03 LAB — TROPONIN I (HIGH SENSITIVITY): Troponin I (High Sensitivity): 3 ng/L (ref <18)

## 2022-01-03 MED ORDER — PANTOPRAZOLE SODIUM 40 MG IV SOLR
40.0000 mg | Freq: Once | INTRAVENOUS | Status: AC
  Administered 2022-01-03: 40 mg via INTRAVENOUS
  Filled 2022-01-03: qty 10

## 2022-01-03 MED ORDER — FAMOTIDINE 20 MG PO TABS
20.0000 mg | ORAL_TABLET | Freq: Once | ORAL | Status: AC
  Administered 2022-01-03: 20 mg via ORAL
  Filled 2022-01-03: qty 1

## 2022-01-03 MED ORDER — ALUM & MAG HYDROXIDE-SIMETH 200-200-20 MG/5ML PO SUSP
30.0000 mL | Freq: Once | ORAL | Status: AC
  Administered 2022-01-03: 30 mL via ORAL
  Filled 2022-01-03: qty 30

## 2022-01-03 MED ORDER — SUCRALFATE 1 G PO TABS: 1.0000 g | ORAL_TABLET | Freq: Three times a day (TID) | ORAL | 0 refills | Status: DC

## 2022-01-03 MED ORDER — DROPERIDOL 2.5 MG/ML IJ SOLN
1.2500 mg | Freq: Once | INTRAMUSCULAR | Status: AC
  Administered 2022-01-03: 1.25 mg via INTRAVENOUS
  Filled 2022-01-03: qty 2

## 2022-01-03 MED ORDER — DIPHENHYDRAMINE HCL 50 MG/ML IJ SOLN
25.0000 mg | Freq: Once | INTRAMUSCULAR | Status: AC
  Administered 2022-01-03: 25 mg via INTRAVENOUS
  Filled 2022-01-03: qty 1

## 2022-01-03 MED ORDER — HYDROMORPHONE HCL 1 MG/ML IJ SOLN
0.5000 mg | Freq: Once | INTRAMUSCULAR | Status: AC
  Administered 2022-01-03: 0.5 mg via INTRAVENOUS
  Filled 2022-01-03: qty 0.5

## 2022-01-03 NOTE — ED Provider Notes (Signed)
Physical Exam  BP (!) 158/101 (BP Location: Right Arm)   Pulse 94   Temp 98.1 F (36.7 C) (Oral)   Resp 18   SpO2 98%   Physical Exam Vitals and nursing note reviewed.  Constitutional:      General: She is not in acute distress.    Appearance: Normal appearance.  HENT:     Head: Normocephalic and atraumatic.  Eyes:     General:        Right eye: No discharge.        Left eye: No discharge.  Cardiovascular:     Comments: Regular rate and rhythm.  S1/S2 are distinct without any evidence of murmur, rubs, or gallops.  Radial pulses are 2+ bilaterally.  Dorsalis pedis pulses are 2+ bilaterally.  No evidence of pedal edema. Pulmonary:     Comments: Clear to auscultation bilaterally.  Normal effort.  No respiratory distress.  No evidence of wheezes, rales, or rhonchi heard throughout. Abdominal:     General: Abdomen is flat. Bowel sounds are normal. There is no distension.     Tenderness: There is no abdominal tenderness. There is no guarding or rebound.  Musculoskeletal:        General: Normal range of motion.     Cervical back: Neck supple.  Skin:    General: Skin is warm and dry.     Findings: No rash.  Neurological:     General: No focal deficit present.     Mental Status: She is alert.  Psychiatric:        Mood and Affect: Mood normal.        Behavior: Behavior normal.     Procedures  Procedures  ED Course / MDM   Clinical Course as of 01/03/22 2138  Caleen Essex Jan 03, 2022  2121 Patient still complaining of epigastric burning.  Patient states that she suffers from epigastric burning every single day although it has been worse today.  I did went over the labs with her at the bedside and expressed that we will not likely be solving her pain today and I am going to give her 1 dose of Dilaudid and discharge her.  Patient amendable to this plan.  She will follow-up with her GI doctor later this month. [CF]    Clinical Course User Index [CF] Teressa Lower, PA-C   Medical  Decision Making Amount and/or Complexity of Data Reviewed Labs: ordered.  Risk OTC drugs. Prescription drug management.   Accepted handoff at shift change from Idol PA-C. Please see prior provider note for more detail.   Briefly: Patient is 56 y.o. female with history of gastritis diagnosed at EGD 1 year ago who presents to the emergency department with chronic abdominal pain.  Patient has had numerous visits in the ED for similar symptoms.  Patient states she has pain every single day but today has been worse.  She denies any abdominal distention, dysuria, shortness of breath, fever, chills.  Followed by Dr. Marletta Lor with gastroenterology.   Plan: Patient's labs ultimately unremarkable.  I personally reviewed all the labs including CBC which was normal.  CMP showed no abnormalities.  Lipase negative.  Initial and delta troponin are negative.  Urinalysis normal.  Patient was still in a lot of pain.  At signout patient was given Benadryl and droperidol.  Will plan to reassess.  Patient safe for discharge at this time.  Please see ED course. I have given her a good Rx coupon for sucralfate.  She  was encouraged to return to the [facility] for any worsening symptoms.    RISR  EDTHIS        Jolyn Lent 01/03/22 2139    Glendora Score, MD 01/05/22 [phone]

## 2022-01-03 NOTE — Discharge Instructions (Signed)
I given you a discount on sucralfate.  You can fill this at any [facility] pharmacy discount price.  Please follow-up with your gastroenterologist for further evaluation.  Please return to the emergency room for any worsening symptoms you might have.

## 2022-01-03 NOTE — ED Provider Notes (Signed)
[facility] EMERGENCY DEPARTMENT Provider Note   CSN: [id number] Arrival date & time: 01/03/22  1703     History  Chief Complaint  Patient presents with   Abdominal Pain    Marie Diaz is a 56 y.o. 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  She describes sharp stabbing pain in her epigastric region which radiates up into her chest, also described as burning in character.  She denies nausea, vomiting, diarrhea and denies abdominal distention, dysuria, back pain, shortness of breath, she also denies fevers or chills.  She has seen Dr. Abbey Chatters in the past and is anticipating an office visit with him on July 31.  In the interim she is taking  Bentyl, Pepcid and Nexium which she states were initially helping her but have gradually stopped working.  She was also prescribed Carafate but is unable to afford this medication.    The history is provided by the patient.       Home Medications Prior to Admission medications   Medication Sig Start Date End Date Taking? Authorizing Provider  ALPRAZolam Duanne Moron) 1 MG tablet Take 1 mg by mouth at bedtime.     [provider]  amitriptyline (ELAVIL) 25 MG tablet Take 1 tablet (25 mg total) by mouth at bedtime. 03/13/21   Domenic Polite, MD  amLODipine (NORVASC) 10 MG tablet Take 10 mg by mouth daily.     [provider]  cloNIDine (CATAPRES) 0.2 MG tablet Take 0.5 tablets (0.1 mg total) by mouth 2 (two) times daily. 03/13/21   Domenic Polite, MD  dicyclomine (BENTYL) 10 MG capsule Take 1 capsule (10 mg total) by mouth 3 (three) times daily before meals. 12/21/21   Mesner, Corene Cornea, MD  esomeprazole (NEXIUM) 20 MG capsule Take 1 capsule (20  mg total) by mouth 2 (two) times daily before a meal. 12/21/21 06/19/22  Mesner, Corene Cornea, MD  famotidine (PEPCID) 20 MG tablet Take 1 tablet (20 mg total) by mouth 2 (two) times daily. 12/21/21   Mesner, Corene Cornea, MD  lisinopril (ZESTRIL) 20 MG tablet Take 20 mg by mouth daily.     [provider]  oxyCODONE-acetaminophen (PERCOCET/ROXICET) 5-325 MG tablet Take 1-2 tablets by mouth every 8 (eight) hours as needed for severe pain. 12/21/21   Mesner, Corene Cornea, MD  sucralfate (CARAFATE) 1 GM/10ML suspension Take 10 mLs (1 g total) by mouth 4 (four) times daily -  with meals and at bedtime. 12/21/21   Mesner, Corene Cornea, MD      Allergies    Tetracyclines & related and Ultram [tramadol]    Review of Systems   Review of Systems  Constitutional:  Negative for fever.  HENT:  Negative for congestion and sore throat.   Eyes: Negative.   Respiratory:  Negative for chest tightness and shortness of breath.   Cardiovascular:  Negative for chest pain.  Gastrointestinal:  Positive for abdominal pain and diarrhea. Negative for abdominal distention, constipation and nausea.  Genitourinary: Negative.   Musculoskeletal:  Negative for arthralgias, joint swelling and neck pain.  Skin: Negative.  Negative for rash and wound.  Neurological:  Negative for dizziness, weakness, light-headedness, numbness and headaches.  Psychiatric/Behavioral: Negative.      Physical  Exam Updated Vital Signs BP (!) 158/101 (BP Location: Right Arm)   Pulse 94   Temp 98.1 F (36.7 C) (Oral)   Resp 18   SpO2 98%  Physical Exam Vitals and nursing note reviewed.  Constitutional:      Appearance: She is well-developed.  HENT:     Head: Normocephalic and atraumatic.  Eyes:     Conjunctiva/sclera: Conjunctivae normal.  Cardiovascular:     Rate and Rhythm: Normal rate and regular rhythm.     Heart sounds: Normal heart sounds.  Pulmonary:     Effort: Pulmonary effort is normal.     Breath sounds: Normal breath sounds. No wheezing.   Abdominal:     General: Abdomen is flat. Bowel sounds are normal.     Palpations: Abdomen is soft.     Tenderness: There is abdominal tenderness in the epigastric area. There is guarding. There is no rebound. Negative signs include Murphy's sign and McBurney's sign.  Musculoskeletal:        General: Normal range of motion.     Cervical back: Normal range of motion.  Skin:    General: Skin is warm and dry.  Neurological:     Mental Status: She is alert.     ED Results / Procedures / Treatments   Labs (all labs ordered are listed, but only abnormal results are displayed) Labs Reviewed  CBC WITH DIFFERENTIAL/PLATELET - Abnormal; Notable for the following components:      Result Value   Hemoglobin 15.5 (*)    All other components within normal limits  COMPREHENSIVE METABOLIC PANEL - Abnormal; Notable for the following components:   Calcium 8.8 (*)    AST 13 (*)    All other components within normal limits  LIPASE, BLOOD  URINALYSIS, ROUTINE W REFLEX MICROSCOPIC  TROPONIN I (HIGH SENSITIVITY)    EKG  ED ECG REPORT   Date: 01/03/2022  Rate: 60  Rhythm: normal sinus rhythm  QRS Axis: normal  Intervals: normal  ST/T Wave abnormalities: normal  Conduction Disutrbances:none  Narrative Interpretation:   Old EKG Reviewed: unchanged  I have personally reviewed the EKG tracing and agree with the computerized printout as noted.  Radiology No results found.  Procedures Procedures    Medications Ordered in ED Medications  droperidol (INAPSINE) 2.5 MG/ML injection 1.25 mg (has no administration in time range)  pantoprazole (PROTONIX) injection 40 mg (40 mg Intravenous Given 01/03/22 1908)  alum & mag hydroxide-simeth (MAALOX/MYLANTA) 200-200-20 MG/5ML suspension 30 mL (30 mLs Oral Given 01/03/22 1908)  famotidine (PEPCID) tablet 20 mg (20 mg Oral Given 01/03/22 1908)  diphenhydrAMINE (BENADRYL) injection 25 mg (25 mg Intravenous Given 01/03/22 1956)    ED Course/ Medical  Decision Making/ A&P                           Medical Decision Making Patient with acute on chronic epigastric pain with radiation into her chest, it is not associated with shortness of breath, she does have a history of gastritis, currently taking Pepcid and Nexium, Bentyl which are no longer effective, patient reporting escalation of pain today.  Scheduled to see Dr. Abbey Chatters at the end of the month.  Lab testing today at this point are reassuring she has a normal WBC count at 8.1.  Her c-Met is essentially unremarkable, her LFTs are normal, her lipase is normal.  Her first troponin is negative at 3.  Pending delta troponin at this time.  Patient  was given Protonix, Maalox and Pepcid without any improvement in her symptoms.  Review of chart indicates that she did get them prove meant in her symptoms after receiving Benadryl and droperidol during a visit for similar symptoms last fall, these medications have been ordered, pending reevaluation at this time.  Discussed with Ranell Patrick, PA-C who assumes patient care.  Amount and/or Complexity of Data Reviewed External Data Reviewed: radiology.    Details: CT imaging reviewed from visit here dated December 21, 2021, unremarkable study, similar symptoms today, repeat CT imaging at this point with normal lab work not felt indicated at this time. Labs: ordered.    Details: Results per above. ECG/medicine tests: ordered and independent interpretation performed.    Details: Normal sinus rhythm.  Risk OTC drugs. Prescription drug management.           Final Clinical Impression(s) / ED Diagnoses Final diagnoses:  None    Rx / DC Orders ED Discharge Orders     None         Landis Martins 01/03/22 1956    Kommor, [place], MD 01/05/22 [id number]

## 2022-01-03 NOTE — ED Triage Notes (Signed)
Pt called ems for abd pain. [facility]bg in [address]. Pt a/o. C/o pain from ruq burning up epigastric/esophagus area. Gastric ulcers and gastritis at present per pt and dr apt July 31. Denies n/v/d. Was placed on 3 meds 4 mths ago with some relief.

## 2022-01-27 ENCOUNTER — Encounter: Payer: Self-pay | Admitting: Gastroenterology

## 2022-01-27 ENCOUNTER — Ambulatory Visit (INDEPENDENT_AMBULATORY_CARE_PROVIDER_SITE_OTHER): Payer: Self-pay | Admitting: Gastroenterology

## 2022-01-27 VITALS — BP 144/88 | HR 79 | Temp 97.8°F | Ht 62.0 in | Wt 111.0 lb

## 2022-01-27 DIAGNOSIS — R1013 Epigastric pain: Secondary | ICD-10-CM

## 2022-01-27 NOTE — Progress Notes (Signed)
GI Office Note    Referring Provider: Rebekah Chesterfield, NP Primary Care Physician:  Rebekah Chesterfield, NP  Primary Gastroenterologist: Hennie Duos. Marletta Lor, DO   Chief Complaint   Chief Complaint  Patient presents with   Abdominal Pain    Centralized     History of Present Illness   Marie Diaz is a 56 y.o. female presenting today for further evaluation of abdominal pain.  She was last seen during September 2022 hospitalization.  She has a history of chronic abdominal pain, weight loss, multiple prior ED presentations for abdominal pain with negative evaluations.  Completed EGD August 2022 with gastritis and pathology with reactive gastropathy negative for H. pylori.  CTA abdomen/pelvis with patent mesenteric vasculature, no acute findings back in September 2022.  Known pancreatic tail lesion less conspicuous by CT during that study. Pancreatic tail lesion, followed by MRI but not felt to be source of her symptoms, overdue for surveillance (was due March 2022).  Was evaluated by psychiatry during September admission and it was felt that she likely had psychosomatic component to her abdominal pain symptoms.  She was started on low-dose dicyclomine and low-dose amitriptyline.  Patient states she has had abdominal pain constantly since her hospitalization in 02/2021. She no showed her 06/2021 appt but states she was unaware of appt. She complains of epigastric all the time, but now radiates into RUQ. Sleeps with cold pack on stomach due to severe burning. Denies heartburn. Sometimes pain bearable but at other times pain is severe enough that she calls EMS to take her to the ED.  She denies any nausea or vomiting.  Notes postprandial abdominal pain, states this is limited her diet.  States her weight got up to 117 pounds but she is back down to 111.  Based on epic, she has 14 pound weight gain since September 2022.  Typically bowel movements are regular.  Recently had some constipation for  3 days.  Denies NSAIDs.  We are really unsure what medications she is taking right now.  She will call us with updated list.  She does tell me she only took amitriptyline for 2 months after she got the hospital and stopped it on her own for unclear reasons.  Patient states she has been under a lot of stress.    Patient registered in the ED on Nov 20, 2021 but never presented when called by triage nurse.  Same thing on December 16, 2021.  Seen in the ED June 24 and July 7.  Labs from January 03, 2022: CMET, lipase, troponin I, CBC all unremarkable.  CT renal stone study in June 2023 with mild distal colonic diverticulosis but otherwise unremarkable.  Medications   Current Outpatient Medications  Medication Sig Dispense Refill   ALPRAZolam (XANAX) 1 MG tablet Take 1 mg by mouth at bedtime.      cloNIDine (CATAPRES) 0.2 MG tablet Take 0.5 tablets (0.1 mg total) by mouth 2 (two) times daily.     dicyclomine (BENTYL) 10 MG capsule Take 1 capsule (10 mg total) by mouth 3 (three) times daily before meals. 60 capsule 0   esomeprazole (NEXIUM) 20 MG capsule Take 1 capsule (20 mg total) by mouth 2 (two) times daily before a meal. 60 capsule 5   famotidine (PEPCID) 20 MG tablet Take 1 tablet (20 mg total) by mouth 2 (two) times daily. 30 tablet 0   OLANZapine (ZYPREXA) 20 MG tablet 1 tablet Orally Once a day for 30 day(s)  No current facility-administered medications for this visit.    Allergies   Allergies as of 01/27/2022 - Review Complete 01/27/2022  Allergen Reaction Noted   Tetracyclines & related Other (See Comments) 11/07/2015   Ultram [tramadol] Other (See Comments) 08/01/2012      Review of Systems   General: Negative for anorexia, fever, chills, fatigue, weakness.  See HPI ENT: Negative for hoarseness, difficulty swallowing , nasal congestion. CV: Negative for chest pain, angina, palpitations, dyspnea on exertion, peripheral edema.  Respiratory: Negative for dyspnea at rest, dyspnea on  exertion, cough, sputum, wheezing.  GI: See history of present illness. GU:  Negative for dysuria, hematuria, urinary incontinence, urinary frequency, nocturnal urination.  Endo: Negative for unusual weight change.  See HPI    Physical Exam   BP (!) 144/88 (BP Location: Left Arm, Patient Position: Sitting, Cuff Size: Normal)   Pulse 79   Temp 97.8 F (36.6 C) (Temporal)   Ht 5\' 2"  (1.575 m)   Wt 111 lb (50.3 kg)   SpO2 96%   BMI 20.30 kg/m    General: Well-nourished, well-developed in no acute distress.  Appears older than stated age Eyes: No icterus. Mouth: Oropharyngeal mucosa moist and pink , no lesions erythema or exudate. Lungs: Clear to auscultation bilaterally.  Heart: Regular rate and rhythm, no murmurs rubs or gallops.  Abdomen: Bowel sounds are normal, nontender, nondistended, no hepatosplenomegaly or masses,  no abdominal bruits or hernia , no rebound or guarding.  Could not reproduce abdominal pain on exam. Rectal: Not performed Extremities: No lower extremity edema. No clubbing or deformities. Neuro: Alert and oriented x 4   Skin: Warm and dry, no jaundice.   Psych: Alert and cooperative, normal mood and affect.  Labs   Lab Results  Component Value Date   LIPASE 35 01/03/2022   Lab Results  Component Value Date   CREATININE 0.81 01/03/2022   BUN 15 01/03/2022   NA 141 01/03/2022   K 3.7 01/03/2022   CL 109 01/03/2022   CO2 25 01/03/2022   Lab Results  Component Value Date   ALT 17 01/03/2022   AST 13 (L) 01/03/2022   ALKPHOS 64 01/03/2022   BILITOT 0.5 01/03/2022   Lab Results  Component Value Date   WBC 8.1 01/03/2022   HGB 15.5 (H) 01/03/2022   HCT 44.0 01/03/2022   MCV 88.4 01/03/2022   PLT 220 01/03/2022    Imaging Studies   No results found.  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  Not well seen on prior CTs.  Radiologist previously recommended 67-month follow-up MRI, was due in March 2022.  Patient  declines today wants to do at a later time.  Epigastric pain: Subjective weight loss.  EGD August 2022 with gastritis, no H. pylori.  She denies NSAID or aspirin use.  She reports taking PPI regularly although she is unable to provide me with detailed medication list.  She is supposed to call when she gets home and go over her medications.  Doubt pancreatic tail lesion contributing to her pain.  Work-up last year reassuring.  Possibly some functional overlay.  Colon cancer screening: Recommend colonoscopy once she is able to/willing to complete.   PLAN   Due for MRI of pancreas, patient declines at this time.  Will readdress at later visit.   Patient will call with complete medication list so that further recommendations can be made. Recommend colon cancer screening when patient is ready.  Laureen Ochs. Bobby Rumpf, [place], [facility]

## 2022-01-27 NOTE — Patient Instructions (Signed)
Please call back today with complete list of your medications, strength and how many times a day you take them. We need both prescription and over the counter medications. Once the information is available, I will make recommendations. Whenever you are ready to schedule MRI of your pancreas to follow up on pancreatic lesion, please let me know.

## 2022-02-10 ENCOUNTER — Telehealth: Payer: Self-pay

## 2022-02-10 ENCOUNTER — Encounter (HOSPITAL_COMMUNITY): Payer: Self-pay | Admitting: Emergency Medicine

## 2022-02-10 ENCOUNTER — Other Ambulatory Visit: Payer: Self-pay

## 2022-02-10 ENCOUNTER — Emergency Department (HOSPITAL_COMMUNITY)
Admission: EM | Admit: 2022-02-10 | Discharge: 2022-02-10 | Disposition: A | Discharge status: Home / Self Care | Payer: Self-pay | Attending: Emergency Medicine | Admitting: Emergency Medicine

## 2022-02-10 DIAGNOSIS — K2900 Acute gastritis without bleeding: Secondary | ICD-10-CM | POA: Insufficient documentation

## 2022-02-10 DIAGNOSIS — I10 Essential (primary) hypertension: Secondary | ICD-10-CM | POA: Insufficient documentation

## 2022-02-10 DIAGNOSIS — R200 Anesthesia of skin: Secondary | ICD-10-CM | POA: Insufficient documentation

## 2022-02-10 DIAGNOSIS — R202 Paresthesia of skin: Secondary | ICD-10-CM | POA: Insufficient documentation

## 2022-02-10 DIAGNOSIS — R000 Tachycardia, unspecified: Secondary | ICD-10-CM | POA: Insufficient documentation

## 2022-02-10 LAB — CBC
HCT: 47.2 % — ABNORMAL HIGH (ref 36.0–46.0)
Hemoglobin: 16.4 g/dL — ABNORMAL HIGH (ref 12.0–15.0)
MCH: 30.7 pg (ref 26.0–34.0)
MCHC: 34.7 g/dL (ref 30.0–36.0)
MCV: 88.4 fL (ref 80.0–100.0)
Platelets: 325 10*3/uL (ref 150–400)
RBC: 5.34 MIL/uL — ABNORMAL HIGH (ref 3.87–5.11)
RDW: 12.6 % (ref 11.5–15.5)
WBC: 8.9 10*3/uL (ref 4.0–10.5)
nRBC: 0 % (ref 0.0–0.2)

## 2022-02-10 LAB — MAGNESIUM: Magnesium: 2.2 mg/dL (ref 1.7–2.4)

## 2022-02-10 LAB — TROPONIN I (HIGH SENSITIVITY): Troponin I (High Sensitivity): 3 ng/L (ref <18)

## 2022-02-10 LAB — COMPREHENSIVE METABOLIC PANEL
ALT: 11 U/L (ref 0–44)
AST: 17 U/L (ref 15–41)
Albumin: 4.7 g/dL (ref 3.5–5.0)
Alkaline Phosphatase: 64 U/L (ref 38–126)
Anion gap: 10 (ref 5–15)
BUN: 11 mg/dL (ref 6–20)
CO2: 24 mmol/L (ref 22–32)
Calcium: 9.5 mg/dL (ref 8.9–10.3)
Chloride: 108 mmol/L (ref 98–111)
Creatinine, Ser: 0.79 mg/dL (ref 0.44–1.00)
GFR, Estimated: >60 mL/min (ref >60)
Glucose, Bld: 120 mg/dL — ABNORMAL HIGH (ref 70–99)
Potassium: 4.1 mmol/L (ref 3.5–5.1)
Sodium: 142 mmol/L (ref 135–145)
Total Bilirubin: 0.4 mg/dL (ref 0.3–1.2)
Total Protein: 7.8 g/dL (ref 6.5–8.1)

## 2022-02-10 LAB — LIPASE, BLOOD: Lipase: 28 U/L (ref 11–51)

## 2022-02-10 MED ORDER — FAMOTIDINE IN NACL 20-0.9 MG/50ML-% IV SOLN
20.0000 mg | Freq: Once | INTRAVENOUS | Status: AC
Start: 2022-02-10 — End: 2022-02-10
  Administered 2022-02-10: 20 mg via INTRAVENOUS
  Filled 2022-02-10: qty 50

## 2022-02-10 MED ORDER — SUCRALFATE 1 GM/10ML PO SUSP
1.0000 g | Freq: Once | ORAL | Status: AC
  Administered 2022-02-10: 1 g via ORAL
  Filled 2022-02-10: qty 10

## 2022-02-10 MED ORDER — LACTATED RINGERS IV BOLUS
1000.0000 mL | Freq: Once | INTRAVENOUS | Status: AC
  Administered 2022-02-10: 1000 mL via INTRAVENOUS

## 2022-02-10 MED ORDER — ALUM & MAG HYDROXIDE-SIMETH 200-200-20 MG/5ML PO SUSP
30.0000 mL | Freq: Once | ORAL | Status: AC
  Administered 2022-02-10: 30 mL via ORAL
  Filled 2022-02-10: qty 30

## 2022-02-10 MED ORDER — AMLODIPINE BESYLATE 5 MG PO TABS
10.0000 mg | ORAL_TABLET | Freq: Once | ORAL | Status: AC
  Administered 2022-02-10: 10 mg via ORAL
  Filled 2022-02-10: qty 2

## 2022-02-10 MED ORDER — HYDROMORPHONE HCL 1 MG/ML IJ SOLN
1.0000 mg | Freq: Once | INTRAMUSCULAR | Status: AC
  Administered 2022-02-10: 1 mg via INTRAVENOUS
  Filled 2022-02-10: qty 1

## 2022-02-10 MED ORDER — CLONIDINE HCL 0.2 MG PO TABS
0.2000 mg | ORAL_TABLET | Freq: Once | ORAL | Status: AC
  Administered 2022-02-10: 0.2 mg via ORAL
  Filled 2022-02-10: qty 1

## 2022-02-10 MED ORDER — LIDOCAINE VISCOUS HCL 2 % MT SOLN
15.0000 mL | Freq: Once | OROMUCOSAL | Status: AC
  Administered 2022-02-10: 15 mL via ORAL
  Filled 2022-02-10: qty 15

## 2022-02-10 MED ORDER — FENTANYL CITRATE PF 50 MCG/ML IJ SOSY
50.0000 ug | PREFILLED_SYRINGE | Freq: Once | INTRAMUSCULAR | Status: AC
  Administered 2022-02-10: 50 ug via INTRAVENOUS
  Filled 2022-02-10: qty 1

## 2022-02-10 MED ORDER — SUCRALFATE 1 GM/10ML PO SUSP: 1.0000 g | Freq: Three times a day (TID) | ORAL | 0 refills | Status: DC

## 2022-02-10 MED ORDER — HYDROMORPHONE HCL 1 MG/ML IJ SOLN: 0.5000 mg | Freq: Once | INTRAMUSCULAR | Status: DC

## 2022-02-10 NOTE — Telephone Encounter (Signed)
Agree 

## 2022-02-10 NOTE — ED Provider Notes (Signed)
[facility] EMERGENCY DEPARTMENT Provider Note   CSN: [id number] Arrival date & time: 02/10/22  1459     History {Add pertinent medical, surgical, social history, OB history to HPI:1} Chief Complaint  Patient presents with   Abdominal Pain    Marie Diaz is a 56 y.o. female.   Abdominal Pain Patient presents for epigastric pain.  Medical history includes anxiety, depression, PUD, gastritis, hypertension.  She was seen at [facility] emergency department yesterday for multiple complaints, including right leg numbness and tingling.  She underwent lab work, CT imaging of head, abdomen, and pelvis, and MRI brain.  No acute findings were identified on imaging.  She also reportedly had a new onset atrial flutter.  She was reportedly given antibiotics for UTI.  It is unclear if she left AMA or if she was discharged.  She states that her epigastric pain has been severe.  She has not eaten in several days due to the pain.  She has had peptic ulcers identified on endoscopy in the past.  She states that her current symptoms are similar but worse in severity.     Home Medications Prior to Admission medications   Medication Sig Start Date End Date Taking? Authorizing Provider  ALPRAZolam Prudy Feeler) 1 MG tablet Take 1 mg by mouth at bedtime.     [provider]  cloNIDine (CATAPRES) 0.2 MG tablet Take 0.5 tablets (0.1 mg total) by mouth 2 (two) times daily. 03/13/21   Zannie Cove, MD  dicyclomine (BENTYL) 10 MG capsule Take 1 capsule (10 mg total) by mouth 3 (three) times daily before meals. 12/21/21   Mesner, Barbara Cower, MD  esomeprazole (NEXIUM) 20 MG capsule Take 1 capsule (20 mg total) by mouth 2 (two) times daily before a meal. 12/21/21 06/19/22  Mesner, Barbara Cower, MD  famotidine (PEPCID) 20 MG tablet Take 1 tablet (20 mg total) by mouth 2 (two) times daily. 12/21/21   Mesner, Barbara Cower, MD  OLANZapine (ZYPREXA) 20 MG tablet 1 tablet Orally Once a day for 30 day(s)    [provider]       Allergies    Tetracyclines & related and Ultram [tramadol]    Review of Systems   Review of Systems  Constitutional:  Positive for appetite change.  Gastrointestinal:  Positive for abdominal pain.    Physical Exam Updated Vital Signs BP 107/76   Pulse 93   Temp 98.1 F (36.7 C) (Oral)   Resp 16   Ht 5\' 2"  (1.575 m)   Wt 50.3 kg   SpO2 96%   BMI 20.30 kg/m  Physical Exam Vitals and nursing note reviewed.  Constitutional:      General: She is not in acute distress.    Appearance: She is well-developed. She is ill-appearing. She is not toxic-appearing or diaphoretic.  HENT:     Head: Normocephalic and atraumatic.     Mouth/Throat:     Mouth: Mucous membranes are moist.     Pharynx: Oropharynx is clear.  Eyes:     Extraocular Movements: Extraocular movements intact.     Conjunctiva/sclera: Conjunctivae normal.  Cardiovascular:     Rate and Rhythm: Regular rhythm. Tachycardia present.     Heart sounds: No murmur heard. Pulmonary:     Effort: Pulmonary effort is normal. No respiratory distress.     Breath sounds: Normal breath sounds. No wheezing or rales.  Abdominal:     Palpations: Abdomen is soft.     Tenderness: There is abdominal tenderness in the epigastric area.  There is no guarding or rebound.  Musculoskeletal:        General: No swelling.     Cervical back: Neck supple.  Skin:    General: Skin is warm and dry.  Neurological:     General: No focal deficit present.     Mental Status: She is alert and oriented to person, place, and time.     Comments: BUE tremors present, worse on right  Psychiatric:        Mood and Affect: Mood normal.        Behavior: Behavior normal.     ED Results / Procedures / Treatments   Labs (all labs ordered are listed, but only abnormal results are displayed) Labs Reviewed  COMPREHENSIVE METABOLIC PANEL - Abnormal; Notable for the following components:      Result Value   Glucose, Bld 120 (*)    All other components  within normal limits  CBC - Abnormal; Notable for the following components:   RBC 5.34 (*)    Hemoglobin 16.4 (*)    HCT 47.2 (*)    All other components within normal limits  LIPASE, BLOOD  URINALYSIS, ROUTINE W REFLEX MICROSCOPIC    EKG None  Radiology No results found.  Procedures Procedures  {Document cardiac monitor, telemetry assessment procedure when appropriate:1}  Medications Ordered in ED Medications - No data to display  ED Course/ Medical Decision Making/ A&P                           Medical Decision Making Amount and/or Complexity of Data Reviewed Labs: ordered.   This patient presents to the ED for concern of gastric pain, this involves an extensive number of treatment options, and is a complaint that carries with it a high risk of complications and morbidity.  The differential diagnosis includes ***   Co morbidities that complicate the patient evaluation  ***   Additional history obtained:  Additional history obtained from *** External records from outside source obtained and reviewed including ***   Lab Tests:  I Ordered, and personally interpreted labs.  The pertinent results include:  ***   Imaging Studies ordered:  I ordered imaging studies including ***  I independently visualized and interpreted imaging which showed *** I agree with the radiologist interpretation   Cardiac Monitoring: / EKG:  The patient was maintained on a cardiac monitor.  I personally viewed and interpreted the cardiac monitored which showed an underlying rhythm of: ***   Consultations Obtained:  I requested consultation with the ***,  and discussed lab and imaging findings as well as pertinent plan - they recommend: ***   Problem List / ED Course / Critical interventions / Medication management  Patient presents for a burning epigastric pain.  She states that this has been present for the past several days.  Because of the pain, patient reports that she  has not been eating.  On arrival in the ED, her vital signs are notable for slight tachycardia.  On exam, she does have a bilateral upper extremity tremor.  She states that this is due to the pain.  She denies any alcohol use.  Although soft, patient does have significant tenderness in her epigastric area.  She did undergo CT imaging yesterday.  I suspect that symptoms are secondary to PUD.  Patient was given***for symptomatic relief.  Laboratory work-up was initiated.  Given her p.o. intolerance, IV fluids were started. I ordered medication including ***  for ***  Reevaluation of the patient after these medicines showed that the patient {resolved/improved/worsened:23923::"improved"} I have reviewed the patients home medicines and have made adjustments as needed   Social Determinants of Health:  ***   Test / Admission - Considered:  ***   {Document critical care time when appropriate:1} {Document review of labs and clinical decision tools ie heart score, Chads2Vasc2 etc:1}  {Document your independent review of radiology images, and any outside records:1} {Document your discussion with family members, caretakers, and with consultants:1} {Document social determinants of health affecting pt's care:1} {Document your decision making why or why not admission, treatments were needed:1} Final Clinical Impression(s) / ED Diagnoses Final diagnoses:  None    Rx / DC Orders ED Discharge Orders     None

## 2022-02-10 NOTE — ED Triage Notes (Signed)
Pt BIB [facility] for abdominal pain with gas x 2 days, per pt history of gastritis.

## 2022-02-10 NOTE — Telephone Encounter (Signed)
Pt called stating that she was in a lot of pain and that she was currently at [facility] but that they didn't have a GI doctor there and was wanting to know if she can be seen in the office. I instructed the patient to go to [facility] to be evaluated due to her being in a lot of pain. Pt verbalized understanding.

## 2022-02-10 NOTE — ED Notes (Signed)
Reminded pt a urine sample was needed. At this time pt does not need to go. Pt will call out when needing to go.

## 2022-02-10 NOTE — Discharge Instructions (Addendum)
Continue taking your daily Nexium.  An additional medication was sent to your pharmacy to help promote stomach healing.  Avoid alcohol, smoking, and NSAIDs (ibuprofen, Aleve, Goody's powders, etc.) in order to promote stomach healing as well.  There is a telephone number below that you should call tomorrow to set up a gastroenterologist follow-up appointment.  Return to the emergency department at any time for any worsening of symptoms.

## 2022-02-11 ENCOUNTER — Telehealth: Payer: Self-pay | Admitting: Internal Medicine

## 2022-02-11 NOTE — Telephone Encounter (Signed)
Routing to you for advice in Leslie's absence.

## 2022-02-11 NOTE — Telephone Encounter (Signed)
Pt had OV with [facility] on 7/31 and was seen in the ER yesterday. Please advise if she needs OV or what recommendations does she need. [phone]

## 2022-02-12 NOTE — Telephone Encounter (Signed)
Lmom for pt to return my call.  

## 2022-02-12 NOTE — Telephone Encounter (Signed)
She was seen in the ED and had MRI of head due to concern for CVA. This was normal. CT abd/pelvis with contrast no acute findings.   She was supposed to call with updated med list after last appt. She needs to let us know what she is taking regarding PPI and all of her other medications.   She was supposed to have MRI pancreas as well when willing.

## 2022-02-13 NOTE — Telephone Encounter (Signed)
Lmom for pt to return my call.  

## 2022-02-17 NOTE — Telephone Encounter (Signed)
Lmom for pt to return my call, mailed pt a letter as well asking pt to contact office due to not being able to reach her by phone.

## 2022-03-01 IMAGING — CT CT CTA ABD/PEL W/CM AND/OR W/O CM
3 of 9 series · 12 of 46 positions shown, 18 images · IV contrast (Omnipaque or Isovue)
Comparison: None.

CLINICAL DATA: Epigastric abdominal pain, vomiting, diarrhea,
inflamed ulcers on endoscopy, evaluate for mesenteric ischemia

EXAM:
CTA ABDOMEN AND PELVIS WITHOUT AND WITH CONTRAST
TECHNIQUE: Multidetector CT imaging of the abdomen and pelvis was performed
using the standard protocol during bolus administration of
intravenous contrast. Multiplanar reconstructed images and MIPs were
obtained and reviewed to evaluate the vascular anatomy.
CONTRAST:  75mL OMNIPAQUE IOHEXOL 350 MG/ML SOLN

[Series 4: arterial · axial · arterial · 0.70mm/px · z∈[+724,+918]mm · 5 of 207 slices shown]
[im 25/207  soft-tissue]
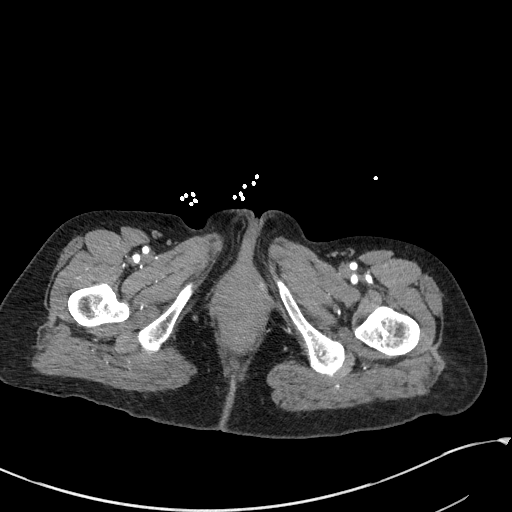
[im 49/207  soft-tissue]
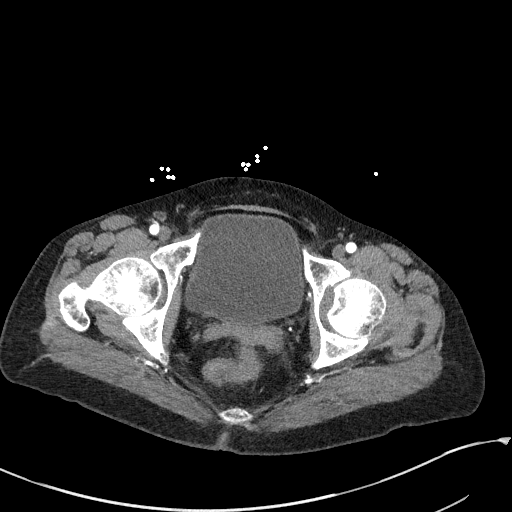
[im 73/207  soft-tissue]
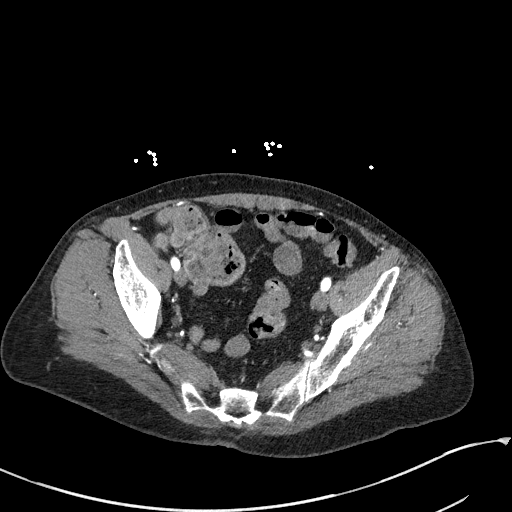
[im 97/207  soft-tissue]
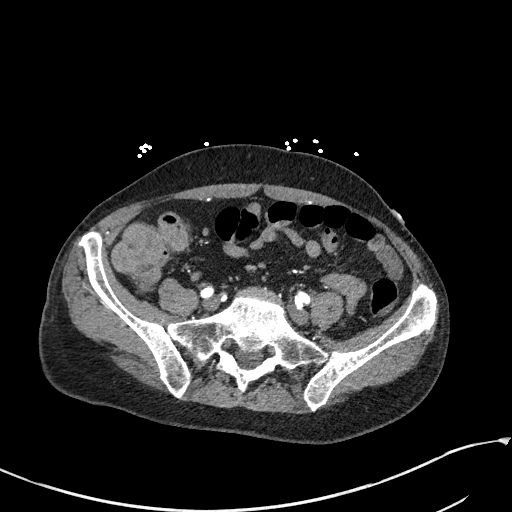
[im 122/207  soft-tissue]
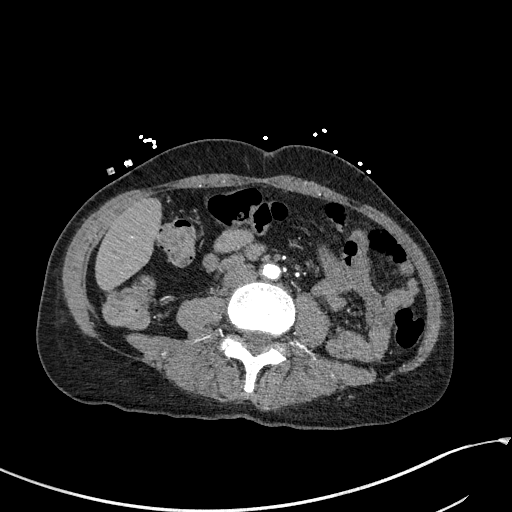

[Series 7: cor art · coronal · 0.78mm/px · 2 of 128 slices shown, 3 images]
[im 43/128  soft-tissue]
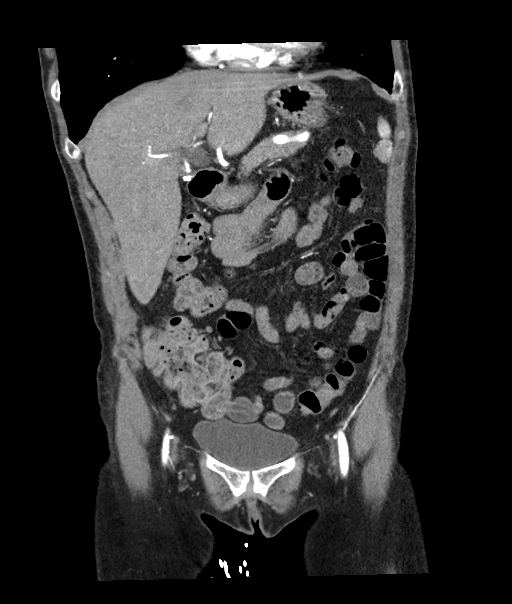
[im 43/128  bone]
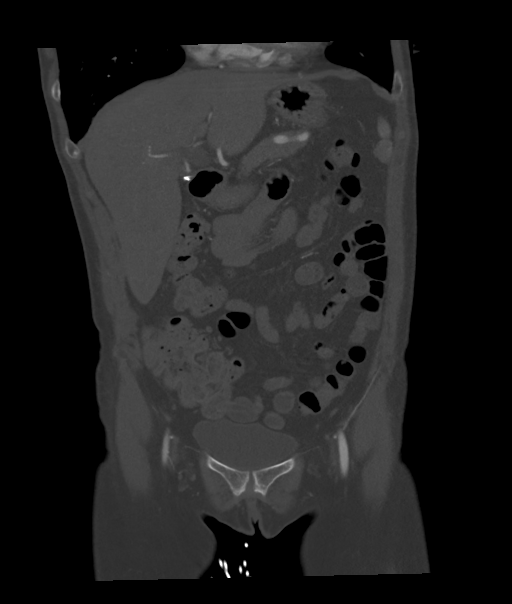
[im 85/128  soft-tissue]
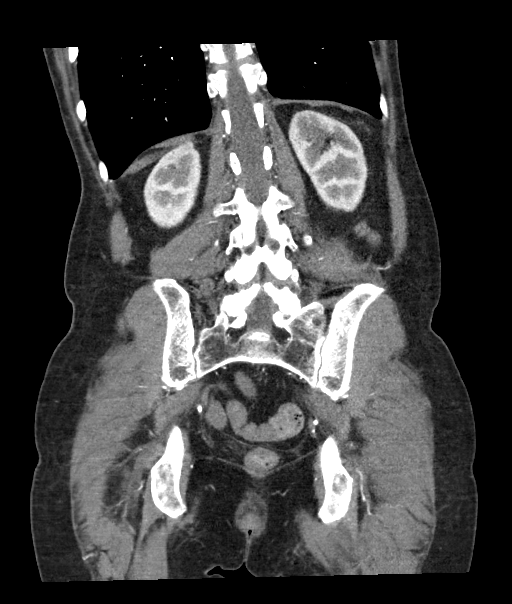

[Series 11: portal venous · axial · portal-venous · 0.69mm/px · z∈[+744,+1019]mm · 5 of 83 slices shown, 10 images]
[im 14/83  soft-tissue]
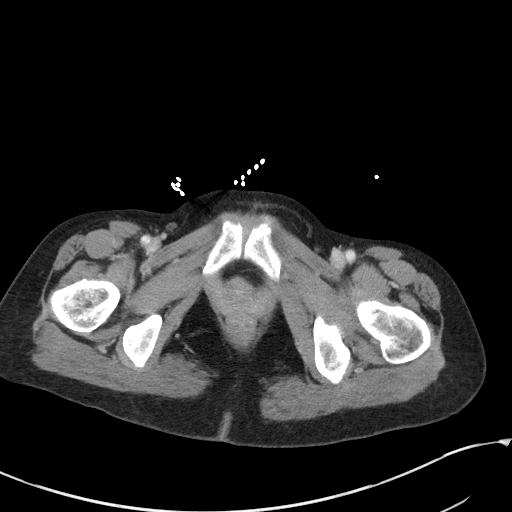
[im 14/83  bone]
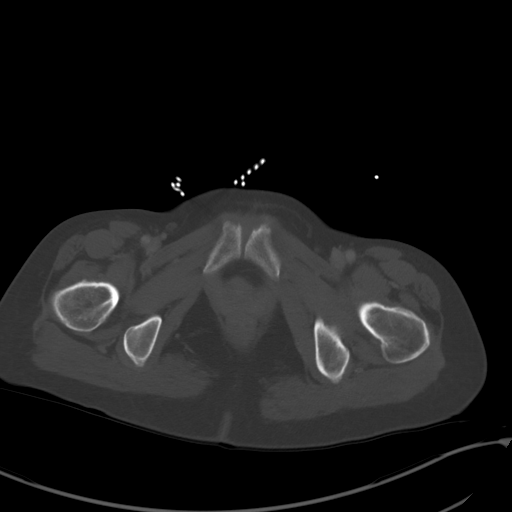
[im 28/83  soft-tissue]
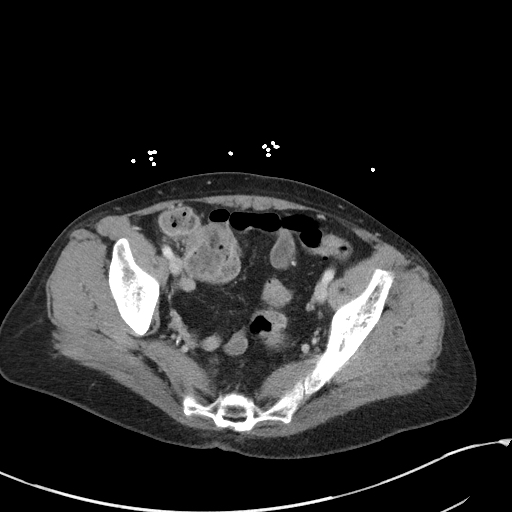
[im 28/83  lung]
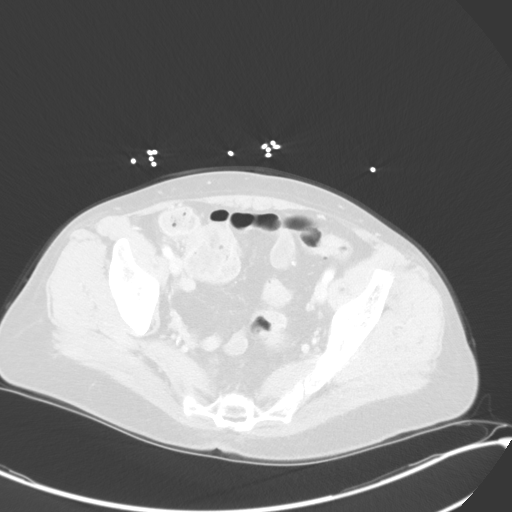
[im 42/83  soft-tissue]
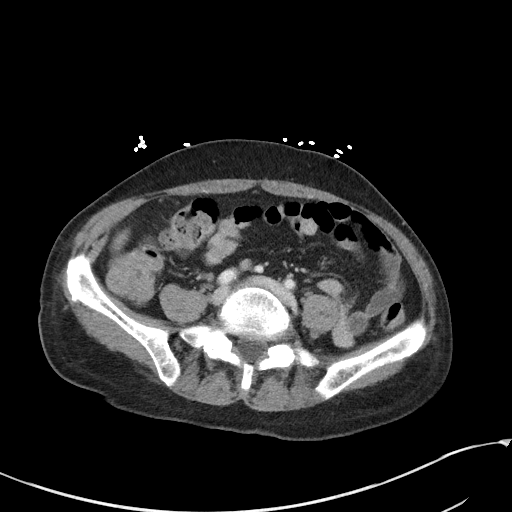
[im 42/83  lung]
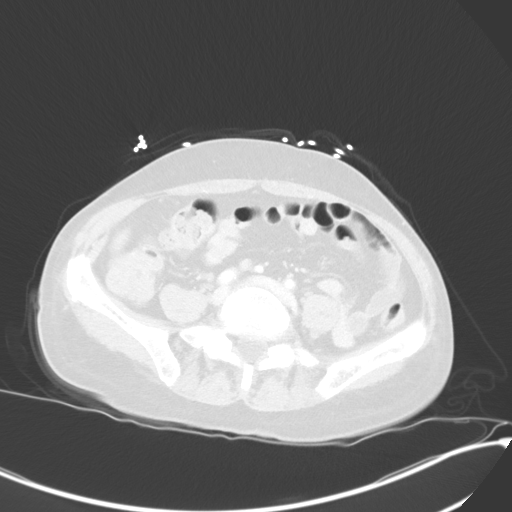
[im 55/83  soft-tissue]
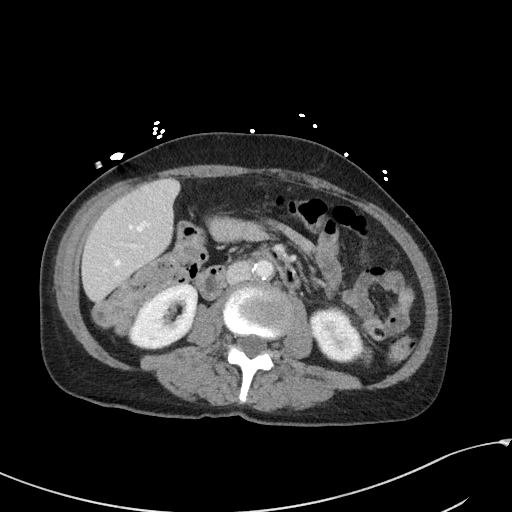
[im 55/83  lung]
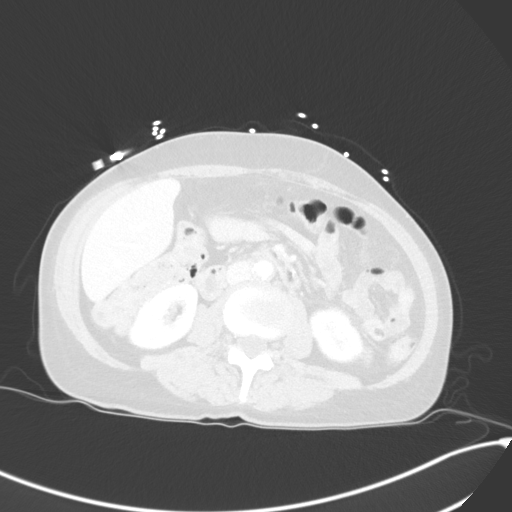
[im 69/83  soft-tissue]
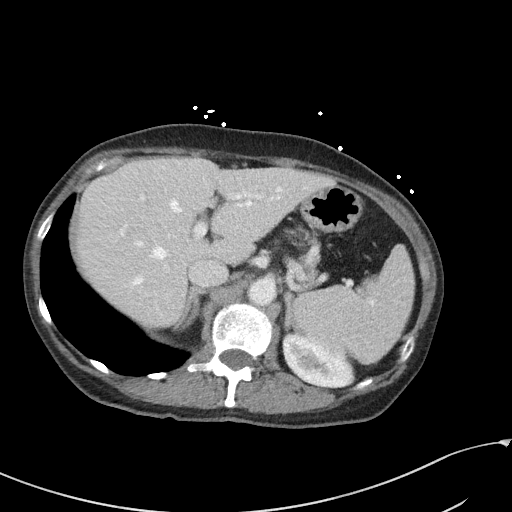
[im 69/83  lung]
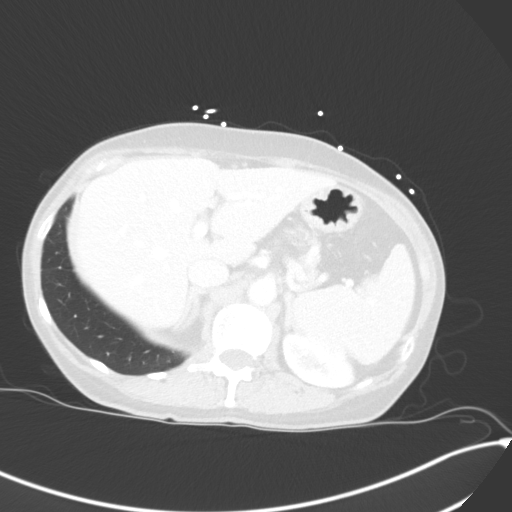

[12 of 46 positions shown; findings below may reference images not displayed]

FINDINGS: VASCULAR

Aorta: Patent.  Atherosclerotic calcifications.

Celiac: Patent.

SMA: Patent.

Renals: Patent bilaterally.

IMA: Patent.

Inflow: Greater than 50% stenosis of the right common iliac artery
(series 4/image 93), unchanged. Otherwise patent.

Proximal Outflow: Patent.  Atherosclerotic calcifications.

Veins: Portal vein, SMV, and IMV are patent. IVC and iliac veins are
unremarkable.

Review of the MIP images confirms the above findings.

NON-VASCULAR

Lower chest: Lung bases are clear.

Hepatobiliary: Liver is within normal limits.

Status post cholecystectomy. No intrahepatic or extrahepatic ductal
dilatation.

Pancreas: Known small pancreatic tail cyst is not well visualized,
but may measure at least 6 mm (series 11/image 16), previously 11
mm.

Spleen: Within normal limits.

Adrenals/Urinary Tract: Adrenal glands are within normal limits.

Kidneys are within normal limits.  No hydronephrosis.

Bladder is within normal limits.

Stomach/Bowel: Stomach is within normal limits.

No evidence of bowel obstruction.

Normal appendix (series 11/image 51).

No bowel wall thickening or inflammatory changes.

Lymphatic: No suspicious abdominopelvic lymphadenopathy.

Reproductive: Status post hysterectomy.

No adnexal masses.

Other: No abdominopelvic ascites.

Musculoskeletal: Visualized osseous structures are within normal
limits.
IMPRESSION: VASCULAR

Patent mesenteric vessels.

Greater than 50% stenosis of the proximal right common iliac artery,
unchanged.

NON-VASCULAR

No evidence of bowel obstruction. Normal appendix. No bowel wall
thickening or inflammatory changes.

6 mm pancreatic tail lesion, less conspicuous than on the prior,
likely benign.

## 2022-03-04 ENCOUNTER — Ambulatory Visit (INDEPENDENT_AMBULATORY_CARE_PROVIDER_SITE_OTHER): Payer: Self-pay | Admitting: Gastroenterology

## 2022-03-04 ENCOUNTER — Encounter: Payer: Self-pay | Admitting: Gastroenterology

## 2022-03-04 ENCOUNTER — Other Ambulatory Visit (HOSPITAL_COMMUNITY)
Admission: RE | Admit: 2022-03-04 | Discharge: 2022-03-04 | Disposition: A | Discharge status: Home / Self Care | Payer: Self-pay | Source: Ambulatory Visit | Attending: Gastroenterology | Admitting: Gastroenterology

## 2022-03-04 ENCOUNTER — Telehealth: Payer: Self-pay | Admitting: *Deleted

## 2022-03-04 VITALS — BP 147/98 | HR 105 | Temp 98.2°F | Wt 100.0 lb

## 2022-03-04 DIAGNOSIS — R112 Nausea with vomiting, unspecified: Secondary | ICD-10-CM | POA: Insufficient documentation

## 2022-03-04 DIAGNOSIS — R1013 Epigastric pain: Secondary | ICD-10-CM | POA: Insufficient documentation

## 2022-03-04 LAB — COMPREHENSIVE METABOLIC PANEL
ALT: 10 U/L (ref 0–44)
AST: 11 U/L — ABNORMAL LOW (ref 15–41)
Albumin: 4.3 g/dL (ref 3.5–5.0)
Alkaline Phosphatase: 60 U/L (ref 38–126)
Anion gap: 9 (ref 5–15)
BUN: 15 mg/dL (ref 6–20)
CO2: 24 mmol/L (ref 22–32)
Calcium: 9.3 mg/dL (ref 8.9–10.3)
Chloride: 108 mmol/L (ref 98–111)
Creatinine, Ser: 0.82 mg/dL (ref 0.44–1.00)
GFR, Estimated: >60 mL/min (ref >60)
Glucose, Bld: 104 mg/dL — ABNORMAL HIGH (ref 70–99)
Potassium: 4.1 mmol/L (ref 3.5–5.1)
Sodium: 141 mmol/L (ref 135–145)
Total Bilirubin: 0.8 mg/dL (ref 0.3–1.2)
Total Protein: 7.1 g/dL (ref 6.5–8.1)

## 2022-03-04 LAB — CBC WITH DIFFERENTIAL/PLATELET
Abs Immature Granulocytes: 0.03 10*3/uL (ref 0.00–0.07)
Basophils Absolute: 0.1 10*3/uL (ref 0.0–0.1)
Basophils Relative: 1 %
Eosinophils Absolute: 0.1 10*3/uL (ref 0.0–0.5)
Eosinophils Relative: 1 %
HCT: 45.6 % (ref 36.0–46.0)
Hemoglobin: 15.3 g/dL — ABNORMAL HIGH (ref 12.0–15.0)
Immature Granulocytes: 0 %
Lymphocytes Relative: 28 %
Lymphs Abs: 2.2 10*3/uL (ref 0.7–4.0)
MCH: 30.4 pg (ref 26.0–34.0)
MCHC: 33.6 g/dL (ref 30.0–36.0)
MCV: 90.7 fL (ref 80.0–100.0)
Monocytes Absolute: 0.5 10*3/uL (ref 0.1–1.0)
Monocytes Relative: 6 %
Neutro Abs: 5.1 10*3/uL (ref 1.7–7.7)
Neutrophils Relative %: 64 %
Platelets: 312 10*3/uL (ref 150–400)
RBC: 5.03 MIL/uL (ref 3.87–5.11)
RDW: 12.4 % (ref 11.5–15.5)
WBC: 7.9 10*3/uL (ref 4.0–10.5)
nRBC: 0 % (ref 0.0–0.2)

## 2022-03-04 LAB — LIPASE, BLOOD: Lipase: 39 U/L (ref 11–51)

## 2022-03-04 MED ORDER — PANTOPRAZOLE SODIUM 40 MG PO TBEC: 40.0000 mg | DELAYED_RELEASE_TABLET | Freq: Two times a day (BID) | ORAL | 3 refills | Status: DC

## 2022-03-04 NOTE — Progress Notes (Signed)
GI Office Note    Referring Provider: Adaline Sill, NP Primary Care Physician:  Adaline Sill, NP  Primary Gastroenterologist: Elon Alas. Abbey Chatters, DO   Chief Complaint   Chief Complaint  Patient presents with   Dysphagia    Feels like has a fire in stomach area    History of Present Illness   Marie Diaz is a 56 y.o. female presenting today for follow up. Last seen 12/2021.  She has a history of chronic abdominal pain, weight loss with multiple prior ED presentations for abdominal pain with negative evaluations.  EGD August 2022 with gastritis and pathology with reactive gastropathy negative for H. pylori. CTA abdomen/pelvis with patent mesenteric vasculature, no acute findings back in September 2022.  Known pancreatic tail lesion less conspicuous by CT during that study. Pancreatic tail lesion, followed by MRI but not felt to be source of her symptoms, overdue for surveillance (was due March 2022).  Was evaluated by psychiatry during September admission and it was felt that she likely had psychosomatic component to her abdominal pain symptoms.  She was started on low-dose dicyclomine and low-dose amitriptyline.  At last visit she was unable to tell us what medication she was on.  We requested her to call back with the symptoms and give further advice but she failed to do so.   CT abdomen pelvis with contrast February 10, 2022 at [facility]: 1. No acute or active process within the abdomen or pelvis.  2. Sigmoid diverticulosis.  3. Evidence of prior cholecystectomy and hysterectomy.   Aortic Atherosclerosis (ICD10-I70.0).    CTA Chest/Abd/Pelvis: 02/2021: -Patent mesenteric vessels -53m pancreatic tail lesion, less conspicuous than on prior, likely benign.  February 10, 2022: Troponin I-3, white blood cell count 8900, hemoglobin 16.4, hematocrit 47.2, platelets 325,000, sodium 142, potassium 4.1, BUN 11, creatinine 0.79, albumin 4.7, AST 17, ALT 11, alk phos 64,  total bilirubin 0.4, lipase 28  Today: complains of constant abdominal pain, burning. Pain is in the epigastric region and radiates to entire upper abdomen. Uses a cold pack with some relief. Feels like her stomach is on fire. Pain is worse on right upper abdomen. She has intermittent N/V, last time a couple of days ago. She states she is not able to eat. Her last BM 8 days ago because she can't eat. Only times she gets relief from abdominal pain is when she is asleep. She takes Xanax at bedtime. Pain does not wake her up. She has not tried Xanax during the day to see if helps her pain. She drinks a lot of Mylanta but no improvement in burning. Bentyl was not helping so she stopped it. She does not recall why she stopped amitriptyline. She is down to 100 pounds. She weighed 111 pounds 3 weeks ago. No melena, brbpr. No nsaids/asa.  States in the ED, GI cocktail and Fentanyl did not help her abdominal pain.   Medications   Current Outpatient Medications  Medication Sig Dispense Refill   ALPRAZolam (XANAX) 1 MG tablet Take 1 mg by mouth at bedtime.      cloNIDine (CATAPRES) 0.2 MG tablet Take 0.5 tablets (0.1 mg total) by mouth 2 (two) times daily.     dicyclomine (BENTYL) 10 MG capsule Take 1 capsule (10 mg total) by mouth 3 (three) times daily before meals. 60 capsule 0   famotidine (PEPCID) 20 MG tablet Take 1 tablet (20 mg total) by mouth 2 (two) times daily. 30 tablet 0  pantoprazole (PROTONIX) 20 MG tablet Take 20 mg by mouth 2 (two) times daily.     No current facility-administered medications for this visit.    Allergies   Allergies as of 03/04/2022 - Review Complete 03/04/2022  Allergen Reaction Noted   Tetracyclines & related Other (See Comments) 11/07/2015   Ultram [tramadol] Other (See Comments) 08/01/2012         Review of Systems   General: Negative for   fever, chills, fatigue, weakness.see hpi ENT: Negative for hoarseness, difficulty swallowing , nasal congestion. CV:  Negative for chest pain, angina, palpitations, dyspnea on exertion, peripheral edema.  Respiratory: Negative for dyspnea at rest, dyspnea on exertion, cough, sputum, wheezing.  GI: See history of present illness. GU:  Negative for dysuria, hematuria, urinary incontinence, urinary frequency, nocturnal urination.  Endo: Negative for unusual weight change. See hpi    Physical Exam   BP (!) 147/98 (BP Location: Right Arm, Patient Position: Sitting, Cuff Size: Normal)   Pulse (!) 105   Temp 98.2 F (36.8 C) (Temporal)   Wt 100 lb (45.4 kg)   SpO2 96%   BMI 18.29 kg/m    General: Well-nourished, well-developed.  in no acute distress.  Eyes: No icterus. Mouth: Oropharyngeal mucosa moist and pink , no lesions erythema or exudate. Lungs: Clear to auscultation bilaterally.  Heart: Regular rate and rhythm, no murmurs rubs or gallops.  Abdomen: Bowel sounds are normal. Moderate epigastric tenderness. nondistended, no hepatosplenomegaly or masses,  no abdominal bruits or hernia , no rebound or guarding.  Rectal: not performed  Extremities: No lower extremity edema. No clubbing or deformities. Neuro: Alert and oriented x 4   Skin: Warm and dry, no jaundice.   Psych: Alert and cooperative, normal mood and affect.  Labs   See above  Imaging Studies   See above  Assessment   Epigastric pain, N/V: cannot exclude gastritis, PUD, refractory GERD but suspect significant functional overlay. Patient frustrated with her pain and inability to eat. We discussed her going to ED for evaluation for dehydration but she declined. Plan to update EGD in the near future.    PLAN   Labs today. Sip on liquids to stay hydrated.  Eat soft easy to digest foods. Encouraged ED evaluation today but patient declined. She should go if persistent abdominal pain, or unable to stay hydrated. Warning signs provided.  Upper endoscopy in near future. ASA 2.  I have discussed the risks, alternatives, benefits with  regards to but not limited to the risk of reaction to medication, bleeding, infection, perforation and the patient is agreeable to proceed. Written consent to be obtained. Stop bentyl. Increase pantoprazole to 92m BID before meals.    LLaureen Ochs LBobby Rumpf MDexter [facility]

## 2022-03-04 NOTE — Telephone Encounter (Signed)
Pt needs an EGD ASA 2 Dr. Lisabeth Pick to contact pt, voice mail was full unable to leave message.

## 2022-03-04 NOTE — Patient Instructions (Addendum)
Please get labs done at the hospital today. We will be in touch with results as soon as available.  Please try to sip on liquids to stay hydrated. When you are not able to eat, in order to maintain your weight you should be drinking liquids with calories such as gatorade, apple juice, etc.  You can also try eating soft foods like mashed potatoes, soups.  If you are unable to urinate several times a day, and urine is dark, this is a sign of dehydration. You should go to the ER for evaluation if you feel like you are dehydrated, dizzy, and abdominal pain does not settle down.  We will work at setting you up for an upper endoscopy.  Stop bentyl (dicyclomine). We called the pharmacy and they said you received pantoprazole 20mg  on 8/17. I have sent in new RX for twice the dose, you will be taking 40mg  TWICE daily before a meal.

## 2022-03-05 NOTE — Telephone Encounter (Signed)
Patient was seen in the office yesterday.

## 2022-03-10 ENCOUNTER — Encounter: Payer: Self-pay | Admitting: *Deleted

## 2022-03-10 NOTE — Telephone Encounter (Signed)
Pt has been scheduled for 03/25/22 at 12:45 pm. Instructions and pre-op visit printed for pt to pick up

## 2022-03-11 ENCOUNTER — Encounter: Payer: Self-pay | Admitting: *Deleted

## 2022-03-11 ENCOUNTER — Telehealth: Payer: Self-pay | Admitting: *Deleted

## 2022-03-11 NOTE — Telephone Encounter (Signed)
Pt says she is having a real bad burning sensation in the middle of her stomach. She states she has been applying a cold compress to the area and didn't go to sleep until around 5 am this morning. What can she do?  Please advise. Thank you

## 2022-03-12 MED ORDER — LIDOCAINE VISCOUS HCL 2 % MT SOLN: OROMUCOSAL | 0 refills | Status: DC

## 2022-03-12 NOTE — Telephone Encounter (Signed)
Plan as outlined a time of recent OV. Please make sure she is following these recommendations.   We can also send in viscous lidocaine take 2 teaspoons every 4-6 hours for abdominal pain. Do not use more than six times per day.

## 2022-03-12 NOTE — Telephone Encounter (Signed)
No ans, vm full.  

## 2022-03-13 NOTE — Telephone Encounter (Signed)
Pt informed of NP message and instructions. She states she has been following the recommendations that were told to her at the office visit. She rescheduled her EGD from 04/24/22 to 04/19/22.

## 2022-03-15 ENCOUNTER — Emergency Department (HOSPITAL_COMMUNITY): Admission: EM | Admit: 2022-03-15 | Payer: Medicaid Other | Source: Home / Self Care

## 2022-03-16 ENCOUNTER — Encounter (HOSPITAL_COMMUNITY): Payer: Self-pay | Admitting: Emergency Medicine

## 2022-03-16 ENCOUNTER — Emergency Department (HOSPITAL_COMMUNITY)
Admission: EM | Admit: 2022-03-16 | Discharge: 2022-03-16 | Disposition: A | Discharge status: Home / Self Care | Payer: Medicaid Other | Attending: Emergency Medicine | Admitting: Emergency Medicine

## 2022-03-16 DIAGNOSIS — R110 Nausea: Secondary | ICD-10-CM | POA: Insufficient documentation

## 2022-03-16 DIAGNOSIS — Z79899 Other long term (current) drug therapy: Secondary | ICD-10-CM | POA: Insufficient documentation

## 2022-03-16 DIAGNOSIS — I10 Essential (primary) hypertension: Secondary | ICD-10-CM | POA: Insufficient documentation

## 2022-03-16 DIAGNOSIS — R1013 Epigastric pain: Secondary | ICD-10-CM | POA: Insufficient documentation

## 2022-03-16 DIAGNOSIS — G8929 Other chronic pain: Secondary | ICD-10-CM | POA: Insufficient documentation

## 2022-03-16 LAB — COMPREHENSIVE METABOLIC PANEL
ALT: 47 U/L — ABNORMAL HIGH (ref 0–44)
AST: 38 U/L (ref 15–41)
Albumin: 4.4 g/dL (ref 3.5–5.0)
Alkaline Phosphatase: 58 U/L (ref 38–126)
Anion gap: 11 (ref 5–15)
BUN: 17 mg/dL (ref 6–20)
CO2: 22 mmol/L (ref 22–32)
Calcium: 8.9 mg/dL (ref 8.9–10.3)
Chloride: 107 mmol/L (ref 98–111)
Creatinine, Ser: 0.64 mg/dL (ref 0.44–1.00)
GFR, Estimated: >60 mL/min (ref >60)
Glucose, Bld: 126 mg/dL — ABNORMAL HIGH (ref 70–99)
Potassium: 3.3 mmol/L — ABNORMAL LOW (ref 3.5–5.1)
Sodium: 140 mmol/L (ref 135–145)
Total Bilirubin: 1 mg/dL (ref 0.3–1.2)
Total Protein: 7.3 g/dL (ref 6.5–8.1)

## 2022-03-16 LAB — CBC
HCT: 42.5 % (ref 36.0–46.0)
Hemoglobin: 14.8 g/dL (ref 12.0–15.0)
MCH: 31 pg (ref 26.0–34.0)
MCHC: 34.8 g/dL (ref 30.0–36.0)
MCV: 88.9 fL (ref 80.0–100.0)
Platelets: 216 10*3/uL (ref 150–400)
RBC: 4.78 MIL/uL (ref 3.87–5.11)
RDW: 11.9 % (ref 11.5–15.5)
WBC: 7.2 10*3/uL (ref 4.0–10.5)
nRBC: 0 % (ref 0.0–0.2)

## 2022-03-16 LAB — LIPASE, BLOOD: Lipase: 32 U/L (ref 11–51)

## 2022-03-16 LAB — POC OCCULT BLOOD, ED: Fecal Occult Bld: NEGATIVE

## 2022-03-16 MED ORDER — SUCRALFATE 1 GM/10ML PO SUSP
1.0000 g | Freq: Once | ORAL | Status: AC
  Administered 2022-03-16: 1 g via ORAL
  Filled 2022-03-16: qty 10

## 2022-03-16 MED ORDER — ALUM & MAG HYDROXIDE-SIMETH 200-200-20 MG/5ML PO SUSP
30.0000 mL | Freq: Once | ORAL | Status: AC
  Administered 2022-03-16: 30 mL via ORAL
  Filled 2022-03-16: qty 30

## 2022-03-16 NOTE — ED Provider Notes (Signed)
[facility] EMERGENCY DEPARTMENT Provider Note   CSN: [id number] Arrival date & time: 03/16/22  0032     History  Chief Complaint  Patient presents with   Abdominal Pain    Marie Diaz is a 56 y.o. female.  The history is provided by the patient.  Abdominal Pain Pain location:  Epigastric Pain quality: burning   Pain radiates to:  RLQ Pain severity:  Severe Onset quality:  Gradual Timing:  Constant Chronicity:  Chronic Context: not alcohol use   Relieved by:  Nothing Associated symptoms: nausea   Associated symptoms: no chest pain, no diarrhea, no hematemesis and no vomiting   Patient with history of depression, chronic abdominal pain presents with increasing abdominal pain and burning.  She reports she has daily abdominal pain for months.  Over the past 3 days it is worsened and she has not gotten out of bed.  She reports very little p.o. intake.  No vomiting.  She does report recent black stools and she is not on iron No bloody stools.  No chest pain She is scheduled for EGD next week    Past Medical History:  Diagnosis Date   Anxiety    Depression    Gastric ulcer    Gastritis    Hypertension     Home Medications Prior to Admission medications   Medication Sig Start Date End Date Taking? Authorizing Provider  ALPRAZolam Duanne Moron) 1 MG tablet Take 1 mg by mouth 3 (three) times daily.    [provider]  cloNIDine (CATAPRES) 0.2 MG tablet Take 0.5 tablets (0.1 mg total) by mouth 2 (two) times daily. Patient taking differently: Take 0.2 mg by mouth 2 (two) times daily. 03/13/21   Domenic Polite, MD  famotidine (PEPCID) 20 MG tablet Take 1 tablet (20 mg total) by mouth 2 (two) times daily. Patient not taking: Reported on 03/13/2022 12/21/21   Mesner, Corene Cornea, MD  lidocaine (XYLOCAINE) 2 % solution Two teaspoons (10cc) by mouth every 4-6 hours as needed for abdominal pain. Do not use more than 6 times in 24 hours. 03/12/22   Mahala Menghini, PA-C  OLANZapine  (ZYPREXA) 20 MG tablet Take 20 mg by mouth in the morning.    [provider]  pantoprazole (PROTONIX) 40 MG tablet Take 1 tablet (40 mg total) by mouth 2 (two) times daily before a meal. 03/04/22   Mahala Menghini, PA-C      Allergies    Tetracyclines & related and Ultram [tramadol]    Review of Systems   Review of Systems  Cardiovascular:  Negative for chest pain.  Gastrointestinal:  Positive for abdominal pain and nausea. Negative for diarrhea, hematemesis and vomiting.    Physical Exam Updated Vital Signs BP (!) 150/107   Pulse 98   Temp 97.9 F (36.6 C) (Oral)   Resp 15   Ht 1.575 m (5\' 2" )   Wt 45.4 kg   SpO2 98%   BMI 18.29 kg/m  Physical Exam CONSTITUTIONAL frail and anxious HEAD: Normocephalic/atraumatic ENMT: Mucous membranes moist NECK: supple no meningeal signs CV: S1/S2 noted, no murmurs/rubs/gallops noted LUNGS: Lungs are clear to auscultation bilaterally, no apparent distress ABDOMEN: soft, nontender, no rebound or guarding, bowel sounds noted throughout abdomen Rectal-no blood or melena, no rectal abscess or mass.  Female chaperone present NEURO: Pt is awake/alert/appropriate, moves all extremitiesx4.  No facial droop.   EXTREMITIES: full ROM SKIN: warm, color normal   ED Results / Procedures / Treatments   Labs (all  labs ordered are listed, but only abnormal results are displayed) Labs Reviewed  COMPREHENSIVE METABOLIC PANEL - Abnormal; Notable for the following components:      Result Value   Potassium 3.3 (*)    Glucose, Bld 126 (*)    ALT 47 (*)    All other components within normal limits  LIPASE, BLOOD  CBC  POC OCCULT BLOOD, ED    EKG EKG Interpretation  Date/Time:  Sunday March 16 2022 02:38:00 EDT Ventricular Rate:  77 PR Interval:  127 QRS Duration: 72 QT Interval:  397 QTC Calculation: 450 R Axis:   84 Text Interpretation: Sinus rhythm Ventricular premature complex Interpretation limited secondary to artifact  Confirmed by Zadie Rhine ([id number]) on 03/16/2022 2:45:35 AM  Radiology No results found.  Procedures Procedures    Medications Ordered in ED Medications  alum & mag hydroxide-simeth (MAALOX/MYLANTA) 200-200-20 MG/5ML suspension 30 mL (30 mLs Oral Given 03/16/22 0122)  sucralfate (CARAFATE) 1 GM/10ML suspension 1 g (1 g Oral Given 03/16/22 0250)    ED Course/ Medical Decision Making/ A&P                           Medical Decision Making Amount and/or Complexity of Data Reviewed Labs: ordered. ECG/medicine tests: ordered.  Risk OTC drugs. Prescription drug management.   Patient with history of chronic abdominal pain presents with flareup of the past 3 days.  Labs overall reassuring.  She is not vomiting.  No focal tenderness.  Patient's had extensive evaluation in the past.  Will defer further work-up at this time        Final Clinical Impression(s) / ED Diagnoses Final diagnoses:  Abdominal pain, chronic, epigastric    Rx / DC Orders ED Discharge Orders     None         Zadie Rhine, MD 03/16/22 [phone]

## 2022-03-16 NOTE — ED Notes (Signed)
Pts family asking to speak with EDP. Family upset that no medications are being ordered for discharge.

## 2022-03-16 NOTE — ED Notes (Signed)
Family in room; clipping pt toe nails

## 2022-03-16 NOTE — ED Triage Notes (Signed)
Pt c/o severe abd pain for the past few months. Pt dx with acute gastritis.

## 2022-03-17 ENCOUNTER — Telehealth: Payer: Self-pay | Admitting: *Deleted

## 2022-03-17 ENCOUNTER — Encounter: Payer: Self-pay | Admitting: *Deleted

## 2022-03-17 NOTE — Telephone Encounter (Signed)
Patient called to reschedule her EGD from 03/20/22 at 8:30 am to 04/03/22 at 12:30 pm.  She states that her mother will be driving her and that appt on 03/20/22 is just too early. Will mail out a new set of instructions.

## 2022-03-18 ENCOUNTER — Encounter (HOSPITAL_COMMUNITY): Admission: RE | Admit: 2022-03-18 | Payer: Medicaid Other | Source: Ambulatory Visit

## 2022-03-19 ENCOUNTER — Encounter (HOSPITAL_COMMUNITY): Payer: Medicaid Other

## 2022-04-02 ENCOUNTER — Encounter (HOSPITAL_COMMUNITY): Payer: Self-pay | Admitting: Anesthesiology

## 2022-04-03 ENCOUNTER — Encounter (HOSPITAL_COMMUNITY): Admission: RE | Payer: Self-pay | Source: Home / Self Care

## 2022-04-03 ENCOUNTER — Ambulatory Visit (HOSPITAL_COMMUNITY): Admission: RE | Admit: 2022-04-03 | Payer: Medicaid Other | Source: Home / Self Care

## 2022-04-03 ENCOUNTER — Telehealth: Payer: Self-pay | Admitting: *Deleted

## 2022-04-03 DIAGNOSIS — R1013 Epigastric pain: Secondary | ICD-10-CM

## 2022-04-03 DIAGNOSIS — R112 Nausea with vomiting, unspecified: Secondary | ICD-10-CM

## 2022-04-03 DIAGNOSIS — R1110 Vomiting, unspecified: Secondary | ICD-10-CM

## 2022-04-03 SURGERY — ESOPHAGOGASTRODUODENOSCOPY (EGD) WITH PROPOFOL
Anesthesia: Monitor Anesthesia Care

## 2022-04-03 NOTE — Telephone Encounter (Signed)
Received message from endo patient no showed for her procedure today.

## 2022-04-03 NOTE — Telephone Encounter (Signed)
Called pt, no answer and not able to leave vm

## 2022-04-07 NOTE — Telephone Encounter (Signed)
Called pt, no answer and no VM 

## 2022-04-11 ENCOUNTER — Encounter (HOSPITAL_COMMUNITY): Payer: Self-pay | Admitting: Psychiatry

## 2022-04-11 ENCOUNTER — Ambulatory Visit (HOSPITAL_COMMUNITY)
Admission: EM | Admit: 2022-04-11 | Discharge: 2022-04-11 | Disposition: A | Discharge status: Other Acute Inpatient Hospital | Payer: No Payment, Other | Attending: Psychiatry | Admitting: Psychiatry

## 2022-04-11 ENCOUNTER — Other Ambulatory Visit: Payer: Self-pay

## 2022-04-11 ENCOUNTER — Inpatient Hospital Stay (HOSPITAL_COMMUNITY)
Admission: AD | Admit: 2022-04-11 | Discharge: 2022-04-28 | DRG: 885 | Disposition: A | Discharge status: Home / Self Care | Payer: Federal, State, Local not specified - Other | Source: Intra-hospital | Attending: Psychiatry | Admitting: Psychiatry

## 2022-04-11 DIAGNOSIS — I10 Essential (primary) hypertension: Secondary | ICD-10-CM | POA: Insufficient documentation

## 2022-04-11 DIAGNOSIS — E538 Deficiency of other specified B group vitamins: Secondary | ICD-10-CM | POA: Diagnosis present

## 2022-04-11 DIAGNOSIS — F411 Generalized anxiety disorder: Secondary | ICD-10-CM | POA: Insufficient documentation

## 2022-04-11 DIAGNOSIS — F323 Major depressive disorder, single episode, severe with psychotic features: Principal | ICD-10-CM | POA: Diagnosis present

## 2022-04-11 DIAGNOSIS — Z79899 Other long term (current) drug therapy: Secondary | ICD-10-CM | POA: Diagnosis not present

## 2022-04-11 DIAGNOSIS — K5900 Constipation, unspecified: Secondary | ICD-10-CM | POA: Insufficient documentation

## 2022-04-11 DIAGNOSIS — R634 Abnormal weight loss: Secondary | ICD-10-CM | POA: Insufficient documentation

## 2022-04-11 DIAGNOSIS — K2950 Unspecified chronic gastritis without bleeding: Secondary | ICD-10-CM | POA: Diagnosis present

## 2022-04-11 DIAGNOSIS — F32A Depression, unspecified: Secondary | ICD-10-CM | POA: Diagnosis not present

## 2022-04-11 DIAGNOSIS — Z8711 Personal history of peptic ulcer disease: Secondary | ICD-10-CM

## 2022-04-11 DIAGNOSIS — Z1152 Encounter for screening for COVID-19: Secondary | ICD-10-CM | POA: Insufficient documentation

## 2022-04-11 DIAGNOSIS — G4700 Insomnia, unspecified: Secondary | ICD-10-CM | POA: Diagnosis present

## 2022-04-11 DIAGNOSIS — F333 Major depressive disorder, recurrent, severe with psychotic symptoms: Secondary | ICD-10-CM | POA: Diagnosis not present

## 2022-04-11 DIAGNOSIS — R251 Tremor, unspecified: Secondary | ICD-10-CM | POA: Insufficient documentation

## 2022-04-11 DIAGNOSIS — Z20822 Contact with and (suspected) exposure to covid-19: Secondary | ICD-10-CM | POA: Diagnosis present

## 2022-04-11 DIAGNOSIS — K219 Gastro-esophageal reflux disease without esophagitis: Secondary | ICD-10-CM | POA: Diagnosis present

## 2022-04-11 DIAGNOSIS — R112 Nausea with vomiting, unspecified: Secondary | ICD-10-CM

## 2022-04-11 DIAGNOSIS — F17210 Nicotine dependence, cigarettes, uncomplicated: Secondary | ICD-10-CM | POA: Diagnosis present

## 2022-04-11 DIAGNOSIS — R1013 Epigastric pain: Secondary | ICD-10-CM

## 2022-04-11 DIAGNOSIS — R45851 Suicidal ideations: Secondary | ICD-10-CM | POA: Diagnosis present

## 2022-04-11 DIAGNOSIS — G3184 Mild cognitive impairment, so stated: Secondary | ICD-10-CM | POA: Diagnosis present

## 2022-04-11 LAB — CBC WITH DIFFERENTIAL/PLATELET
Abs Immature Granulocytes: 0.05 10*3/uL (ref 0.00–0.07)
Basophils Absolute: 0.1 10*3/uL (ref 0.0–0.1)
Basophils Relative: 1 %
Eosinophils Absolute: 0 10*3/uL (ref 0.0–0.5)
Eosinophils Relative: 0 %
HCT: 45.8 % (ref 36.0–46.0)
Hemoglobin: 16.7 g/dL — ABNORMAL HIGH (ref 12.0–15.0)
Immature Granulocytes: 0 %
Lymphocytes Relative: 18 %
Lymphs Abs: 2.2 10*3/uL (ref 0.7–4.0)
MCH: 31.7 pg (ref 26.0–34.0)
MCHC: 36.5 g/dL — ABNORMAL HIGH (ref 30.0–36.0)
MCV: 86.9 fL (ref 80.0–100.0)
Monocytes Absolute: 1.1 10*3/uL — ABNORMAL HIGH (ref 0.1–1.0)
Monocytes Relative: 9 %
Neutro Abs: 8.4 10*3/uL — ABNORMAL HIGH (ref 1.7–7.7)
Neutrophils Relative %: 72 %
Platelets: 291 10*3/uL (ref 150–400)
RBC: 5.27 MIL/uL — ABNORMAL HIGH (ref 3.87–5.11)
RDW: 13 % (ref 11.5–15.5)
WBC: 11.8 10*3/uL — ABNORMAL HIGH (ref 4.0–10.5)
nRBC: 0 % (ref 0.0–0.2)

## 2022-04-11 LAB — LIPID PANEL
Cholesterol: 177 mg/dL (ref 0–200)
HDL: 41 mg/dL (ref >40)
LDL Cholesterol: 97 mg/dL (ref 0–99)
Total CHOL/HDL Ratio: 4.3 RATIO
Triglycerides: 193 mg/dL — ABNORMAL HIGH (ref <150)
VLDL: 39 mg/dL (ref 0–40)

## 2022-04-11 LAB — POCT URINE DRUG SCREEN - MANUAL ENTRY (I-SCREEN)
POC Amphetamine UR: NOT DETECTED
POC Buprenorphine (BUP): NOT DETECTED
POC Cocaine UR: NOT DETECTED
POC Marijuana UR: NOT DETECTED
POC Methadone UR: NOT DETECTED
POC Methamphetamine UR: NOT DETECTED
POC Morphine: NOT DETECTED
POC Oxazepam (BZO): POSITIVE — AB
POC Oxycodone UR: NOT DETECTED
POC Secobarbital (BAR): NOT DETECTED

## 2022-04-11 LAB — COMPREHENSIVE METABOLIC PANEL
ALT: 15 U/L (ref 0–44)
AST: 15 U/L (ref 15–41)
Albumin: 4 g/dL (ref 3.5–5.0)
Alkaline Phosphatase: 69 U/L (ref 38–126)
Anion gap: 12 (ref 5–15)
BUN: <5 mg/dL — ABNORMAL LOW (ref 6–20)
CO2: 24 mmol/L (ref 22–32)
Calcium: 9.4 mg/dL (ref 8.9–10.3)
Chloride: 107 mmol/L (ref 98–111)
Creatinine, Ser: 0.64 mg/dL (ref 0.44–1.00)
GFR, Estimated: >60 mL/min (ref >60)
Glucose, Bld: 110 mg/dL — ABNORMAL HIGH (ref 70–99)
Potassium: 3 mmol/L — ABNORMAL LOW (ref 3.5–5.1)
Sodium: 143 mmol/L (ref 135–145)
Total Bilirubin: 0.5 mg/dL (ref 0.3–1.2)
Total Protein: 6.4 g/dL — ABNORMAL LOW (ref 6.5–8.1)

## 2022-04-11 LAB — RESP PANEL BY RT-PCR (FLU A&B, COVID) ARPGX2
Influenza A by PCR: NEGATIVE
Influenza B by PCR: NEGATIVE
SARS Coronavirus 2 by RT PCR: NEGATIVE

## 2022-04-11 LAB — HEMOGLOBIN A1C
Hgb A1c MFr Bld: 4.8 % (ref 4.8–5.6)
Mean Plasma Glucose: 91.06 mg/dL

## 2022-04-11 LAB — ETHANOL: Alcohol, Ethyl (B): <10 mg/dL (ref <10)

## 2022-04-11 LAB — TSH: TSH: 1.902 u[IU]/mL (ref 0.350–4.500)

## 2022-04-11 MED ORDER — OLANZAPINE 5 MG PO TBDP: 5.0000 mg | ORAL_TABLET | Freq: Three times a day (TID) | ORAL | Status: DC | PRN

## 2022-04-11 MED ORDER — ALUM & MAG HYDROXIDE-SIMETH 200-200-20 MG/5ML PO SUSP: 30.0000 mL | ORAL | Status: DC | PRN

## 2022-04-11 MED ORDER — HYDROXYZINE HCL 25 MG PO TABS: 25.0000 mg | ORAL_TABLET | Freq: Three times a day (TID) | ORAL | Status: DC | PRN

## 2022-04-11 MED ORDER — MAGNESIUM HYDROXIDE 400 MG/5ML PO SUSP: 30.0000 mL | Freq: Every day | ORAL | Status: DC | PRN

## 2022-04-11 MED ORDER — OLANZAPINE 5 MG PO TBDP
5.0000 mg | ORAL_TABLET | Freq: Three times a day (TID) | ORAL | Status: DC | PRN
  Administered 2022-04-11 – 2022-04-16 (×3): 5 mg via ORAL
  Filled 2022-04-11 (×3): qty 1

## 2022-04-11 MED ORDER — ACETAMINOPHEN 325 MG PO TABS
650.0000 mg | ORAL_TABLET | Freq: Four times a day (QID) | ORAL | Status: DC | PRN
  Administered 2022-04-26: 650 mg via ORAL
  Filled 2022-04-11: qty 2

## 2022-04-11 MED ORDER — MIRTAZAPINE 7.5 MG PO TABS
7.5000 mg | ORAL_TABLET | Freq: Every day | ORAL | Status: DC
  Administered 2022-04-11 – 2022-04-14 (×4): 7.5 mg via ORAL
  Filled 2022-04-11 (×7): qty 1

## 2022-04-11 MED ORDER — LACTATED RINGERS IV SOLN: INTRAVENOUS | Status: DC

## 2022-04-11 MED ORDER — MAGNESIUM HYDROXIDE 400 MG/5ML PO SUSP
30.0000 mL | Freq: Every day | ORAL | Status: DC | PRN
  Administered 2022-04-26: 30 mL via ORAL
  Filled 2022-04-11: qty 30

## 2022-04-11 MED ORDER — LORAZEPAM 1 MG PO TABS
1.0000 mg | ORAL_TABLET | Freq: Once | ORAL | Status: AC
  Administered 2022-04-11: 1 mg via ORAL

## 2022-04-11 MED ORDER — ZIPRASIDONE MESYLATE 20 MG IM SOLR: 20.0000 mg | INTRAMUSCULAR | Status: DC | PRN

## 2022-04-11 MED ORDER — ALUM & MAG HYDROXIDE-SIMETH 200-200-20 MG/5ML PO SUSP
30.0000 mL | ORAL | Status: DC | PRN
  Administered 2022-04-22 – 2022-04-26 (×3): 30 mL via ORAL
  Filled 2022-04-11 (×3): qty 30

## 2022-04-11 MED ORDER — LORAZEPAM 1 MG PO TABS
1.0000 mg | ORAL_TABLET | ORAL | Status: AC | PRN
  Administered 2022-04-17: 1 mg via ORAL
  Filled 2022-04-11: qty 1

## 2022-04-11 MED ORDER — LORAZEPAM 1 MG PO TABS: 1.0000 mg | ORAL_TABLET | ORAL | Status: DC | PRN

## 2022-04-11 MED ORDER — ENSURE ENLIVE PO LIQD
237.0000 mL | Freq: Two times a day (BID) | ORAL | Status: DC
  Administered 2022-04-11: 237 mL via ORAL
  Filled 2022-04-11 (×2): qty 237

## 2022-04-11 MED ORDER — MIRTAZAPINE 7.5 MG PO TABS: 7.5000 mg | ORAL_TABLET | Freq: Every day | ORAL | Status: DC

## 2022-04-11 MED ORDER — ENSURE ENLIVE PO LIQD
237.0000 mL | Freq: Two times a day (BID) | ORAL | Status: DC
  Administered 2022-04-12 – 2022-04-28 (×24): 237 mL via ORAL
  Filled 2022-04-11 (×35): qty 237

## 2022-04-11 MED ORDER — ZIPRASIDONE MESYLATE 20 MG IM SOLR
20.0000 mg | INTRAMUSCULAR | Status: AC | PRN
  Administered 2022-04-17: 20 mg via INTRAMUSCULAR
  Filled 2022-04-11: qty 20

## 2022-04-11 MED ORDER — HYDROXYZINE HCL 10 MG PO TABS
10.0000 mg | ORAL_TABLET | Freq: Three times a day (TID) | ORAL | Status: DC | PRN
  Administered 2022-04-11 – 2022-04-14 (×7): 10 mg via ORAL
  Filled 2022-04-11 (×7): qty 1

## 2022-04-11 MED ORDER — ACETAMINOPHEN 325 MG PO TABS: 650.0000 mg | ORAL_TABLET | Freq: Four times a day (QID) | ORAL | Status: DC | PRN

## 2022-04-11 NOTE — Progress Notes (Signed)
Admission note: Patient is a 56 year old female admitted from [facility] under IVC status due to suicidal ideation with the plan to run into traffic. Patient arrived to the [facility] unit at [id number] with police escort. Patient is alert and oriented on arrival to the unit, she responds to a lot of questions with yes or no.  Patient presents with flat affect, denies HI/VH, but endorses passive SI and auditory hallucination. According to the report received from [facility] nurse, Patient husband died 3 months ago from unintentional overdose, which led her into deep depression,  and she has been refusing to eat. Patient states she has lost 25 lb from not eating. Patient states she has not moved her bowel for 5 days, when asked the reason for her constipation, Patient rudely states "I have not been eating."  Patient covid test was negative, UDS was positive for benzo due to prescribed xanax she takes on a regular basis.  Pt  orientation to unit, room and routine Information packet given to patient and safety information discussed with her.  Admission INP armband ID verified with patient, and in place. fall risk assessment completed with Patient and she verbalized understanding of risks associated with falls. No contraband found during skin assessment, Skin, clean-dry- intact without evidence of bruising, or skin tears and tracks marks. Staff will continue to provide support and reassurance to patient.

## 2022-04-11 NOTE — ED Notes (Signed)
Discharged

## 2022-04-11 NOTE — Plan of Care (Signed)
  Problem: Coping: Goal: Coping ability will improve Outcome: Progressing Goal: Will verbalize feelings Outcome: Progressing   

## 2022-04-11 NOTE — ED Notes (Signed)
Pt arrived on unit, given lunch, contracts verbally for safety, oriented to unit, cooperative with admissions process.

## 2022-04-11 NOTE — BH Assessment (Signed)
Comprehensive Clinical Assessment (CCA) Note  04/11/2022 Marie Diaz [id number] DISPOSITION: Truman Hayward NP recommends an inpatient admission to assist with stabilization as bed placement is investigated.   [place] ED from 04/11/2022 in [facility] ED from 03/16/2022 in [place] ED from 02/10/2022 in [place] High Risk No Risk No Risk      The patient demonstrates the following risk factors for suicide: Chronic risk factors for suicide include: psychiatric disorder of depression  . Acute risk factors for suicide include: loss (financial, interpersonal, professional). Protective factors for this patient include: positive social support. Considering these factors, the overall suicide risk at this point appears to be high. Patient is not appropriate for outpatient follow up.   Patient presents as a voluntary walk in to [facility] brought in by her daughter Marie Diaz [phone]. Patient reports ongoing S/I for the last two days with a plan to run into traffic. Patient denies any H/I. Patient does report ongoing [place] stating that she "hears voices talking that aren't there," patient is vague in reference to content although denies that the voices are command in nature. Patient denies any VH. Patient denies any previous attempts/gestures to self harm or a history of inpatient admissions associated with S/I. Patient denies having a current mental health OP provider although patient has been diagnosed by her PCP (Dr. Georgina Quint) with MDD/GAD who prescribes Xanax (See MAR) for PRN use. Patient states she is prescribed 1 mg TID although reports she "only takes them once in a while" with last use last night (10/12) when patient reported she took 1 mg prior to bedtime. Patient reports she has only been sleeping 3 to 4 hours a night for the last month. Patient reports her depression has worsened in the last month with  symptoms to include feeling hopeless, isolates with decreased appetite  (patient states she has lost 10 pounds in the last month) since her husband passed away of a unintentional drug overdose 3 months ago. Patient states this date that she "just can't deal with the pain any longer" reporting above mentioned S/I. Patient states she resides with her daughter Marie Diaz) who assists with ongoing care. Daughter reports she "has never seen her mother like this before" stating for the last month her mother has "just not been right" reporting patient has been isolating, not talking and appears to be responding to internal stimuli at times (talking to herself). Patient reports she was married for 33 years and is now "totally lost" after his death. Patient renders limited history this date and is observed to be unaware of this writer's presence at times during the assessment. Patient speaks in a low soft voice and is slow to respond to questions. Patient was seen by Truman Hayward NP who recommended an inpatient admission to assist with stabilization. Provider also initiated an IVC due to patient's presentation.   Patient is oriented x 4. Patient speaks in a low soft voice that is difficult to understand at times. Patient is slow to respond to this writer's questions and appears to unaware of this writer's presence at times during triage. Patient renders limited history. Patient's memory appears to be partially impaired with thoughts somewhat disorganized. Patient's affect is depressed/flat with affect congruent. Patient does not appear to be responding to internal stimuli.             Chief Complaint:  Chief Complaint  Patient presents with   Depression   Visit  Diagnosis: MDD recurrent with psychotic features, severe, GAD    CCA Screening, Triage and Referral (STR)  Patient Reported Information How did you hear about [place]? Self  What Is the Reason for Your Visit/Call Today? Pt Marie Diaz presents to [facility] accompanied  by her daughter due to worsening depression symptoms. Pt states her husband died 3 months ago and she has been experiencing SI with a plan to walk in front of a car. Pt reports weight loss and loss of appetite. She reports auditory hallucinations yesterday, hearing a voice telling her to kill herself. Pt denies HI, NSSIB, drug or alcohol use and AVH. EMERGENT  How Long Has This Been Causing You Problems? 1 wk - 1 month  What Do You Feel Would Help You the Most Today? Treatment for Depression or other mood problem   Have You Recently Had Any Thoughts About Hurting Yourself? Yes  Are You Planning to Commit Suicide/Harm Yourself At This time? Yes   Have you Recently Had Thoughts About Hurting Someone [name]? No  Are You Planning to Harm Someone at This Time? No  Explanation: No data recorded  Have You Used Any Alcohol or Drugs in the Past 24 Hours? No  How Long Ago Did You Use Drugs or Alcohol? No data recorded What Did You Use and How Much? No data recorded  Do You Currently Have a Therapist/Psychiatrist? No  Name of Therapist/Psychiatrist: No data recorded  Have You Been Recently Discharged From Any Office Practice or Programs? No  Explanation of Discharge From Practice/Program: No data recorded    CCA Screening Triage Referral Assessment Type of Contact: Face-to-Face  Telemedicine Service Delivery:   Is this Initial or Reassessment? No data recorded Date Telepsych consult ordered in [facility]:  No data recorded Time Telepsych consult ordered in [facility]:  No data recorded Location of Assessment: [facility] Assessment Services  Provider Location: [facility] Assessment Services   Collateral Involvement: Daughter who is present Aava Deland [phone]   Does Patient Have a Court Appointed Legal Guardian? No  Legal Guardian Contact Information: No data recorded Copy of Legal Guardianship Form: No data recorded Legal Guardian Notified of Arrival: No data recorded Legal Guardian Notified  of Pending Discharge: No data recorded If Minor and Not Living with Parent(s), Who has Custody? NA  Is CPS involved or ever been involved? Never  Is APS involved or ever been involved? Never   Patient Determined To Be At Risk for Harm To Self or Others Based on Review of Patient Reported Information or Presenting Complaint? Yes, for Self-Harm  Method: No data recorded Availability of Means: No data recorded Intent: No data recorded Notification Required: No data recorded Additional Information for Danger to Others Potential: No data recorded Additional Comments for Danger to Others Potential: No data recorded Are There Guns or Other Weapons in Your Home? No data recorded Types of Guns/Weapons: No data recorded Are These Weapons Safely Secured?                            No data recorded Who Could Verify You Are Able To Have These Secured: No data recorded Do You Have any Outstanding Charges, Pending Court Dates, Parole/Probation? No data recorded Contacted To Inform of Risk of Harm To Self or Others: Other: Comment (NA)    Does Patient Present under Involuntary Commitment? No  IVC Papers Initial File Date: No data recorded  [place] of Residence: [place]   Patient Currently Receiving  the Following Services: Not Receiving Services   Determination of Need: Emergent (2 hours)   Options For Referral: Inpatient Hospitalization     CCA Biopsychosocial Patient Reported Schizophrenia/Schizoaffective Diagnosis in Past: No   Strengths: Patient is willing to participate in treatment and open to treatment interventions to include counseling, an inpatient admission to assist with stabalization and medication managment.   Mental Health Symptoms Depression:   Change in energy/activity; Difficulty Concentrating; Fatigue; Hopelessness; Increase/decrease in appetite; Sleep (too much or little)   Duration of Depressive symptoms:  Duration of Depressive Symptoms: Greater than two  weeks   Mania:   None   Anxiety:    Difficulty concentrating; Irritability; Worrying   Psychosis:   Hallucinations   Duration of Psychotic symptoms:  Duration of Psychotic Symptoms: Less than six months   Trauma:   None   Obsessions:   None   Compulsions:   None   Inattention:   None   Hyperactivity/Impulsivity:   None   Oppositional/Defiant Behaviors:   None   Emotional Irregularity:   Chronic feelings of emptiness; Mood lability   Other Mood/Personality Symptoms:   NA    Mental Status Exam Appearance and self-care  Stature:   Small   Weight:   Underweight   Clothing:   Disheveled   Grooming:   Neglected   Cosmetic use:   None   Posture/gait:   Stooped; Slumped   Motor activity:   Restless   Sensorium  Attention:   Distractible   Concentration:   Anxiety interferes   Orientation:   X5   Recall/memory:   Normal   Affect and Mood  Affect:   Anxious   Mood:   Depressed; Anxious   Relating  Eye contact:   Fleeting   Facial expression:   Depressed   Attitude toward examiner:   Cooperative   Thought and Language  Speech flow:  Soft; Slow   Thought content:   Appropriate to Mood and Circumstances   Preoccupation:   Suicide   Hallucinations:   Auditory; Visual   Organization:  No data recorded  [employer] of Knowledge:   Fair   Intelligence:   Needs investigation   Abstraction:   Functional   Judgement:   Fair   Reality Testing:   Realistic   Insight:   Fair   Decision Making:   Only simple   Social Functioning  Social Maturity:   Responsible   Social Judgement:   Normal   Stress  Stressors:   Grief/losses   Coping Ability:   Human resources officer Deficits:   Activities of daily living   Supports:   Family     Religion: Religion/Spirituality Are You A Religious Person?: Yes What is Your Religious Affiliation?: Christian How Might This Affect Treatment?: Pt  states she is strong in her faith  Leisure/Recreation: Leisure / Recreation Do You Have Hobbies?: No  Exercise/Diet: Exercise/Diet Do You Exercise?: No Have You Gained or Lost A Significant Amount of Weight in the Past Six Months?: No Do You Follow a Special Diet?: No Do You Have Any Trouble Sleeping?: Yes Explanation of Sleeping Difficulties: pt states she only sleeps 3 to 4 hours a night for the last month   CCA Employment/Education Employment/Work Situation: Employment / Work Situation Employment Situation: Unemployed Patient's Job has Been Impacted by Current Illness: No Has Patient ever Been in Equities trader?: No  Education: Education Is Patient Currently Attending School?: No Last Grade Completed: 12 Did You Attend  College?: No Did You Have An Individualized Education Program (IIEP): No Did You Have Any Difficulty At School?: No Patient's Education Has Been Impacted by Current Illness: No   CCA Family/Childhood History Family and Relationship History: Family history Marital status: Widowed Widowed, when?: 3 months ago Does patient have children?: Yes How many children?: 2 How is patient's relationship with their children?: Pt resides with both adult children and reports they are very supportive  Childhood History:  Childhood History By whom was/is the patient raised?: Both parents Did patient suffer any verbal/emotional/physical/sexual abuse as a child?: No Did patient suffer from severe childhood neglect?: No Has patient ever been sexually abused/assaulted/raped as an adolescent or adult?: No Was the patient ever a victim of a crime or a disaster?: No Witnessed domestic violence?: No Has patient been affected by domestic violence as an adult?: No  Child/Adolescent Assessment:     CCA Substance Use Alcohol/Drug Use: Alcohol / Drug Use Pain Medications: See MAR Prescriptions: See MAR Over the Counter: See MAR History of alcohol / drug use?: No history  of alcohol / drug abuse                         ASAM's:  Six Dimensions of Multidimensional Assessment  Dimension 1:  Acute Intoxication and/or Withdrawal Potential:      Dimension 2:  Biomedical Conditions and Complications:      Dimension 3:  Emotional, Behavioral, or Cognitive Conditions and Complications:     Dimension 4:  Readiness to Change:     Dimension 5:  Relapse, Continued use, or Continued Problem Potential:     Dimension 6:  Recovery/Living Environment:     ASAM Severity Score:    ASAM Recommended Level of Treatment:     Substance use Disorder (SUD)    Recommendations for Services/Supports/Treatments:    Discharge Disposition:    DSM5 Diagnoses: Patient Active Problem List   Diagnosis Date Noted   N&V (nausea and vomiting) 03/04/2022   Abdominal pain 03/11/2021   Vomiting 03/11/2021   Diarrhea 03/11/2021   Essential hypertension 03/11/2021   Gastritis 03/11/2021   Abdominal pain, epigastric 02/19/2021   Dyspepsia 08/17/2019   Numbness of right foot 08/23/2018   Right leg weakness 08/23/2018     Referrals to Alternative Service(s): Referred to Alternative Service(s):   Place:   Date:   Time:    Referred to Alternative Service(s):   Place:   Date:   Time:    Referred to Alternative Service(s):   Place:   Date:   Time:    Referred to Alternative Service(s):   Place:   Date:   Time:     Alfredia Ferguson, LCAS

## 2022-04-11 NOTE — ED Notes (Signed)
Report called to [employer], Therapist, sports at [facility] [employer] notified of need for transport to [facility].

## 2022-04-11 NOTE — ED Notes (Signed)
Discharged with all belongings with [facility] PD. Patient is going to [facility].

## 2022-04-11 NOTE — ED Provider Notes (Cosign Needed Addendum)
[facility] Continuous [facility] H&P  Date: 04/11/22 Patient Name: Marie Diaz MRN: [id number] Chief Complaint: "I want to kill myself". Chief Complaint  Patient presents with   Depression     Diagnoses:  Final diagnoses:  Depression, unspecified depression type  Suicidal ideation   HPI:   [facility].  Pt is accompanied by her daughter, Ladona Ridgelaylor. Pt is assessed face-to-face by nurse practitioner and Dr. Cyndie ChimeNguyen.   Marie Diaz, 56 y.o., female patient seen face to face by this provider, and chart reviewed on 04/11/22.  On evaluation Marie Diaz reports presenting to this facility today due to suicidal thoughts, "I want to kill myself". She reports experiencing SI with plan to run in front of a car with intent for the past week. Pt denies current AH. She reports experiencing CAH to "kill yourself" and telling her to run in front of a car for the past 2 weeks. She describes CAH as an unknown female voice. She denies VH. Pt reports paranoia although when asked about this states "I don't know" and does not elaborate. Pt reports poor appetite, with 15 lb weight loss in a month. She reports poor appetite at baseline, although worsening recently. Pt endorses poor sleep, sleeping about 6 hours/night, although having difficulty falling and staying asleep.  Pt denies HI/VI.   Pt denies history of NSSI, SA, or inpatient psychiatric hospitalization.  Pt reports smoking 1 pack of cigarettes/day "for years". She denies alcohol, marijuana, crack/cocaine, other illicit drug use. She states she is prescribed Xanax.   Pt reports daily medications include Xanax for anxiety and Clonidine for hypertension. Xanax is prescribed as 1mg  TID, although pt is not taking it that way. Pt states she uses it on a less than daily basis. Pt last took Xanax and Clonidine last night.   Pt states she is living with her 57 y/o son.  Pt states her son and her daughter are strong support for her.  Pt reports access to a firearm that used to be her husband's. Her husband passed away 3 months ago. Pt reports firearm is locked, unloaded, and ammunition is locked separately from the firearm.,  Pt denies knowledge of family psychiatric history.  Pt reports she is not currently employed. Highest level of education is the 11th grade.   Pt gives verbal consent for her daughter Ladona Ridgelaylor to join the session. Ladona Ridgelaylor states she feels pt has "given up". She states 3 days ago pt attempted to run out of the house to try to run in front of a car and was stopped by her brother. Per Ladona Ridgelaylor, pt's appetite is poor at baseline, although has been worse in the past week. Ladona Ridgelaylor states pt has chronic abd pain although has been denying any pain for the past month. Ladona Ridgelaylor states pt is prescribed Xanax TID although takes less than that a day. Ladona Ridgelaylor is concerned pt is a danger to herself.  During evaluation Marie Diaz is a&ox3. She appears anxious and tremorous. She appears older than stated age. She is ill appearing. She is cooperative, although withdrawn. She makes minimal eye contact. Her speech is clear and coherent, although hesitant, with decreased volume. Her reported mood is anxious, depressed, dysphoric. Her affect is congruent, flat. TP is coherent. Description of associations intact. TC is logical. She does not appear to be actively responding to internal stimuli. There is no evidence of agitation or aggression.   Discussed with pt  and Ladona Ridgel recommendation for inpatient psychiatric admission.  EKGs discussed with EDP Dr. Theresia Lo. Likely rate related ST depression. No acute concerns as pt is asymptomatic.  PHQ 2-9:   Flowsheet Row ED from 04/11/2022 in [facility] ED from 03/16/2022 in [name] ED from 02/10/2022 in [facility] EMERGENCY DEPART[facility]  C-SSRS RISK CATEGORY High Risk No Risk No  Risk        Total Time spent with patient: 30 minutes  Musculoskeletal  Strength & Muscle Tone: within normal limits Gait & Station: normal Patient leans: N/A  Psychiatric Specialty Exam  Presentation General Appearance:  Other (comment) (dressed in casual clothes, with a bath robe on top)  Eye Contact: Minimal  Speech: Clear and Coherent; Other (comment) (hesitant speech)  Speech Volume: Decreased  Handedness:No data recorded  Mood and Affect  Mood: Anxious; Depressed; Dysphoric  Affect: Congruent; Flat   Thought Process  Thought Processes: Coherent  Descriptions of Associations:Intact  Orientation:Full (Time, Place and Person)  Thought Content:Logical  Diagnosis of Schizophrenia or Schizoaffective disorder in past: No  Duration of Psychotic Symptoms: Less than six months  Hallucinations:Hallucinations: None; Other (comment) (None currently, although reports experiencing CAH to "kill yourself" and telling her to run in front of a car for the past 2 weeks)  Ideas of Reference:Paranoia; Other (comment) (Pt reports paranoia although when asked about this states "I don't know")  Suicidal Thoughts:Suicidal Thoughts: Yes, Active SI Active Intent and/or Plan: With Plan; With Intent  Homicidal Thoughts:Homicidal Thoughts: No   Sensorium  Memory: Immediate Fair; Recent Fair  Judgment: Fair  Insight: Present   Executive Functions  Concentration: Fair  Attention Span: Fair  Recall: [employer] of Knowledge: Fair  Language: Fair   Psychomotor Activity  Psychomotor Activity: Psychomotor Activity: Other (comment) (tremorous)   Assets  Assets: Communication Skills; Desire for Improvement; Housing; Resilience; Social Support   Sleep  Sleep: Sleep: Poor   No data recorded  Physical Exam Cardiovascular:     Rate and Rhythm: Normal rate.  Pulmonary:     Effort: Pulmonary effort is normal.  Skin:    General: Skin is warm and  dry.  Neurological:     Mental Status: She is alert and oriented to person, place, and time.  Psychiatric:        Attention and Perception: Attention and perception normal.        Mood and Affect: Mood is anxious and depressed. Affect is flat.        Speech: Speech is delayed.        Behavior: Behavior is withdrawn. Behavior is cooperative.        Thought Content: Thought content is paranoid. Thought content includes suicidal ideation. Thought content includes suicidal plan.    Review of Systems  Constitutional:  Negative for chills and fever.  Respiratory:  Negative for shortness of breath.   Cardiovascular:  Negative for chest pain and palpitations.  Gastrointestinal:  Negative for abdominal pain.  Neurological:  Negative for dizziness and headaches.  Psychiatric/Behavioral:  Positive for depression and suicidal ideas. The patient is nervous/anxious.     Blood pressure 118/62, pulse 100, temperature 98.7 F (37.1 C), resp. rate 18, SpO2 99 %. There is no height or weight on file to calculate BMI.  Past Psychiatric History: Pt reports chronic anxiety, depression   Is the patient at risk to self? Yes  Has the patient been a risk to self in the past 6 months? No .  Has the patient been a risk to self within the distant past? No   Is the patient a risk to others? No   Has the patient been a risk to others in the past 6 months? No   Has the patient been a risk to others within the distant past? No   Past Medical History:  Past Medical History:  Diagnosis Date   Anxiety    Depression    Gastric ulcer    Gastritis    Hypertension     Past Surgical History:  Procedure Laterality Date   ABDOMINAL HYSTERECTOMY     BIOPSY  08/29/2019   Procedure: BIOPSY;  Surgeon: West Bali, MD;  Location: [facility];  Service: Endoscopy;;   BIOPSY  02/26/2021   Procedure: BIOPSY;  Surgeon: Lanelle Bal, DO;  Location: [facility];  Service: Endoscopy;;   CHOLECYSTECTOMY      ESOPHAGOGASTRODUODENOSCOPY N/A 08/29/2019   normal esophagus, localized moderate inflammation with adherent blood, congestion, erosions, and friability on greater curvature of stomach. Biopsy with chronic gastritis, negative H.pylori.    ESOPHAGOGASTRODUODENOSCOPY (EGD) WITH PROPOFOL N/A 02/26/2021   gastritis and pathology with reactive gastropathy and negative H.pylori   HEMORRHOID SURGERY      Family History:  Family History  Problem Relation Age of Onset   Colon cancer Neg Hx    Colon polyps Neg Hx     Social History:  Social History   Socioeconomic History   Marital status: Married    Spouse name: Not on file   Number of children: Not on file   Years of education: Not on file   Highest education level: Not on file  Occupational History   Not on file  Tobacco Use   Smoking status: Every Day    Packs/day: 0.50    Types: Cigarettes   Smokeless tobacco: Never  Vaping Use   Vaping Use: Former  Substance and Sexual Activity   Alcohol use: No   Drug use: No   Sexual activity: Yes  Other Topics Concern   Not on file  Social History Narrative   [phone]), kids(2). 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 SHE'S AN ONLY CHILD.   Social Determinants of Health   Financial Resource Strain: Not on file  Food Insecurity: Not on file  Transportation Needs: Not on file  Physical Activity: Not on file  Stress: Not on file  Social Connections: Not on file  Intimate Partner Violence: Not on file    SDOH:  SDOH Screenings   Tobacco Use: High Risk (04/11/2022)    Last Labs:  Admission on 04/11/2022  Component Date Value Ref Range Status   SARS Coronavirus 2 by RT PCR 04/11/2022 NEGATIVE  NEGATIVE Final   Comment: (NOTE) SARS-CoV-2 target nucleic acids are NOT DETECTED.  The SARS-CoV-2 RNA is generally detectable in upper respiratory specimens during the acute phase of infection. The lowest concentration of SARS-CoV-2  viral copies this assay can detect is 138 copies/mL. A negative result does not preclude SARS-Cov-2 infection and should not be used as the sole basis for treatment or other patient management decisions. A negative result may occur with  improper specimen collection/handling, submission of specimen other than nasopharyngeal swab, presence of viral mutation(s) within the areas targeted by this assay, and inadequate number of viral copies(<138 copies/mL). A negative result must be combined with clinical observations, patient history, and epidemiological information. The expected result is Negative.  Fact Sheet for Patients:  BloggerCourse.com  Fact Sheet for Healthcare Providers:  SeriousBroker.it  This test is no                          t yet approved or cleared by the [place] FDA and  has been authorized for detection and/or diagnosis of SARS-CoV-2 by FDA under an Emergency Use Authorization (EUA). This EUA will remain  in effect (meaning this test can be used) for the duration of the COVID-19 declaration under Section 564(b)(1) of the Act, 21 U.S.C.section 360bbb-3(b)(1), unless the authorization is terminated  or revoked sooner.       Influenza A by PCR 04/11/2022 NEGATIVE  NEGATIVE Final   Influenza B by PCR 04/11/2022 NEGATIVE  NEGATIVE Final   Comment: (NOTE) The Xpert Xpress SARS-CoV-2/FLU/RSV plus assay is intended as an aid in the diagnosis of influenza from Nasopharyngeal swab specimens and should not be used as a sole basis for treatment. Nasal washings and aspirates are unacceptable for Xpert Xpress SARS-CoV-2/FLU/RSV testing.  Fact Sheet for Patients: BloggerCourse.com  Fact Sheet for Healthcare Providers: SeriousBroker.it  This test is not yet approved or cleared by the [place] FDA and has been authorized for detection and/or diagnosis of SARS-CoV-2  by FDA under an Emergency Use Authorization (EUA). This EUA will remain in effect (meaning this test can be used) for the duration of the COVID-19 declaration under Section 564(b)(1) of the Act, 21 U.S.C. section 360bbb-3(b)(1), unless the authorization is terminated or revoked.  Performed at [facility], [address], [place], [place] [address]    WBC 04/11/2022 11.8 (H)  4.0 - 10.5 K/uL Final   RBC 04/11/2022 5.27 (H)  3.87 - 5.11 MIL/uL Final   Hemoglobin 04/11/2022 16.7 (H)  12.0 - 15.0 g/dL Final   HCT 91/47/8295 45.8  36.0 - 46.0 % Final   MCV 04/11/2022 86.9  80.0 - 100.0 fL Final   MCH 04/11/2022 31.7  26.0 - 34.0 pg Final   MCHC 04/11/2022 36.5 (H)  30.0 - 36.0 g/dL Final   RDW 62/13/0865 13.0  11.5 - 15.5 % Final   Platelets 04/11/2022 291  150 - 400 K/uL Final   nRBC 04/11/2022 0.0  0.0 - 0.2 % Final   Neutrophils Relative % 04/11/2022 72  % Final   Neutro Abs 04/11/2022 8.4 (H)  1.7 - 7.7 K/uL Final   Lymphocytes Relative 04/11/2022 18  % Final   Lymphs Abs 04/11/2022 2.2  0.7 - 4.0 K/uL Final   Monocytes Relative 04/11/2022 9  % Final   Monocytes Absolute 04/11/2022 1.1 (H)  0.1 - 1.0 K/uL Final   Eosinophils Relative 04/11/2022 0  % Final   Eosinophils Absolute 04/11/2022 0.0  0.0 - 0.5 K/uL Final   Basophils Relative 04/11/2022 1  % Final   Basophils Absolute 04/11/2022 0.1  0.0 - 0.1 K/uL Final   Immature Granulocytes 04/11/2022 0  % Final   Abs Immature Granulocytes 04/11/2022 0.05  0.00 - 0.07 K/uL Final   Performed at [facility], [address], [place], [place] [address]   Sodium 04/11/2022 143  135 - 145 mmol/L Final   Potassium 04/11/2022 3.0 (L)  3.5 - 5.1 mmol/L Final   Chloride 04/11/2022 107  98 - 111 mmol/L Final   CO2 04/11/2022 24  22 - 32 mmol/L Final   Glucose, Bld 04/11/2022 110 (H)  70 - 99 mg/dL Final   Glucose  reference range applies only to samples taken after fasting for at least 8 hours.   BUN 04/11/2022 <5 (L)  6 - 20  mg/dL Final   Creatinine, Ser 04/11/2022 0.64  0.44 - 1.00 mg/dL Final   Calcium 16/03/9603 9.4  8.9 - 10.3 mg/dL Final   Total Protein 54/02/8118 6.4 (L)  6.5 - 8.1 g/dL Final   Albumin 14/78/2956 4.0  3.5 - 5.0 g/dL Final   AST 21/30/8657 15  15 - 41 U/L Final   ALT 04/11/2022 15  0 - 44 U/L Final   Alkaline Phosphatase 04/11/2022 69  38 - 126 U/L Final   Total Bilirubin 04/11/2022 0.5  0.3 - 1.2 mg/dL Final   GFR, Estimated 04/11/2022 >60  >60 mL/min Final   Comment: (NOTE) Calculated using the CKD-EPI Creatinine Equation (2021)    Anion gap 04/11/2022 12  5 - 15 Final   Performed at [facility], [address], [place], [place] [address]   Hgb A1c MFr Bld 04/11/2022 4.8  4.8 - 5.6 % Final   Comment: (NOTE) Pre diabetes:          5.7%-6.4%  Diabetes:              >6.4%  Glycemic control for   <7.0% adults with diabetes    Mean Plasma Glucose 04/11/2022 91.06  mg/dL Final   Performed at [facility], [address], [place], [place] [address]   Alcohol, Ethyl (B) 04/11/2022 <10  <10 mg/dL Final   Comment: (NOTE) Lowest detectable limit for serum alcohol is 10 mg/dL.  For medical purposes only. Performed at [facility], [address] [place], [place] [address]    Cholesterol 04/11/2022 177  0 - 200 mg/dL Final   Triglycerides 40/03/2724 193 (H)  <150 mg/dL Final   HDL 36/64/4034 41  >40 mg/dL Final   Total CHOL/HDL Ratio 04/11/2022 4.3  RATIO Final   VLDL 04/11/2022 39  0 - 40 mg/dL Final   LDL Cholesterol 04/11/2022 97  0 - 99 mg/dL Final   Comment:        Total Cholesterol/HDL:CHD Risk Coronary Heart Disease Risk Table                     Men   Women  1/2 Average Risk   3.4   3.3  Average Risk       5.0   4.4  2 X Average Risk   9.6   7.1  3 X Average Risk  23.4   11.0        Use the calculated Patient Ratio above and the CHD Risk Table to determine the patient's CHD Risk.        ATP III CLASSIFICATION (LDL):  <100     mg/dL   Optimal   742-595  mg/dL   Near or Above                    Optimal  130-159  mg/dL   Borderline  638-756  mg/dL   High  >433     mg/dL   Very High Performed at [facility], [address] [place], [place] [address]    TSH 04/11/2022 1.902  0.350 - 4.500 uIU/mL Final   Comment: Performed by a 3rd Generation assay with a functional sensitivity of <=0.01 uIU/mL. Performed at [facility], [address], [place], [place] [address]    POC  Amphetamine UR 04/11/2022 None Detected  NONE DETECTED (Cut Off Level 1000 ng/mL) Final   POC Secobarbital (BAR) 04/11/2022 None Detected  NONE DETECTED (Cut Off Level 300 ng/mL) Final   POC Buprenorphine (BUP) 04/11/2022 None Detected  NONE DETECTED (Cut Off Level 10 ng/mL) Final   POC Oxazepam (BZO) 04/11/2022 Positive (A)  NONE DETECTED (Cut Off Level 300 ng/mL) Final   POC Cocaine UR 04/11/2022 None Detected  NONE DETECTED (Cut Off Level 300 ng/mL) Final   POC Methamphetamine UR 04/11/2022 None Detected  NONE DETECTED (Cut Off Level 1000 ng/mL) Final   POC Morphine 04/11/2022 None Detected  NONE DETECTED (Cut Off Level 300 ng/mL) Final   POC Methadone UR 04/11/2022 None Detected  NONE DETECTED (Cut Off Level 300 ng/mL) Final   POC Oxycodone UR 04/11/2022 None Detected  NONE DETECTED (Cut Off Level 100 ng/mL) Final   POC Marijuana UR 04/11/2022 None Detected  NONE DETECTED (Cut Off Level 50 ng/mL) Final  Admission on 03/16/2022, Discharged on 03/16/2022  Component Date Value Ref Range Status   Lipase 03/16/2022 32  11 - 51 U/L Final   Performed at [facility], [address], [place], [place] [address]   Sodium 03/16/2022 140  135 - 145 mmol/L Final   Potassium 03/16/2022 3.3 (L)  3.5 - 5.1 mmol/L Final   Chloride 03/16/2022 107  98 - 111 mmol/L Final   CO2 03/16/2022 22  22 - 32 mmol/L Final   Glucose, Bld 03/16/2022 126 (H)  70 - 99 mg/dL Final   Glucose reference range applies only to samples taken after fasting for at least 8 hours.   BUN  03/16/2022 17  6 - 20 mg/dL Final   Creatinine, Ser 03/16/2022 0.64  0.44 - 1.00 mg/dL Final   Calcium 90/21/1155 8.9  8.9 - 10.3 mg/dL Final   Total Protein 20/80/2233 7.3  6.5 - 8.1 g/dL Final   Albumin 61/22/4497 4.4  3.5 - 5.0 g/dL Final   AST 53/00/5110 38  15 - 41 U/L Final   ALT 03/16/2022 47 (H)  0 - 44 U/L Final   Alkaline Phosphatase 03/16/2022 58  38 - 126 U/L Final   Total Bilirubin 03/16/2022 1.0  0.3 - 1.2 mg/dL Final   GFR, Estimated 03/16/2022 >60  >60 mL/min Final   Comment: (NOTE) Calculated using the CKD-EPI Creatinine Equation (2021)    Anion gap 03/16/2022 11  5 - 15 Final   Performed at [facility], [address], [place], [place] [address]   WBC 03/16/2022 7.2  4.0 - 10.5 K/uL Final   RBC 03/16/2022 4.78  3.87 - 5.11 MIL/uL Final   Hemoglobin 03/16/2022 14.8  12.0 - 15.0 g/dL Final   HCT 35/67/0141 42.5  36.0 - 46.0 % Final   MCV 03/16/2022 88.9  80.0 - 100.0 fL Final   MCH 03/16/2022 31.0  26.0 - 34.0 pg Final   MCHC 03/16/2022 34.8  30.0 - 36.0 g/dL Final   RDW 08/28/3141 11.9  11.5 - 15.5 % Final   Platelets 03/16/2022 216  150 - 400 K/uL Final   nRBC 03/16/2022 0.0  0.0 - 0.2 % Final   Performed at [facility], [address] [place], [place] [address]   Fecal Occult Bld 03/16/2022 NEGATIVE  NEGATIVE Final  Hospital Outpatient Visit on 03/04/2022  Component Date Value Ref Range Status   WBC 03/04/2022 7.9  4.0 - 10.5 K/uL Final   RBC 03/04/2022 5.03  3.87 - 5.11 MIL/uL Final  Hemoglobin 03/04/2022 15.3 (H)  12.0 - 15.0 g/dL Final   HCT 95/62/1308 45.6  36.0 - 46.0 % Final   MCV 03/04/2022 90.7  80.0 - 100.0 fL Final   MCH 03/04/2022 30.4  26.0 - 34.0 pg Final   MCHC 03/04/2022 33.6  30.0 - 36.0 g/dL Final   RDW 65/78/4696 12.4  11.5 - 15.5 % Final   Platelets 03/04/2022 312  150 - 400 K/uL Final   nRBC 03/04/2022 0.0  0.0 - 0.2 % Final   Neutrophils Relative % 03/04/2022 64  % Final   Neutro Abs 03/04/2022 5.1  1.7 - 7.7 K/uL Final   Lymphocytes  Relative 03/04/2022 28  % Final   Lymphs Abs 03/04/2022 2.2  0.7 - 4.0 K/uL Final   Monocytes Relative 03/04/2022 6  % Final   Monocytes Absolute 03/04/2022 0.5  0.1 - 1.0 K/uL Final   Eosinophils Relative 03/04/2022 1  % Final   Eosinophils Absolute 03/04/2022 0.1  0.0 - 0.5 K/uL Final   Basophils Relative 03/04/2022 1  % Final   Basophils Absolute 03/04/2022 0.1  0.0 - 0.1 K/uL Final   Immature Granulocytes 03/04/2022 0  % Final   Abs Immature Granulocytes 03/04/2022 0.03  0.00 - 0.07 K/uL Final   Performed at [facility], [address] [place], [place] [address]   Sodium 03/04/2022 141  135 - 145 mmol/L Final   Potassium 03/04/2022 4.1  3.5 - 5.1 mmol/L Final   Chloride 03/04/2022 108  98 - 111 mmol/L Final   CO2 03/04/2022 24  22 - 32 mmol/L Final   Glucose, Bld 03/04/2022 104 (H)  70 - 99 mg/dL Final   Glucose reference range applies only to samples taken after fasting for at least 8 hours.   BUN 03/04/2022 15  6 - 20 mg/dL Final   Creatinine, Ser 03/04/2022 0.82  0.44 - 1.00 mg/dL Final   Calcium 41/32/4401 9.3  8.9 - 10.3 mg/dL Final   Total Protein 02/72/5366 7.1  6.5 - 8.1 g/dL Final   Albumin 44/08/4740 4.3  3.5 - 5.0 g/dL Final   AST 59/56/3875 11 (L)  15 - 41 U/L Final   ALT 03/04/2022 10  0 - 44 U/L Final   Alkaline Phosphatase 03/04/2022 60  38 - 126 U/L Final   Total Bilirubin 03/04/2022 0.8  0.3 - 1.2 mg/dL Final   GFR, Estimated 03/04/2022 >60  >60 mL/min Final   Comment: (NOTE) Calculated using the CKD-EPI Creatinine Equation (2021)    Anion gap 03/04/2022 9  5 - 15 Final   Performed at [facility], [address] [place], [place] [address]   Lipase 03/04/2022 39  11 - 51 U/L Final   Performed at [facility], [address] [place], [place] [address]  Admission on 02/10/2022, Discharged on 02/10/2022  Component Date Value Ref Range Status   Lipase 02/10/2022 28  11 - 51 U/L Final   Performed at [facility], [address], [place], [place] [address]    Sodium 02/10/2022 142  135 - 145 mmol/L Final   Potassium 02/10/2022 4.1  3.5 - 5.1 mmol/L Final   Chloride 02/10/2022 108  98 - 111 mmol/L Final   CO2 02/10/2022 24  22 - 32 mmol/L Final   Glucose, Bld 02/10/2022 120 (H)  70 - 99 mg/dL Final   Glucose reference range applies only to samples taken after fasting for at least 8 hours.   BUN 02/10/2022 11  6 - 20 mg/dL  Final   Creatinine, Ser 02/10/2022 0.79  0.44 - 1.00 mg/dL Final   Calcium 16/03/9603 9.5  8.9 - 10.3 mg/dL Final   Total Protein 54/02/8118 7.8  6.5 - 8.1 g/dL Final   Albumin 14/78/2956 4.7  3.5 - 5.0 g/dL Final   AST 21/30/8657 17  15 - 41 U/L Final   ALT 02/10/2022 11  0 - 44 U/L Final   Alkaline Phosphatase 02/10/2022 64  38 - 126 U/L Final   Total Bilirubin 02/10/2022 0.4  0.3 - 1.2 mg/dL Final   GFR, Estimated 02/10/2022 >60  >60 mL/min Final   Comment: (NOTE) Calculated using the CKD-EPI Creatinine Equation (2021)    Anion gap 02/10/2022 10  5 - 15 Final   Performed at [facility], [address], [place], [place] [address]   WBC 02/10/2022 8.9  4.0 - 10.5 K/uL Final   RBC 02/10/2022 5.34 (H)  3.87 - 5.11 MIL/uL Final   Hemoglobin 02/10/2022 16.4 (H)  12.0 - 15.0 g/dL Final   HCT 29/52/8413 47.2 (H)  36.0 - 46.0 % Final   MCV 02/10/2022 88.4  80.0 - 100.0 fL Final   MCH 02/10/2022 30.7  26.0 - 34.0 pg Final   MCHC 02/10/2022 34.7  30.0 - 36.0 g/dL Final   RDW 24/40/1027 12.6  11.5 - 15.5 % Final   Platelets 02/10/2022 325  150 - 400 K/uL Final   nRBC 02/10/2022 0.0  0.0 - 0.2 % Final   Performed at [facility], [address] [place], [place] [address]   Magnesium 02/10/2022 2.2  1.7 - 2.4 mg/dL Final   Performed at [facility], [address] [place], [place] [address]   Troponin I (High Sensitivity) 02/10/2022 3  <18 ng/L Final   Comment: (NOTE) Elevated high sensitivity troponin I (hsTnI) values and significant  changes across serial measurements may suggest ACS but many other  chronic and acute  conditions are known to elevate hsTnI results.  Refer to the "Links" section for chest pain algorithms and additional  guidance. Performed at [facility], [address], [place], [place] [address]   Admission on 01/03/2022, Discharged on 01/03/2022  Component Date Value Ref Range Status   WBC 01/03/2022 8.1  4.0 - 10.5 K/uL Final   RBC 01/03/2022 4.98  3.87 - 5.11 MIL/uL Final   Hemoglobin 01/03/2022 15.5 (H)  12.0 - 15.0 g/dL Final   HCT 56/38/7564 44.0  36.0 - 46.0 % Final   MCV 01/03/2022 88.4  80.0 - 100.0 fL Final   MCH 01/03/2022 31.1  26.0 - 34.0 pg Final   MCHC 01/03/2022 35.2  30.0 - 36.0 g/dL Final   RDW 33/29/5188 12.1  11.5 - 15.5 % Final   Platelets 01/03/2022 220  150 - 400 K/uL Final   nRBC 01/03/2022 0.0  0.0 - 0.2 % Final   Neutrophils Relative % 01/03/2022 66  % Final   Neutro Abs 01/03/2022 5.4  1.7 - 7.7 K/uL Final   Lymphocytes Relative 01/03/2022 25  % Final   Lymphs Abs 01/03/2022 2.0  0.7 - 4.0 K/uL Final   Monocytes Relative 01/03/2022 7  % Final   Monocytes Absolute 01/03/2022 0.5  0.1 - 1.0 K/uL Final   Eosinophils Relative 01/03/2022 1  % Final   Eosinophils Absolute 01/03/2022 0.1  0.0 - 0.5 K/uL Final   Basophils Relative 01/03/2022 1  % Final   Basophils Absolute 01/03/2022 0.1  0.0 - 0.1 K/uL Final   Immature Granulocytes 01/03/2022 0  %  Final   Abs Immature Granulocytes 01/03/2022 0.03  0.00 - 0.07 K/uL Final   Performed at [facility], [address], [place], [place] [address]   Sodium 01/03/2022 141  135 - 145 mmol/L Final   Potassium 01/03/2022 3.7  3.5 - 5.1 mmol/L Final   Chloride 01/03/2022 109  98 - 111 mmol/L Final   CO2 01/03/2022 25  22 - 32 mmol/L Final   Glucose, Bld 01/03/2022 82  70 - 99 mg/dL Final   Glucose reference range applies only to samples taken after fasting for at least 8 hours.   BUN 01/03/2022 15  6 - 20 mg/dL Final   Creatinine, Ser 01/03/2022 0.81  0.44 - 1.00 mg/dL Final   Calcium 01/03/2022 8.8 (L)  8.9 - 10.3  mg/dL Final   Total Protein 01/03/2022 7.1  6.5 - 8.1 g/dL Final   Albumin 01/03/2022 4.2  3.5 - 5.0 g/dL Final   AST 01/03/2022 13 (L)  15 - 41 U/L Final   ALT 01/03/2022 17  0 - 44 U/L Final   Alkaline Phosphatase 01/03/2022 64  38 - 126 U/L Final   Total Bilirubin 01/03/2022 0.5  0.3 - 1.2 mg/dL Final   GFR, Estimated 01/03/2022 >60  >60 mL/min Final   Comment: (NOTE) Calculated using the CKD-EPI Creatinine Equation (2021)    Anion gap 01/03/2022 7  5 - 15 Final   Performed at [facility], [address] [place], [place] [address]   Lipase 01/03/2022 35  11 - 51 U/L Final   Performed at [facility], [address] [place], [place] [address]   Troponin I (High Sensitivity) 01/03/2022 3  <18 ng/L Final   Comment: (NOTE) Elevated high sensitivity troponin I (hsTnI) values and significant  changes across serial measurements may suggest ACS but many other  chronic and acute conditions are known to elevate hsTnI results.  Refer to the "Links" section for chest pain algorithms and additional  guidance. Performed at [facility], [address] [place], [place] [address]    Color, Urine 01/03/2022 STRAW (A)  YELLOW Final   APPearance 01/03/2022 CLEAR  CLEAR Final   Specific Gravity, Urine 01/03/2022 1.005  1.005 - 1.030 Final   pH 01/03/2022 7.0  5.0 - 8.0 Final   Glucose, UA 01/03/2022 NEGATIVE  NEGATIVE mg/dL Final   Hgb urine dipstick 01/03/2022 MODERATE (A)  NEGATIVE Final   Bilirubin Urine 01/03/2022 NEGATIVE  NEGATIVE Final   Ketones, ur 01/03/2022 NEGATIVE  NEGATIVE mg/dL Final   Protein, ur 01/03/2022 NEGATIVE  NEGATIVE mg/dL Final   Nitrite 01/03/2022 NEGATIVE  NEGATIVE Final   Leukocytes,Ua 01/03/2022 NEGATIVE  NEGATIVE Final   RBC / HPF 01/03/2022 0-5  0 - 5 RBC/hpf Final   WBC, UA 01/03/2022 0-5  0 - 5 WBC/hpf Final   Bacteria, UA 01/03/2022 RARE (A)  NONE SEEN Final   Squamous Epithelial / LPF 01/03/2022 0-5  0 - 5 Final   Mucus 01/03/2022 PRESENT   Final   Performed  at [facility], [address] [place],  [address]  Admission on 12/20/2021, Discharged on 12/21/2021  Component Date Value Ref Range Status   WBC 12/21/2021 8.5  4.0 - 10.5 K/uL Final   RBC 12/21/2021 4.96  3.87 - 5.11 MIL/uL Final   Hemoglobin 12/21/2021 15.3 (H)  12.0 - 15.0 g/dL Final   HCT 12/21/2021 44.0  36.0 - 46.0 % Final   MCV 12/21/2021 88.7  80.0 - 100.0 fL Final   [facility] 12/21/2021  30.8  26.0 - 34.0 pg Final   MCHC 12/21/2021 34.8  30.0 - 36.0 g/dL Final   RDW 16/03/9603 12.2  11.5 - 15.5 % Final   Platelets 12/21/2021 263  150 - 400 K/uL Final   nRBC 12/21/2021 0.0  0.0 - 0.2 % Final   Neutrophils Relative % 12/21/2021 53  % Final   Neutro Abs 12/21/2021 4.4  1.7 - 7.7 K/uL Final   Lymphocytes Relative 12/21/2021 37  % Final   Lymphs Abs 12/21/2021 3.2  0.7 - 4.0 K/uL Final   Monocytes Relative 12/21/2021 8  % Final   Monocytes Absolute 12/21/2021 0.7  0.1 - 1.0 K/uL Final   Eosinophils Relative 12/21/2021 1  % Final   Eosinophils Absolute 12/21/2021 0.1  0.0 - 0.5 K/uL Final   Basophils Relative 12/21/2021 1  % Final   Basophils Absolute 12/21/2021 0.1  0.0 - 0.1 K/uL Final   Immature Granulocytes 12/21/2021 0  % Final   Abs Immature Granulocytes 12/21/2021 0.02  0.00 - 0.07 K/uL Final   Performed at [facility], [address], [place], [place] [address]   Sodium 12/21/2021 144  135 - 145 mmol/L Final   Potassium 12/21/2021 3.0 (L)  3.5 - 5.1 mmol/L Final   Chloride 12/21/2021 113 (H)  98 - 111 mmol/L Final   CO2 12/21/2021 24  22 - 32 mmol/L Final   Glucose, Bld 12/21/2021 97  70 - 99 mg/dL Final   Glucose reference range applies only to samples taken after fasting for at least 8 hours.   BUN 12/21/2021 9  6 - 20 mg/dL Final   Creatinine, Ser 12/21/2021 0.74  0.44 - 1.00 mg/dL Final   Calcium 11/91/4782 8.6 (L)  8.9 - 10.3 mg/dL Final   Total Protein 95/62/1308 7.1  6.5 - 8.1 g/dL Final   Albumin 65/78/4696 4.2  3.5 - 5.0 g/dL Final   AST 29/52/8413 11 (L)   15 - 41 U/L Final   ALT 12/21/2021 12  0 - 44 U/L Final   Alkaline Phosphatase 12/21/2021 64  38 - 126 U/L Final   Total Bilirubin 12/21/2021 0.4  0.3 - 1.2 mg/dL Final   GFR, Estimated 12/21/2021 >60  >60 mL/min Final   Comment: (NOTE) Calculated using the CKD-EPI Creatinine Equation (2021)    Anion gap 12/21/2021 7  5 - 15 Final   Performed at [facility], [address] [place], [place] [address]   Lipase 12/21/2021 29  11 - 51 U/L Final   Performed at [facility], [address] [place], [place] [address]   Color, Urine 12/21/2021 YELLOW  YELLOW Final   APPearance 12/21/2021 HAZY (A)  CLEAR Final   Specific Gravity, Urine 12/21/2021 1.006  1.005 - 1.030 Final   pH 12/21/2021 7.0  5.0 - 8.0 Final   Glucose, UA 12/21/2021 NEGATIVE  NEGATIVE mg/dL Final   Hgb urine dipstick 12/21/2021 MODERATE (A)  NEGATIVE Final   Bilirubin Urine 12/21/2021 NEGATIVE  NEGATIVE Final   Ketones, ur 12/21/2021 NEGATIVE  NEGATIVE mg/dL Final   Protein, ur 36/64/4034 NEGATIVE  NEGATIVE mg/dL Final   Nitrite 74/25/9563 NEGATIVE  NEGATIVE Final   Leukocytes,Ua 12/21/2021 NEGATIVE  NEGATIVE Final   RBC / HPF 12/21/2021 0-5  0 - 5 RBC/hpf Final   WBC, UA 12/21/2021 0-5  0 - 5 WBC/hpf Final   Bacteria, UA 12/21/2021 RARE (A)  NONE SEEN Final   Squamous Epithelial / LPF 12/21/2021 21-50  0 - 5 Final  Budding Yeast 12/21/2021 PRESENT   Final   Performed at [facility], [address], [place], [place] [address]   Specimen Description 12/21/2021    Final                   Value:URINE, CLEAN CATCH Performed at [facility], [address] [place], [place] [address]    Special Requests 12/21/2021    Final                   Value:NONE Performed at [facility], [address] [place], [place] [address]    Culture 12/21/2021 MULTIPLE SPECIES PRESENT, SUGGEST RECOLLECTION (A)   Final   Report Status 12/21/2021 12/23/2021 FINAL   Final    Allergies: Tetracyclines & related and Ultram [tramadol]  PTA  Medications: (Not in a hospital admission)   Medical Decision Making  Pt under IVC, will need to be renewed on:  Recommended for inpatient psychiatric admission  Meds ordered this encounter  Medications   acetaminophen (TYLENOL) tablet 650 mg   alum & mag hydroxide-simeth (MAALOX/MYLANTA) 200-200-20 MG/5ML suspension 30 mL   magnesium hydroxide (MILK OF MAGNESIA) suspension 30 mL   hydrOXYzine (ATARAX) tablet 25 mg   AND Linked Order Group    OLANZapine zydis (ZYPREXA) disintegrating tablet 5 mg    LORazepam (ATIVAN) tablet 1 mg    ziprasidone (GEODON) injection 20 mg   mirtazapine (REMERON) tablet 7.5 mg   feeding supplement (ENSURE ENLIVE / ENSURE PLUS) liquid 237 mL   Lab Orders         Resp Panel by RT-PCR (Flu A&B, Covid) Anterior Nasal Swab         CBC with Differential/Platelet         Comprehensive metabolic panel         Hemoglobin A1c         Ethanol         Lipid panel         TSH         POCT Urine Drug Screen - (I-Screen)     Recommendations  Based on my evaluation the patient does not appear to have an emergency medical condition.  Lauree Chandler, NP 04/11/22  4:07 PM

## 2022-04-11 NOTE — Tx Team (Signed)
Initial Treatment Plan 04/11/2022 6:44 PM Marie Diaz WGN:[id number]    PATIENT STRESSORS: Loss of husband     PATIENT STRENGTHS: Average or above average intelligence    PATIENT IDENTIFIED PROBLEMS: Depression  Suicide Ideation  anxiety  Agitation  Shakiness  Appetites Decreased   Insomnia         DISCHARGE CRITERIA:  Safe-care adequate arrangements made  PRELIMINARY DISCHARGE PLAN: Participate in family therapy Return to previous living arrangement  PATIENT/FAMILY INVOLVEMENT: This treatment plan has been presented to and reviewed with the patient, Marie Diaz. The patient has been given the opportunity to ask questions and make suggestions.  Dorris Carnes, RN 04/11/2022, 6:44 PM

## 2022-04-11 NOTE — ED Triage Notes (Signed)
Pt presents to [facility] accompanied by her daughter due to worsening depression symptoms. Pt states her husband died 3 months ago and she has been experiencing SI with a plan to walk in front of a car. Pt reports weight loss and loss of appetite. She reports auditory hallucinations yesterday, hearing a voice telling her to kill herself. Pt denies HI, NSSIB, drug or alcohol use and AVH.

## 2022-04-11 NOTE — ED Provider Notes (Cosign Needed Addendum)
FBC/OBS ASAP Discharge Summary  Date and Time: 04/11/2022 4:13 PM  Name: Marie Diaz  MRN:  [id number]   Discharge Diagnoses:  Final diagnoses:  Depression, unspecified depression type  Suicidal ideation   Subjective:   Pt has been accepted to [facility] for inpatient admission. Pt under IVC. Reviewed with pt who verbalized understanding of treatment plan.  Per H&P: Pt presents voluntarily to [facility] behavioral health for walk-in assessment.  Pt is accompanied by her daughter, Ladona Ridgel. Pt is assessed face-to-face by nurse practitioner and Dr. Cyndie Chime.    Marie Diaz, 56 y.o., female patient seen face to face by this provider, and chart reviewed on 04/11/22.  On evaluation Marie Diaz reports presenting to this facility today due to suicidal thoughts, "I want to kill myself". She reports experiencing SI with plan to run in front of a car with intent for the past week. Pt denies current AH. She reports experiencing CAH to "kill yourself" and telling her to run in front of a car for the past 2 weeks. She describes CAH as an unknown female voice. She denies VH. Pt reports paranoia although when asked about this states "I don't know" and does not elaborate. Pt reports poor appetite, with 15 lb weight loss in a month. She reports poor appetite at baseline, although worsening recently. Pt endorses poor sleep, sleeping about 6 hours/night, although having difficulty falling and staying asleep.   Pt denies HI/VI.    Pt denies history of NSSI, SA, or inpatient psychiatric hospitalization.   Pt reports smoking 1 pack of cigarettes/day "for years". She denies alcohol, marijuana, crack/cocaine, other illicit drug use. She states she is prescribed Xanax.    Pt reports daily medications include Xanax for anxiety and Clonidine for hypertension. Xanax is prescribed as 1mg  TID, although pt is not taking it that way. Pt states she uses it on a less than daily basis. Pt last took Xanax and Clonidine  last night.    Pt states she is living with her 11 y/o son. Pt states her son and her daughter are strong support for her.   Pt reports access to a firearm that used to be her husband's. Her husband passed away 3 months ago. Pt reports firearm is locked, unloaded, and ammunition is locked separately from the firearm.,   Pt denies knowledge of family psychiatric history.   Pt reports she is not currently employed. Highest level of education is the 11th grade.    Pt gives verbal consent for her daughter 32 to join the session. Ladona Ridgel states she feels pt has "given up". She states 3 days ago pt attempted to run out of the house to try to run in front of a car and was stopped by her brother. Per Ladona Ridgel, pt's appetite is poor at baseline, although has been worse in the past week. Ladona Ridgel states pt has chronic abd pain although has been denying any pain for the past month. Ladona Ridgel states pt is prescribed Xanax TID although takes less than that a day. Ladona Ridgel is concerned pt is a danger to herself.   During evaluation Marie Diaz is a&ox3. She appears anxious and tremorous. She appears older than stated age. She is ill appearing. She is cooperative, although withdrawn. She makes minimal eye contact. Her speech is clear and coherent, although hesitant, with decreased volume. Her reported mood is anxious, depressed, dysphoric. Her affect is congruent, flat. TP is coherent. Description of associations intact. TC is logical. She does not  appear to be actively responding to internal stimuli. There is no evidence of agitation or aggression.    Discussed with pt and Ladona Ridgel recommendation for inpatient psychiatric admission.   EKGs discussed with EDP Dr. Theresia Lo. Likely rate related ST depression. No acute concerns as pt is asymptomatic.  Stay Summary:  Pt is a 56 y/o female presenting to [facility] endorsing CAH, SI w/ plan and intent. Pt has been recommended for inpatient psychiatric admission. She has been  accepted to [facility].  Total Time spent with patient: 30 minutes  Past Psychiatric History:  Past Medical History:  Past Medical History:  Diagnosis Date   Anxiety    Depression    Gastric ulcer    Gastritis    Hypertension     Past Surgical History:  Procedure Laterality Date   ABDOMINAL HYSTERECTOMY     BIOPSY  08/29/2019   Procedure: BIOPSY;  Surgeon: West Bali, MD;  Location: [facility];  Service: Endoscopy;;   BIOPSY  02/26/2021   Procedure: BIOPSY;  Surgeon: Lanelle Bal, DO;  Location: [facility];  Service: Endoscopy;;   CHOLECYSTECTOMY     ESOPHAGOGASTRODUODENOSCOPY N/A 08/29/2019   normal esophagus, localized moderate inflammation with adherent blood, congestion, erosions, and friability on greater curvature of stomach. Biopsy with chronic gastritis, negative H.pylori.    ESOPHAGOGASTRODUODENOSCOPY (EGD) WITH PROPOFOL N/A 02/26/2021   gastritis and pathology with reactive gastropathy and negative H.pylori   HEMORRHOID SURGERY     Family History:  Family History  Problem Relation Age of Onset   Colon cancer Neg Hx    Colon polyps Neg Hx    Family Psychiatric History: None reported Social History:  Social History   Substance and Sexual Activity  Alcohol Use No     Social History   Substance and Sexual Activity  Drug Use No    Social History   Socioeconomic History   Marital status: Married    Spouse name: Not on file   Number of children: Not on file   Years of education: Not on file   Highest education level: Not on file  Occupational History   Not on file  Tobacco Use   Smoking status: Every Day    Packs/day: 0.50    Types: Cigarettes   Smokeless tobacco: Never  Vaping Use   Vaping Use: Former  Substance and Sexual Activity   Alcohol use: No   Drug use: No   Sexual activity: Yes  Other Topics Concern   Not on file  Social History Narrative   [phone]), kids(2). 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 DOESN'T  HAVE TIME FOR SELF CARE. SHE'S AN ONLY CHILD.   Social Determinants of Health   Financial Resource Strain: Not on file  Food Insecurity: Not on file  Transportation Needs: Not on file  Physical Activity: Not on file  Stress: Not on file  Social Connections: Not on file   SDOH:  SDOH Screenings   Tobacco Use: High Risk (04/11/2022)    Tobacco Cessation:  Prescription not provided because: inpatient psychiatric admission  Current Medications:  Current Facility-Administered Medications  Medication Dose Route Frequency Provider Last Rate Last Admin   acetaminophen (TYLENOL) tablet 650 mg  650 mg Oral Q6H PRN Lauree Chandler, NP       alum & mag hydroxide-simeth (MAALOX/MYLANTA) 200-200-20 MG/5ML suspension 30 mL  30 mL Oral Q4H PRN Lauree Chandler, NP       feeding supplement (ENSURE ENLIVE /  ENSURE PLUS) liquid 237 mL  237 mL Oral BID BM Lauree Chandler, NP   237 mL at 04/11/22 1430   hydrOXYzine (ATARAX) tablet 25 mg  25 mg Oral TID PRN Lauree Chandler, NP       OLANZapine zydis (ZYPREXA) disintegrating tablet 5 mg  5 mg Oral Q8H PRN Lauree Chandler, NP       And   LORazepam (ATIVAN) tablet 1 mg  1 mg Oral PRN Lauree Chandler, NP       And   ziprasidone (GEODON) injection 20 mg  20 mg Intramuscular PRN Lauree Chandler, NP       magnesium hydroxide (MILK OF MAGNESIA) suspension 30 mL  30 mL Oral Daily PRN Lauree Chandler, NP       mirtazapine (REMERON) tablet 7.5 mg  7.5 mg Oral QHS Lauree Chandler, NP       Current Outpatient Medications  Medication Sig Dispense Refill   ALPRAZolam (XANAX) 1 MG tablet Take 1 mg by mouth 3 (three) times daily.     cloNIDine (CATAPRES) 0.2 MG tablet Take 0.5 tablets (0.1 mg total) by mouth 2 (two) times daily. (Patient taking differently: Take 0.2 mg by mouth 2 (two) times daily.)      PTA Medications: (Not in a hospital admission)       No data to display          Flowsheet Row ED from 04/11/2022 in  [facility] ED from 03/16/2022 in [facility] EMERGENCY DEPARTMENT ED from 02/10/2022 in [facility] EMERGENCY DEPARTMENT  C-SSRS RISK CATEGORY High Risk No Risk No Risk       Musculoskeletal  Strength & Muscle Tone: within normal limits Gait & Station: normal Patient leans: N/A  Psychiatric Specialty Exam  Presentation  General Appearance:  Other (comment) (dressed in casual clothes, with a bath robe on top)  Eye Contact: Minimal  Speech: Clear and Coherent; Other (comment) (hesitant speech)  Speech Volume: Decreased  Handedness:No data recorded  Mood and Affect  Mood: Anxious; Depressed; Dysphoric  Affect: Congruent; Flat   Thought Process  Thought Processes: Coherent  Descriptions of Associations:Intact  Orientation:Full (Time, Place and Person)  Thought Content:Logical  Diagnosis of Schizophrenia or Schizoaffective disorder in past: No  Duration of Psychotic Symptoms: Less than six months   Hallucinations:Hallucinations: None; Other (comment) (None currently, although reports experiencing CAH to "kill yourself" and telling her to run in front of a car for the past 2 weeks)  Ideas of Reference:Paranoia; Other (comment) (Pt reports paranoia although when asked about this states "I don't know")  Suicidal Thoughts:Suicidal Thoughts: Yes, Active SI Active Intent and/or Plan: With Plan; With Intent  Homicidal Thoughts:Homicidal Thoughts: No   Sensorium  Memory: Immediate Fair; Recent Fair  Judgment: Fair  Insight: Present   Executive Functions  Concentration: Fair  Attention Span: Fair  Recall: [employer] of Knowledge: Fair  Language: Fair   Psychomotor Activity  Psychomotor Activity: Psychomotor Activity: Other (comment) (tremorous)   Assets  Assets: Communication Skills; Desire for Improvement; Housing; Resilience; Social Support   Sleep  Sleep: Sleep: Poor   No data recorded  Physical Exam   Physical Exam Cardiovascular:     Rate and Rhythm: Normal rate.  Pulmonary:     Effort: Pulmonary effort is normal.  Neurological:     Mental Status: She is oriented to person, place, and time.  Psychiatric:        Mood and  Affect: Mood is anxious and depressed. Affect is flat.        Speech: Speech is delayed.        Behavior: Behavior is withdrawn. Behavior is cooperative.        Thought Content: Thought content is paranoid. Thought content includes suicidal ideation. Thought content includes suicidal plan.    Review of Systems  Constitutional:  Negative for chills and fever.  Respiratory:  Negative for shortness of breath.   Cardiovascular:  Negative for chest pain and palpitations.  Gastrointestinal:  Negative for abdominal pain.  Neurological:  Negative for headaches.  Psychiatric/Behavioral:  Positive for depression and suicidal ideas. The patient is nervous/anxious.    Blood pressure 118/62, pulse 100, temperature 98.7 F (37.1 C), resp. rate 18, SpO2 99 %. There is no height or weight on file to calculate BMI.  Demographic Factors:  Caucasian, Low socioeconomic status, Unemployed, and Access to firearms  Loss Factors: Loss of significant relationship, Decline in physical health, Legal issues, and Financial problems/change in socioeconomic status  Historical Factors: NA  Risk Reduction Factors:   Living with another person, especially a relative  Continued Clinical Symptoms:  Previous Psychiatric Diagnoses and Treatments  Cognitive Features That Contribute To Risk:  None    Suicide Risk:  Extreme:  Frequent, intense, and enduring suicidal ideation, specific plans, clear subjective and objective intent, impaired self-control, severe dysphoria/symptomatology, many risk factors and no protective factors.  Plan Of Care/Follow-up recommendations:  Inpatient psychiatric hospitalization  Disposition:  Inpatient psychiatric hospitalization  Tharon Aquas,  NP 04/11/2022, 4:13 PM

## 2022-04-11 NOTE — Progress Notes (Addendum)
Pt was accepted to [facility] 04/11/22: 500  PENDING:labs, uds, covid PCR and IVC.  Pt meets inpatient criteria per   Attending Physician will be  Report can be called to:Adult unit: [phone]  Pt can arrive after: 1630  Nursing updated that pending items are completed and pt can transfer to [place] Team notified: [facility] Lynnda Shields, RN, Adalberto Ill, RN, Tharon Aquas, NP, Sheliah Plane, RN, Roxy Manns, RN  Nadara Mode, [place] 04/11/2022 @ 5:54 PM

## 2022-04-12 DIAGNOSIS — F333 Major depressive disorder, recurrent, severe with psychotic symptoms: Secondary | ICD-10-CM | POA: Diagnosis not present

## 2022-04-12 MED ORDER — NICOTINE 14 MG/24HR TD PT24
14.0000 mg | MEDICATED_PATCH | Freq: Every day | TRANSDERMAL | Status: DC
  Filled 2022-04-12: qty 1

## 2022-04-12 MED ORDER — ALPRAZOLAM 0.5 MG PO TABS
0.5000 mg | ORAL_TABLET | Freq: Two times a day (BID) | ORAL | Status: DC | PRN
  Administered 2022-04-12 – 2022-04-27 (×17): 0.5 mg via ORAL
  Filled 2022-04-12 (×17): qty 1

## 2022-04-12 MED ORDER — OLANZAPINE 5 MG PO TABS
5.0000 mg | ORAL_TABLET | Freq: Every day | ORAL | Status: DC
  Administered 2022-04-12: 5 mg via ORAL
  Filled 2022-04-12 (×2): qty 1

## 2022-04-12 MED ORDER — CLONIDINE HCL 0.1 MG PO TABS
0.1000 mg | ORAL_TABLET | Freq: Two times a day (BID) | ORAL | Status: DC
  Administered 2022-04-12 – 2022-04-19 (×14): 0.1 mg via ORAL
  Filled 2022-04-12 (×20): qty 1

## 2022-04-12 NOTE — H&P (Addendum)
Psychiatric Admission Assessment Adult  Patient Identification: Marie Diaz MRN:  [id number] Date of Evaluation:  04/12/2022 Chief Complaint:  Depression [F32.A] Principal Diagnosis: Depression Diagnosis:  Principal Problem:   Depression  [name] is a 56yo F with past history of anxiety, who was admitted to [facility] due to depression, suicidal ideations and commanding auditory hallucinations.  ED workup: UDS pos for benzos, BAL < 10; EKG - sinus tachy, Qtc 44ms, likely rate related ST depression; CBC with wbc 11.8, rbc 5.27, Hb 16.7; CMP with K 3.0.  History of Present Illness:  Patient reports her depression started about two months ago after her husband of 33 years passed away. She reports that since that her mood remains "down", depressed, sad, empty, unmotivated, with diminished interest or pleasure in activities, insomnia, poor concentration. She reports that about a month ago she started experiencing auditory hallucinations of "voices". Reports that "voices" come and go, although for the last two weeks "man voice" tell her "jump out of the bridge or run in front of a car". Patient got scared that she will impulsively act on that command, and came to the hospital. Denies having any thoughts of harming others. She denies past suicidal attempts. She reports they have guns at home, but she denies any urges to use them, never wanted to use them to harm self or others. Patient reports firearm is locked, unloaded, and ammunition is locked separately from the firearm. Patient complaints of anxiety, was diagnosed with anxiety disorder in the past and reports being on Xanax prescribed as 1mg  TID, although she states she uses it less "as needed". Reports current anxiety and possible withdrawal - "feeling shaky"; asking for Xanax dose here. Patient denies any current or past symptoms of mania, such as increased energy, feeling irritable, easily distractible, unusual talkativeness. Denies decreased  need for sleep. Reports she is not sleeping well due to "hearing voices". Patient does not express any delusions. Patient reports feeling safe in their environment. Denies problems with appetite.  Patient denies past history of trauma or abuse.  Pt reports smoking 1 pack of cigarettes/day "for years". She declined smoking cessation medications here. She denies alcohol, illicit drug use.   Per Psych Consult Note: "Pt gives verbal consent for her daughter Marie Diaz to join the session. Marie Diaz states she feels pt has "given up". She states 3 days ago pt attempted to run out of the house to try to run in front of a car and was stopped by her brother. Per Marie Diaz, pt's appetite is poor at baseline, although has been worse in the past week. Marie Diaz states pt has chronic abd pain although has been denying any pain for the past month. Marie Diaz states pt is prescribed Xanax TID although takes less than that a day. Marie Diaz is concerned pt is a danger to herself."  Past psychiatric history: Previous psych diagnoses: anxiety Previous psychiatric hospitalizations: denies Previous outpatient psychiatrist: no Therapist/Counselor: no History of prior suicide attempts: denies Non-suicidal self-injurious behaviors: denies History of violence: denies Previous psych medication: Lexapro, Xanax Current psych medications: Xanax  STATE PRESCRIPTION DRUG MONITORING PROGRAM:  03/20/2022 03/20/2022 1  Alprazolam 1 Mg Tablet 90.00 30 Ki Dic [id number] Mit (3484) 0/0 6.00 LME Private Pay [place] 03/20/2022 03/20/2022 1  Oxycodone Hcl (Ir) 5 Mg Tablet 6.00 2 Be Fot [id number] Mit (3484) 0/0 22.50 MME Private Pay [place] 02/17/2022 02/11/2022 1  Alprazolam 1 Mg Tablet 90.00 30 Ki Dic [id number] Mit (3484) 0/0 6.00 LME Private Pay [facility] 01/17/2022 01/13/2022 1  Alprazolam 1 Mg Tablet 90.00 30 Ki Dic [id number] Mit (3484) 0/0 6.00 LME Private Pay [place]   Social history: -Patient has no guardian. -Adverse childhood experience:  denies h/o physical,  emotional, sexual abuse. 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 -Military History: denies -Guns in possession: yes, locked.  SUBSTANCE USE: Alcohol: denies Nicotine:  yes Illicit drug use: denies.   Family history: Patient denies suicide in family members.    [place] Scale:  Flowsheet Row Admission (Current) from 04/11/2022 in [place] [facility] Most recent reading at 04/11/2022  6:00 PM ED from 04/11/2022 in [facility] Most recent reading at 04/11/2022 11:51 AM ED from 03/16/2022 in [place] Most recent reading at 03/16/2022 12:55 AM  C-SSRS RISK CATEGORY High Risk High Risk No Risk        Prior Inpatient Therapy:   Prior Outpatient Therapy:    Alcohol Screening: Patient refused Alcohol Screening Tool: Yes 1. How often do you have a drink containing alcohol?: Never 2. How many drinks containing alcohol do you have on a typical day when you are drinking?: 1 or 2 3. How often do you have six or more drinks on one occasion?: Never AUDIT-C Score: 0 4. How often during the last year have you found that you were not able to stop drinking once you had started?: Never 5. How often during the last year have you failed to do what was normally expected from you because of drinking?: Never 6. How often during the last year have you needed a first drink in the morning to get yourself going after a heavy drinking session?: Never 7. How often during the last year have you had a feeling of guilt of remorse after drinking?: Never 8. How often during the last year have you been unable to remember what happened the night before because you had been drinking?: Never 9. Have you or someone else been injured as a result of your drinking?: No 10. Has a relative or friend or a doctor  or another health worker been concerned about your drinking or suggested you cut down?: No Alcohol Use Disorder Identification Test Final Score (AUDIT): 0 Alcohol Brief Interventions/Follow-up: Patient Refused  Past Medical History:  Past Medical History:  Diagnosis Date   Anxiety    Depression    Gastric ulcer    Gastritis    Hypertension     Past Surgical History:  Procedure Laterality Date   ABDOMINAL HYSTERECTOMY     BIOPSY  08/29/2019   Procedure: BIOPSY;  Surgeon: Danie Binder, MD;  [facility];  Service: Endoscopy;;   BIOPSY  02/26/2021   Procedure: BIOPSY;  Surgeon: Eloise Harman, DO;  Location: [facility];  Service: Endoscopy;;   CHOLECYSTECTOMY     ESOPHAGOGASTRODUODENOSCOPY N/A 08/29/2019   normal esophagus, localized moderate inflammation with adherent blood, congestion, erosions, and friability on greater curvature of stomach. Biopsy with chronic gastritis, negative H.pylori.    ESOPHAGOGASTRODUODENOSCOPY (EGD) WITH PROPOFOL N/A 02/26/2021   gastritis and pathology with reactive gastropathy and negative H.pylori   HEMORRHOID SURGERY     Family History:  Family History  Problem Relation Age of Onset   Colon cancer Neg Hx    Colon polyps Neg Hx     Tobacco Screening:   Social History:  Social History   Substance and Sexual Activity  Alcohol Use No  Social History   Substance and Sexual Activity  Drug Use No    Additional Social History:                           Allergies:   Allergies  Allergen Reactions   Tetracyclines & Related Other (See Comments)    Unknown reaction (patient does not recall)   Ultram [Tramadol] Other (See Comments)    "knocks me out"   Lab Results:  Results for orders placed or performed during the hospital encounter of 04/11/22 (from the past 48 hour(s))  Resp Panel by RT-PCR (Flu A&B, Covid) Anterior Nasal Swab     Status: None   Collection Time: 04/11/22 12:17 PM   Specimen: Anterior  Nasal Swab  Result Value Ref Range   SARS Coronavirus 2 by RT PCR NEGATIVE NEGATIVE    Comment: (NOTE) SARS-CoV-2 target nucleic acids are NOT DETECTED.  The SARS-CoV-2 RNA is generally detectable in upper respiratory specimens during the acute phase of infection. The lowest concentration of SARS-CoV-2 viral copies this assay can detect is 138 copies/mL. A negative result does not preclude SARS-Cov-2 infection and should not be used as the sole basis for treatment or other patient management decisions. A negative result may occur with  improper specimen collection/handling, submission of specimen other than nasopharyngeal swab, presence of viral mutation(s) within the areas targeted by this assay, and inadequate number of viral copies(<138 copies/mL). A negative result must be combined with clinical observations, patient history, and epidemiological information. The expected result is Negative.  Fact Sheet for Patients:  EntrepreneurPulse.com.au  Fact Sheet for Healthcare Providers:  IncredibleEmployment.be  This test is no t yet approved or cleared by the [place] FDA and  has been authorized for detection and/or diagnosis of SARS-CoV-2 by FDA under an Emergency Use Authorization (EUA). This EUA will remain  in effect (meaning this test can be used) for the duration of the COVID-19 declaration under Section 564(b)(1) of the Act, 21 U.S.C.section 360bbb-3(b)(1), unless the authorization is terminated  or revoked sooner.       Influenza A by PCR NEGATIVE NEGATIVE   Influenza B by PCR NEGATIVE NEGATIVE    Comment: (NOTE) The Xpert Xpress SARS-CoV-2/FLU/RSV plus assay is intended as an aid in the diagnosis of influenza from Nasopharyngeal swab specimens and should not be used as a sole basis for treatment. Nasal washings and aspirates are unacceptable for Xpert Xpress SARS-CoV-2/FLU/RSV testing.  Fact Sheet for  Patients: EntrepreneurPulse.com.au  Fact Sheet for Healthcare Providers: IncredibleEmployment.be  This test is not yet approved or cleared by the [place] FDA and has been authorized for detection and/or diagnosis of SARS-CoV-2 by FDA under an Emergency Use Authorization (EUA). This EUA will remain in effect (meaning this test can be used) for the duration of the COVID-19 declaration under Section 564(b)(1) of the Act, 21 U.S.C. section 360bbb-3(b)(1), unless the authorization is terminated or revoked.  Performed at [facility], [place] [address] [place], [place] [address]   CBC with Differential/Platelet     Status: Abnormal   Collection Time: 04/11/22 12:17 PM  Result Value Ref Range   WBC 11.8 (H) 4.0 - 10.5 K/uL   RBC 5.27 (H) 3.87 - 5.11 MIL/uL   Hemoglobin 16.7 (H) 12.0 - 15.0 g/dL   HCT 45.8 36.0 - 46.0 %   MCV 86.9 80.0 - 100.0 fL   MCH 31.7 26.0 - 34.0 pg   MCHC 36.5 (H) 30.0 -  36.0 g/dL   RDW 13.0 11.5 - 15.5 %   Platelets 291 150 - 400 K/uL   nRBC 0.0 0.0 - 0.2 %   Neutrophils Relative % 72 %   Neutro Abs 8.4 (H) 1.7 - 7.7 K/uL   Lymphocytes Relative 18 %   Lymphs Abs 2.2 0.7 - 4.0 K/uL   Monocytes Relative 9 %   Monocytes Absolute 1.1 (H) 0.1 - 1.0 K/uL   Eosinophils Relative 0 %   Eosinophils Absolute 0.0 0.0 - 0.5 K/uL   Basophils Relative 1 %   Basophils Absolute 0.1 0.0 - 0.1 K/uL   Immature Granulocytes 0 %   Abs Immature Granulocytes 0.05 0.00 - 0.07 K/uL    Comment: Performed at [facility] [address], [place], [place] [address]  Comprehensive metabolic panel     Status: Abnormal   Collection Time: 04/11/22 12:17 PM  Result Value Ref Range   Sodium 143 135 - 145 mmol/L   Potassium 3.0 (L) 3.5 - 5.1 mmol/L   Chloride 107 98 - 111 mmol/L   CO2 24 22 - 32 mmol/L   Glucose, Bld 110 (H) 70 - 99 mg/dL    Comment: Glucose reference range applies only to samples taken after fasting for at least 8  hours.   BUN <5 (L) 6 - 20 mg/dL   Creatinine, Ser 0.64 0.44 - 1.00 mg/dL   Calcium 9.4 8.9 - 10.3 mg/dL   Total Protein 6.4 (L) 6.5 - 8.1 g/dL   Albumin 4.0 3.5 - 5.0 g/dL   AST 15 15 - 41 U/L   ALT 15 0 - 44 U/L   Alkaline Phosphatase 69 38 - 126 U/L   Total Bilirubin 0.5 0.3 - 1.2 mg/dL   GFR, Estimated >60 >60 mL/min    Comment: (NOTE) Calculated using the CKD-EPI Creatinine Equation (2021)    Anion gap 12 5 - 15    Comment: Performed at [facility] [address], [place], [place] [address]  Hemoglobin A1c     Status: None   Collection Time: 04/11/22 12:17 PM  Result Value Ref Range   Hgb A1c MFr Bld 4.8 4.8 - 5.6 %    Comment: (NOTE) Pre diabetes:          5.7%-6.4%  Diabetes:              >6.4%  Glycemic control for   <7.0% adults with diabetes    Mean Plasma Glucose 91.06 mg/dL    Comment: Performed at [facility] [address], [place], [place] [address]  Ethanol     Status: None   Collection Time: 04/11/22 12:17 PM  Result Value Ref Range   Alcohol, Ethyl (B) <10 <10 mg/dL    Comment: (NOTE) Lowest detectable limit for serum alcohol is 10 mg/dL.  For medical purposes only. Performed at [facility], [place] [address] [place], [place] [address]   TSH     Status: None   Collection Time: 04/11/22 12:17 PM  Result Value Ref Range   TSH 1.902 0.350 - 4.500 uIU/mL    Comment: Performed by a 3rd Generation assay with a functional sensitivity of <=0.01 uIU/mL. Performed at [facility], [place] [address] [place], [place] [address]   Lipid panel     Status: Abnormal   Collection Time: 04/11/22 12:19 PM  Result Value Ref Range   Cholesterol 177 0 - 200 mg/dL   Triglycerides 193 (H) <150  mg/dL   HDL 41 >40 mg/dL   Total CHOL/HDL Ratio 4.3 RATIO   VLDL 39 0 - 40 mg/dL   LDL Cholesterol 97 0 - 99 mg/dL    Comment:        Total Cholesterol/HDL:CHD Risk Coronary Heart Disease Risk Table                     Men   Women  1/2 Average Risk    3.4   3.3  Average Risk       5.0   4.4  2 X Average Risk   9.6   7.1  3 X Average Risk  23.4   11.0        Use the calculated Patient Ratio above and the CHD Risk Table to determine the patient's CHD Risk.        ATP III CLASSIFICATION (LDL):  <100     mg/dL   Optimal  100-129  mg/dL   Near or Above                    Optimal  130-159  mg/dL   Borderline  160-189  mg/dL   High  >190     mg/dL   Very High Performed at [facility] [address], [place], [place] [address]   POCT Urine Drug Screen - (I-Screen)     Status: Abnormal   Collection Time: 04/11/22  2:35 PM  Result Value Ref Range   POC Amphetamine UR None Detected NONE DETECTED (Cut Off Level 1000 ng/mL)   POC Secobarbital (BAR) None Detected NONE DETECTED (Cut Off Level 300 ng/mL)   POC Buprenorphine (BUP) None Detected NONE DETECTED (Cut Off Level 10 ng/mL)   POC Oxazepam (BZO) Positive (A) NONE DETECTED (Cut Off Level 300 ng/mL)   POC Cocaine UR None Detected NONE DETECTED (Cut Off Level 300 ng/mL)   POC Methamphetamine UR None Detected NONE DETECTED (Cut Off Level 1000 ng/mL)   POC Morphine None Detected NONE DETECTED (Cut Off Level 300 ng/mL)   POC Methadone UR None Detected NONE DETECTED (Cut Off Level 300 ng/mL)   POC Oxycodone UR None Detected NONE DETECTED (Cut Off Level 100 ng/mL)   POC Marijuana UR None Detected NONE DETECTED (Cut Off Level 50 ng/mL)    Blood Alcohol level:  Lab Results  Component Value Date   ETH <10 04/11/2022   ETH <10 99991111    Metabolic Disorder Labs:  Lab Results  Component Value Date   HGBA1C 4.8 04/11/2022   MPG 91.06 04/11/2022   No results found for: "PROLACTIN" Lab Results  Component Value Date   CHOL 177 04/11/2022   TRIG 193 (H) 04/11/2022   HDL 41 04/11/2022   CHOLHDL 4.3 04/11/2022   VLDL 39 04/11/2022   LDLCALC 97 04/11/2022    Current Medications: Current Facility-Administered Medications  Medication Dose Route Frequency Provider Last Rate  Last Admin   acetaminophen (TYLENOL) tablet 650 mg  650 mg Oral Q6H PRN Tharon Aquas, NP       alum & mag hydroxide-simeth (MAALOX/MYLANTA) 200-200-20 MG/5ML suspension 30 mL  30 mL Oral Q4H PRN Tharon Aquas, NP       cloNIDine (CATAPRES) tablet 0.1 mg  0.1 mg Oral BID Larita Fife, MD       feeding supplement (ENSURE ENLIVE / ENSURE PLUS) liquid 237 mL  237 mL Oral BID BM Tharon Aquas, NP   237 mL at 04/12/22  1027   hydrOXYzine (ATARAX) tablet 10 mg  10 mg Oral TID PRN Tharon Aquas, NP   10 mg at 04/11/22 2351   OLANZapine zydis (ZYPREXA) disintegrating tablet 5 mg  5 mg Oral Q8H PRN Tharon Aquas, NP   5 mg at 04/11/22 1657   And   LORazepam (ATIVAN) tablet 1 mg  1 mg Oral PRN Tharon Aquas, NP       And   ziprasidone (GEODON) injection 20 mg  20 mg Intramuscular PRN Tharon Aquas, NP       magnesium hydroxide (MILK OF MAGNESIA) suspension 30 mL  30 mL Oral Daily PRN Tharon Aquas, NP       mirtazapine (REMERON) tablet 7.5 mg  7.5 mg Oral QHS Tharon Aquas, NP   7.5 mg at 04/11/22 2056   OLANZapine (ZYPREXA) tablet 5 mg  5 mg Oral QHS Larita Fife, MD       PTA Medications: Medications Prior to Admission  Medication Sig Dispense Refill Last Dose   ALPRAZolam (XANAX) 1 MG tablet Take 1 mg by mouth 3 (three) times daily.      cloNIDine (CATAPRES) 0.2 MG tablet Take 0.5 tablets (0.1 mg total) by mouth 2 (two) times daily. (Patient taking differently: Take 0.2 mg by mouth 2 (two) times daily.)       Musculoskeletal: Strength & Muscle Tone: within normal limits Gait & Station: normal Patient leans: N/A            Psychiatric Specialty Exam: Appearance:  CF, appearing stated age,  wearing appropriate to the situation casual/hospital clothes, with fair grooming and hygiene. Normal level of alertness and appropriate facial expression.  Attitude/Behavior: calm, cooperative, engaging with appropriate eye contact.  Motor: WNL;  dyskinesias not evident. Gait appears in full range.  Speech: spontaneous, clear, coherent, normal comprehension.  Mood: "depressed ".  Affect: restricted.  Thought process: patient appears coherent, organized, logical, goal-directed, associations are appropriate, concrete but linear with questions  Thought content: patient reports suicidal thoughts, denies homicidal thoughts; did not express any delusions.  Thought perception: patient reports commanding auditory hallucinations to "kill yourself" and telling her to run in front of a car for the past 2 weeks. No visual hallucinations. Did not appear internally stimulated during the interview.  Cognition: patient is alert and oriented in self, place, date; with intact attention and concentration.  Insight: fair  Judgement: fair, in regards of ability to make good decisions concerning the appropriate thing to do in various situations, including ability to form opinions regarding their mental health condition.   Psychomotor Activity  Psychomotor Activity: Psychomotor Activity: Other (comment) (tremorous)   Assets  Assets: Communication Skills; Desire for Improvement; Housing; Resilience; Social Support   Sleep  Sleep: Sleep: Poor    Physical Exam: Physical Exam Vitals reviewed.  Constitutional:      Appearance: Normal appearance.  HENT:     Head: Normocephalic and atraumatic.  Eyes:     Extraocular Movements: Extraocular movements intact.     Pupils: Pupils are equal, round, and reactive to light.  Cardiovascular:     Rate and Rhythm: Normal rate and regular rhythm.  Pulmonary:     Effort: Pulmonary effort is normal.  Abdominal:     General: Abdomen is flat.  Musculoskeletal:     Cervical back: Normal range of motion.  Neurological:     General: No focal deficit present.     Mental Status: She is alert and oriented to person,  place, and time.    Review of Systems  Constitutional:  Negative for chills and fever.   HENT:  Negative for hearing loss.   Eyes:  Negative for blurred vision.  Respiratory:  Negative for cough.   Cardiovascular:  Negative for chest pain.  Gastrointestinal:  Negative for nausea.  Neurological:  Negative for tremors and headaches.  Psychiatric/Behavioral:  Positive for depression, hallucinations and suicidal ideas. Negative for substance abuse. The patient is nervous/anxious and has insomnia.    Blood pressure (!) 135/101, pulse (!) 121, temperature 99.6 F (37.6 C), temperature source Oral, SpO2 97 %. There is no height or weight on file to calculate BMI.  Treatment Plan Summary: Daily contact with patient to assess and evaluate symptoms and progress in treatment and Medication management  ASSESSMENT: Patient is seen and examined.  Patient is a 56 year old female with the psychiatric history of anxiety and medical history of HTN, who was admitted to [facility] with suicidal ideations secondary to auditory hallucinations commanding to kill self.  Impression: Major depressive disorder with psychosis. Generalized anxiety disorder.  PLAN: -inpatient psychiatric admission will be continued. -patient will be integrated in the milieu.   -patient will be encouraged to attend groups.   -15-minute checks;  -daily contact with patient to assess and evaluate symptoms and progress in treatment;  -psychoeducation -vital signs: q12 hours.  -precautions: suicide, elopement, and assault.   -Medications:  Psychiatric Problems: -CT head to r/o organic reasons of psychosis. Lab workup: UA, HIV, RPR, Vit B12, Folate, ceruloplasmin. -start Olanzapine 5mg  PO QHS for psychosis, depression, sleep. The risks/benefits/side-effects/alternatives to this medication were discussed in detail with the patient and time was given for questions. The patient consents to medication trial. - Metabolic profile and EKG monitoring obtained while on an atypical antipsychotic. -restart home Xanax at reduced dose  0.5mg  PO BID PRN anxiety. -continue Mirtazapine 7.5mg  PO QHS for depression, sleep.  - Encouraged patient to participate in unit milieu and in scheduled group therapies   Medical Problems: HTN Restart Clonidine 0.1mg  PO BID  Tobacco dependence -patient declined Nicotine patch   PRN medications: acetaminophen, alum & mag hydroxide-simeth, hydrOXYzine, OLANZapine zydis **AND** LORazepam **AND** ziprasidone, magnesium hydroxide  -Disposition will be determined after the patient is stabilized. -Social work and case management to assist with discharge planning and identification of hospital follow-up needs prior to discharge -Estimated LOS: 7 days -Discharge Concerns: Need to establish a safety plan; Medication compliance and effectiveness -Discharge Goals: Return home with outpatient referrals for mental health follow-up including medication management/psychotherapy    Observation Level/Precautions:  15 minute checks  Laboratory:   CMP  Psychotherapy:    Medications:    Consultations:    Discharge Concerns:    Estimated LOS:  Other:     Physician Treatment Plan for Primary Diagnosis: Depression Long Term Goal(s): Improvement in symptoms so as ready for discharge  Short Term Goals: Ability to identify changes in lifestyle to reduce recurrence of condition will improve, Ability to verbalize feelings will improve, Ability to disclose and discuss suicidal ideas, Ability to demonstrate self-control will improve, Ability to identify and develop effective coping behaviors will improve, Ability to maintain clinical measurements within normal limits will improve, Compliance with prescribed medications will improve, and Ability to identify triggers associated with substance abuse/mental health issues will improve  Physician Treatment Plan for Secondary Diagnosis: Principal Problem:   Depression  Long Term Goal(s): Improvement in symptoms so as ready for discharge  Short Term Goals: Ability  to identify changes  in lifestyle to reduce recurrence of condition will improve, Ability to verbalize feelings will improve, Ability to disclose and discuss suicidal ideas, Ability to demonstrate self-control will improve, Ability to identify and develop effective coping behaviors will improve, Ability to maintain clinical measurements within normal limits will improve, Compliance with prescribed medications will improve, and Ability to identify triggers associated with substance abuse/mental health issues will improve  I certify that inpatient services furnished can reasonably be expected to improve the patient's condition.    Larita Fife, MD 10/14/202312:00 PM

## 2022-04-12 NOTE — Progress Notes (Signed)
Adult Psychoeducational Group Note  Date:  04/12/2022 Time:  8:27 PM  Group Topic/Focus:  Wrap-Up Group:   The focus of this group is to help patients review their daily goal of treatment and discuss progress on daily workbooks.  Participation Level:  Did Not Attend  Participation Quality:   Did Not Attend  Affect:   Did Not Attend  Cognitive:   Did Not Attend  Insight: None  Engagement in Group:   Did Not Attend  Modes of Intervention:   Did Not Attend  Additional Comments:  Pt was encouraged to attend wrap up group but did not attend.  Candy Sledge 04/12/2022, 8:27 PM

## 2022-04-12 NOTE — Progress Notes (Signed)
   04/11/22 2100  Psych Admission Type (Psych Patients Only)  Admission Status Involuntary  Psychosocial Assessment  Patient Complaints Anxiety;Self-harm thoughts  Eye Contact Brief  Facial Expression Anxious  Affect Anxious;Appropriate to circumstance  Speech Logical/coherent  Interaction Assertive  Motor Activity Fidgety;Restless  Appearance/Hygiene Layered clothes  Behavior Characteristics Cooperative;Appropriate to situation  Mood Anxious;Preoccupied  Thought Process  Coherency WDL  Content Preoccupation  Delusions None reported or observed  Perception Hallucinations  Hallucination Auditory  Judgment Poor  Confusion None  Danger to Self  Current suicidal ideation? Active  Self-Injurious Behavior No self-injurious ideation or behavior indicators observed or expressed   Agreement Not to Harm Self Yes  Description of Agreement verbal contract  Danger to Others  Danger to Others None reported or observed

## 2022-04-12 NOTE — Progress Notes (Addendum)
Pt is A&OX4, anxious, denies suicidal ideations, denies homicidal ideations, denies  visual hallucinations. Pt admits to hearing voices tell her to kill herself. Pt verbally agrees to approach staff if these become apparent and before harming self or others. Pt denies experiencing nightmares. Mood and affect are congruent. Pt appetite is ok. No complaints of pain and/or discomfort at this time. Pt's memory appears to be grossly intact, and Pt hasn't displayed any injurious behaviors. Pt is medication compliant. There's no evidence of suicidal intent. Psychomotor activity was WNL. No s/s of Parkinson, Dystonia, Akathisia and/or Tardive Dyskinesia noted.

## 2022-04-12 NOTE — Progress Notes (Signed)
Pt c/o anxiety and auditory hallucinations. "I am hearing voices in my head telling me to kill myself." Pt has no intent on acting upon the voices.

## 2022-04-12 NOTE — Progress Notes (Signed)
   04/12/22 2224  Psych Admission Type (Psych Patients Only)  Admission Status Involuntary  Psychosocial Assessment  Patient Complaints None  Eye Contact Brief  Facial Expression Flat  Affect Appropriate to circumstance  Speech Logical/coherent  Interaction Isolative  Motor Activity Slow  Appearance/Hygiene Layered clothes  Behavior Characteristics Appropriate to situation;Calm;Guarded  Mood Depressed;Pleasant  Thought Process  Coherency WDL  Content Preoccupation  Delusions None reported or observed  Perception WDL  Hallucination Auditory  Judgment Poor  Confusion None  Danger to Self  Current suicidal ideation? Denies  Self-Injurious Behavior No self-injurious ideation or behavior indicators observed or expressed   Agreement Not to Harm Self Yes  Description of Agreement verbal contract  Danger to Others  Danger to Others None reported or observed

## 2022-04-12 NOTE — Group Note (Signed)
  [facility] LCSW Group Therapy Note  Date/Time:  04/12/2022 11:15AM-12:00PM  Type of Therapy and Topic:  Group Therapy:  Feelings About Hospitalization  Participation Level:  Did Not Attend   Description of Group This process group involved patients discussing their feelings related to being hospitalized, as well as the benefits they see to being in the hospital.  These feelings and benefits were itemized.  The group then brainstormed specific ways in which they could seek those same benefits when they discharge and return home.  Therapeutic Goals Patient will identify and describe positive and negative feelings related to hospitalization Patient will verbalize benefits of hospitalization to themselves personally Patients will brainstorm together ways they can obtain similar benefits in the outpatient setting, identify barriers to wellness and possible solutions  Summary of Patient Progress: Patient was invited to group, did not attend.   Therapeutic Modalities Cognitive Behavioral Therapy Motivational Interviewing    Kerstyn Coryell Grossman-Orr, LCSW 04/12/2022, 4:55 PM    

## 2022-04-12 NOTE — [facility] Suicide Risk Assessment (Signed)
[facility] Admission Suicide Risk Assessment   Nursing information obtained from:    Demographic factors:  Caucasian Current Mental Status:  Suicidal ideation indicated by patient Loss Factors:  Loss of significant relationship Historical Factors:  NA Risk Reduction Factors:  Living with another person, especially a relative  Total Time spent with patient: 30 minutes Principal Problem: Depression Diagnosis:  Principal Problem:   Depression  Subjective Data:  Ms. Neu is a 56yo F with past history of anxiety, who was admitted to [facility] due to depression, suicidal ideations and commanding auditory hallucinations.   Continued Clinical Symptoms:  Alcohol Use Disorder Identification Test Final Score (AUDIT): 0 The "Alcohol Use Disorders Identification Test", Guidelines for Use in Primary Care, Second Edition.  World Pharmacologist [facility]). Score between 0-7:  no or low risk or alcohol related problems. Score between 8-15:  moderate risk of alcohol related problems. Score between 16-19:  high risk of alcohol related problems. Score 20 or above:  warrants further diagnostic evaluation for alcohol dependence and treatment.   CLINICAL FACTORS:   Depression:   Severe    Physical Exam: Physical Exam ROS Blood pressure (!) 135/101, pulse (!) 121, temperature 99.6 F (37.6 C), temperature source Oral, SpO2 97 %. There is no height or weight on file to calculate BMI.   COGNITIVE FEATURES THAT CONTRIBUTE TO RISK:  None    SUICIDE RISK:   Moderate:  Frequent suicidal ideation with limited intensity, and duration, some specificity in terms of plans, no associated intent, good self-control, limited dysphoria/symptomatology, some risk factors present, and identifiable protective factors, including available and accessible social support.  PLAN OF CARE: see H&P for assessment and plan.  I certify that inpatient services furnished can reasonably be expected to improve the patient's condition.    Larita Fife, MD 04/12/2022, 12:58 PM

## 2022-04-13 ENCOUNTER — Inpatient Hospital Stay (HOSPITAL_COMMUNITY): Payer: Medicaid Other

## 2022-04-13 ENCOUNTER — Ambulatory Visit (HOSPITAL_COMMUNITY)
Admission: EM | Admit: 2022-04-13 | Discharge: 2022-04-13 | Disposition: A | Discharge status: Home / Self Care | Payer: Federal, State, Local not specified - Other

## 2022-04-13 DIAGNOSIS — F333 Major depressive disorder, recurrent, severe with psychotic symptoms: Secondary | ICD-10-CM | POA: Diagnosis not present

## 2022-04-13 LAB — FOLATE: Folate: 5.2 ng/mL — ABNORMAL LOW (ref >5.9)

## 2022-04-13 MED ORDER — FOLIC ACID 1 MG PO TABS
1.0000 mg | ORAL_TABLET | Freq: Every day | ORAL | Status: DC
  Administered 2022-04-13 – 2022-04-28 (×16): 1 mg via ORAL
  Filled 2022-04-13 (×16): qty 1

## 2022-04-13 MED ORDER — AMLODIPINE BESYLATE 5 MG PO TABS
5.0000 mg | ORAL_TABLET | Freq: Every day | ORAL | Status: DC
  Administered 2022-04-13 – 2022-04-15 (×3): 5 mg via ORAL
  Filled 2022-04-13 (×6): qty 1

## 2022-04-13 MED ORDER — OLANZAPINE 10 MG PO TABS
10.0000 mg | ORAL_TABLET | Freq: Every day | ORAL | Status: DC
  Administered 2022-04-13 – 2022-04-14 (×2): 10 mg via ORAL
  Filled 2022-04-13 (×4): qty 1

## 2022-04-13 NOTE — Progress Notes (Signed)
   04/13/22 1000  Psych Admission Type (Psych Patients Only)  Admission Status Involuntary  Psychosocial Assessment  Patient Complaints None  Eye Contact Brief  Facial Expression Flat  Affect Appropriate to circumstance  Speech Logical/coherent  Interaction Isolative  Motor Activity Slow  Appearance/Hygiene Layered clothes  Behavior Characteristics Appropriate to situation  Mood Depressed  Thought Process  Coherency WDL  Content Preoccupation  Delusions None reported or observed  Perception WDL  Hallucination None reported or observed  Judgment Poor  Confusion None  Danger to Self  Current suicidal ideation? Denies  Self-Injurious Behavior No self-injurious ideation or behavior indicators observed or expressed   Agreement Not to Harm Self Yes  Description of Agreement verbal  Danger to Others  Danger to Others None reported or observed

## 2022-04-13 NOTE — Group Note (Signed)
LCSW Group Therapy Note  04/13/2022      Type of Therapy and Topic:  Group Therapy: Gratitude  Participation Level:  Did Not Attend   Description of Group:   In this group, patients shared and discussed the importance of acknowledging the elements in their lives for which they are grateful and how this can positively impact their mood.  The group discussed how bringing the positive elements of their lives to the forefront of their minds can help with recovery from any illness, physical or mental.  An exercise was done as a group in which a list was made of gratitude items in order to encourage participants to consider other potential positives in their lives.  Therapeutic Goals: Patients will identify one or more item for which they are grateful in each of 6 categories:  people, experience, thing, place, skill, and other. Patients will discuss how it is possible to seek out gratitude in even bad situations. Patients will explore other possible items of gratitude that they could remember.   Summary of Patient Progress:  Patient was invited to group, did not attend.   Therapeutic Modalities:   Solution-Focused Therapy Activity  Jarrod Mcenery J Grossman-Orr, LCSW .  

## 2022-04-13 NOTE — Progress Notes (Signed)
Adult Psychoeducational Group Note  Date:  04/13/2022 Time:  8:29 PM  Group Topic/Focus:  Wrap-Up Group:   The focus of this group is to help patients review their daily goal of treatment and discuss progress on daily workbooks.  Participation Level:  Did Not Attend  Participation Quality:  Did Not Attend  Affect:  Did Not Attend  Cognitive:  Did Not Attend  Insight: Did Not Attend  Engagement in Group:  Did Not Attend  Modes of Intervention:  Did Not Attend  Additional Comments:   Pt was encouraged to attend group discussion but refused.   Gerhard Perches 04/13/2022, 8:29 PM

## 2022-04-13 NOTE — [facility] Counselor (Signed)
Adult Comprehensive Assessment  Patient ID: Marie Diaz, female   DOB: Aug 14, 1965, 56 y.o.   MRN: [id number]  Information Source: Information source: Patient  Current Stressors:  Patient states their primary concerns and needs for treatment are:: "Depression" Patient states their goals for this hospitilization and ongoing recovery are:: "I don't know.  I'm just hopeless." Educational / Learning stressors: Denies stressors Employment / Job issues: Denies stressors Family Relationships: States this is very stressful, but she cannot identify how family relationships stress her. Financial / Lack of resources (include bankruptcy): Not enough money. Housing / Lack of housing: Denies stressors Physical health (include injuries & life threatening diseases): Denies stressors Social relationships: Does not have friendships Substance abuse: Denies stressors Bereavement / Loss: Husband died suddenly of an accidental overdose 3 months ago.  Living/Environment/Situation:  Living Arrangements: Children Living conditions (as described by patient or guardian): Good Who else lives in the home?: Adult son How long has patient lived in current situation?: 3 months since husband died and therefore was no longer in the home What is atmosphere in current home: Comfortable, Supportive, Loving  Family History:  Marital status: Widowed Widowed, when?: 3 months ago Does patient have children?: Yes How many children?: 2 How is patient's relationship with their children?: Pt has two adult children and reports they are very supportive.  29yo daughter and 33yo son  Childhood History:  By whom was/is the patient raised?: Both parents Description of patient's relationship with caregiver when they were a child: Good with both parents Patient's description of current relationship with people who raised him/her: Parents are now divorced, but she still maintains a good relationship with both. How were you  disciplined when you got in trouble as a child/adolescent?: "I don't remember." Does patient have siblings?: No Did patient suffer any verbal/emotional/physical/sexual abuse as a child?: No Did patient suffer from severe childhood neglect?: No Has patient ever been sexually abused/assaulted/raped as an adolescent or adult?: No Was the patient ever a victim of a crime or a disaster?: No Witnessed domestic violence?: No Has patient been affected by domestic violence as an adult?: No  Education:  Highest grade of school patient has completed: GED Currently a Consulting civil engineer?: No  Employment/Work Situation:   Employment Situation: Unemployed Patient's Job has Been Impacted by Current Illness: No What is the Longest Time Patient has Held a Job?: 10 years 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?: No  Financial Resources:   Financial resources: No income Does patient have a Lawyer or guardian?: No  Alcohol/Substance Abuse:   What has been your use of drugs/alcohol within the last 12 months?: Denies all use Alcohol/Substance Abuse Treatment Hx: Denies past history Has alcohol/substance abuse ever caused legal problems?: No  Social Support System:   Conservation officer, nature Support System: Passenger transport manager Support System: Daughter, son, mother, father Type of faith/religion: None How does patient's faith help to cope with current illness?: N/A  Leisure/Recreation:   Do You Have Hobbies?: No ("Not anymore.")  Strengths/Needs:   What is the patient's perception of their strengths?: "I don't know.  I don't have any." Patient states they can use these personal strengths during their treatment to contribute to their recovery: N/A Patient states these barriers may affect/interfere with their treatment: Denied Patient states these barriers may affect their return to the community: Denied Other important information patient would  like considered in planning for their treatment: Denied  Discharge Plan:  Currently receiving community mental health services: No Patient states concerns and preferences for aftercare planning are: Patient is open to medication management and therapy Patient states they will know when they are safe and ready for discharge when: "I don't know." Does patient have access to transportation?: Yes Does patient have financial barriers related to discharge medications?: Yes Patient description of barriers related to discharge medications: No income or insurance Will patient be returning to same living situation after discharge?: Yes  Summary/Recommendations:   Summary and Recommendations (to be completed by the evaluator): Patient is a 56yo female hospitalized under IVC due to suicidal ideation with a plan to run into traffic.  She reports auditory hallucinations that are not command-type.  Her husband died of an accidental drug overdose 3 months ago.  They had been married for 33 years and she stated she is "totally lost" after his death.  She herself denies any drug or alcohol use.  She has no mental health providers, although her Primary Care Physician (Dr. Georgina Quint) has prescribed Xanax for PRN use.  She is open to medication management and therapy.  She gives consent to talk with either her adult son or adult daughter, who both live with her.  The patient would benefit from crisis stabilization, milieu participation, medication evaluation and management, group therapy, psychoeducation, safety monitoring, and discharge planning.  At discharge it is recommended that the patient adhere to the established aftercare plan.  Maretta Los. 04/13/2022

## 2022-04-13 NOTE — Progress Notes (Addendum)
[facility] MD Progress Note  04/13/2022 9:40 AM Marie Diaz  MRN:  [id number]  Principal Problem: Major depressive disorder, single episode with psychotic features ([place]) Diagnosis: Principal Problem:   Major depressive disorder, single episode with psychotic features [facility])  Ms. Dolin is a 56yo F with past history of anxiety, who was admitted to [facility] due to depression, suicidal ideations and commanding auditory hallucinations.  Interval History Patient was seen today for re-evaluation.  Nursing reports no events overnight. The patient has no issues with performing ADLs.  Patient has been medication compliant.    Patient was seen and interviewed by attending psychiatrist. Chart reviewed. Patient discussed during treatment team rounds.  Subjective:  On assessment patient reports feeling slightly better - repots partial improvement of auditory hallucinations "voices are less often, quieter", although still commanding her to hurt herself. She slept better last night too. She denies feeling suicidal today She reports no side effects or adverse reactions from initiated last night low-dose Zyprexa; she is agreeable to adjust the dose higher tonight. Her overall mood remains low, depressed and anxious. She is anhedonic, reports feeing homesick. Her BP remains high, she was apparently on Norvasc at home too in addition to Clonidine and it will be restarted today. She  denies feeling paranoid, unsafe, does not express any delusions. She denies thoughts or plans of hurting self (as mentioned above) or others. She denies any physical complaints, except of tiredness.  Labs: Folate low at 5.2. HIV, RPR, ceruloplasmin, B12 - pending.   Total Time spent with patient: 30 minutes  Past Psychiatric History: see H&P  Past Medical History:  Past Medical History:  Diagnosis Date   Anxiety    Depression    Gastric ulcer    Gastritis    Hypertension     Past Surgical History:  Procedure Laterality Date    ABDOMINAL HYSTERECTOMY     BIOPSY  08/29/2019   Procedure: BIOPSY;  Surgeon: Danie Binder, MD;  Location: [facility];  Service: Endoscopy;;   BIOPSY  02/26/2021   Procedure: BIOPSY;  Surgeon: Eloise Harman, DO;  Location: [facility];  Service: Endoscopy;;   CHOLECYSTECTOMY     ESOPHAGOGASTRODUODENOSCOPY N/A 08/29/2019   normal esophagus, localized moderate inflammation with adherent blood, congestion, erosions, and friability on greater curvature of stomach. Biopsy with chronic gastritis, negative H.pylori.    ESOPHAGOGASTRODUODENOSCOPY (EGD) WITH PROPOFOL N/A 02/26/2021   gastritis and pathology with reactive gastropathy and negative H.pylori   HEMORRHOID SURGERY     Family History:  Family History  Problem Relation Age of Onset   Colon cancer Neg Hx    Colon polyps Neg Hx    Family Psychiatric  History: see H&P  Social History:  Social History   Substance and Sexual Activity  Alcohol Use No     Social History   Substance and Sexual Activity  Drug Use No    Social History   Socioeconomic History   Marital status: Married    Spouse name: Not on file   Number of children: Not on file   Years of education: Not on file   Highest education level: Not on file  Occupational History   Not on file  Tobacco Use   Smoking status: Every Day    Packs/day: 0.50    Types: Cigarettes   Smokeless tobacco: Never  Vaping Use   Vaping Use: Former  Substance and Sexual Activity   Alcohol use: No   Drug use: No   Sexual  activity: Yes  Other Topics Concern   Not on file  Social History Narrative   [id number]), kids(2). 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 SHE'S AN ONLY CHILD.   Social Determinants of Health   Financial Resource Strain: Not on file  Food Insecurity: Not on file  Transportation Needs: Not on file  Physical Activity: Not on file  Stress: Not on file  Social Connections: Not on file   Additional  Social History:                         Sleep: Fair  Appetite:  Poor  Current Medications: Current Facility-Administered Medications  Medication Dose Route Frequency Provider Last Rate Last Admin   acetaminophen (TYLENOL) tablet 650 mg  650 mg Oral Q6H PRN Tharon Aquas, NP       ALPRAZolam Duanne Moron) tablet 0.5 mg  0.5 mg Oral BID PRN Larita Fife, MD   0.5 mg at 04/13/22 0607   alum & mag hydroxide-simeth (MAALOX/MYLANTA) 200-200-20 MG/5ML suspension 30 mL  30 mL Oral Q4H PRN Tharon Aquas, NP       amLODipine (NORVASC) tablet 5 mg  5 mg Oral Daily Terressa Evola, Delrae Rend, MD       cloNIDine (CATAPRES) tablet 0.1 mg  0.1 mg Oral BID Larita Fife, MD   0.1 mg at 04/13/22 0607   feeding supplement (ENSURE ENLIVE / ENSURE PLUS) liquid 237 mL  237 mL Oral BID BM Tharon Aquas, NP   237 mL at [id number]   folic acid (FOLVITE) tablet 1 mg  1 mg Oral Daily Cathleen Yagi, Delrae Rend, MD       hydrOXYzine (ATARAX) tablet 10 mg  10 mg Oral TID PRN Tharon Aquas, NP   10 mg at 04/13/22 0042   OLANZapine zydis (ZYPREXA) disintegrating tablet 5 mg  5 mg Oral Q8H PRN Tharon Aquas, NP   5 mg at 04/11/22 1657   And   LORazepam (ATIVAN) tablet 1 mg  1 mg Oral PRN Tharon Aquas, NP       And   ziprasidone (GEODON) injection 20 mg  20 mg Intramuscular PRN Tharon Aquas, NP       magnesium hydroxide (MILK OF MAGNESIA) suspension 30 mL  30 mL Oral Daily PRN Tharon Aquas, NP       mirtazapine (REMERON) tablet 7.5 mg  7.5 mg Oral QHS Tharon Aquas, NP   7.5 mg at 04/12/22 2053   OLANZapine (ZYPREXA) tablet 10 mg  10 mg Oral QHS Larita Fife, MD        Lab Results:  Results for orders placed or performed during the hospital encounter of 04/11/22 (from the past 48 hour(s))  Folate     Status: Abnormal   Collection Time: 04/13/22  6:39 AM  Result Value Ref Range   Folate 5.2 (L) >5.9 ng/mL    Comment: Performed at [facility], [place] [address] [place], [place] [address]    Blood Alcohol level:  Lab Results  Component Value Date   [facility] <10 04/11/2022   ETH <10 [id number]    Metabolic Disorder Labs: Lab Results  Component Value Date   HGBA1C 4.8 04/11/2022   MPG 91.06 04/11/2022   No results found for: "PROLACTIN" Lab Results  Component Value Date   CHOL 177 04/11/2022   TRIG 193 (H) 04/11/2022   HDL 41 04/11/2022  CHOLHDL 4.3 04/11/2022   VLDL 39 04/11/2022   LDLCALC 97 04/11/2022    Physical Findings: AIMS:  , ,  ,  ,    CIWA:    COWS:     Musculoskeletal: Strength & Muscle Tone: within normal limits Gait & Station: normal Patient leans: N/A  Psychiatric Specialty Exam: Appearance:  CF, appearing stated age,  wearing appropriate to the situation casual/hospital clothes, with fair grooming and hygiene. Normal level of alertness and appropriate facial expression.   Attitude/Behavior: calm, cooperative, engaging with appropriate eye contact.   Motor: WNL; dyskinesias not evident.    Speech: spontaneous, clear, coherent, normal comprehension.   Mood: "depressed ".   Affect: restricted.   Thought process: patient appears coherent, organized, logical, goal-directed, associations are appropriate, concrete but linear with questions   Thought content: patient reports suicidal thoughts, denies homicidal thoughts; did not express any delusions.   Thought perception: patient reports commanding auditory hallucinations to "kill yourself" and telling her to run in front of a car for the past 2 weeks. No visual hallucinations. Did not appear internally stimulated during the interview.   Cognition: patient is alert and oriented in self, place, date; with intact attention and concentration.   Insight: fair   Judgement: fair    Physical Exam: Physical Exam ROS Blood pressure (!) 164/117, pulse 85, temperature 99.6 F (37.6 C), temperature source Oral, SpO2 97 %. There is no height or weight on file to  calculate BMI.   Treatment Plan Summary: Daily contact with patient to assess and evaluate symptoms and progress in treatment and Medication management  Patient is a 56 year old female with the above-stated past psychiatric history who is seen in follow-up.  Chart reviewed. Patient discussed with nursing. Patient reports slight improvement of psychotic symptoms and sleep and no medication side effects after initiation of Zyprexa. The dose will be adjusted higher tonight for remaining psychosis and depression. Will continue psychosis workup as per the plan. Will restart home Norvasc for HTN.   Diagnoses/ Active problems: -Major depressive disorder with psychotic features. -Generalized anxiety disorder   PLAN:  Safety and Monitoring: continue inpatient psych admission; 15-minute checks; daily contact with patient to assess and evaluate symptoms and progress in treatment; psychoeducation.Vital signs: q12 hours. Precautions: suicide, elopement, and assault.   Psychiatric Problems: - increase Olanzapine (Zyprexa) to 10mg  PO QHS for psychosis and mood. - continue Mirtazapine 7.5mg  PO QHS for depression, sleep. - continue Xanax 0.5mg  PO BID PRN anxiety.   - CT head 10/15 - "stable and normal for age non contrast CT appearance of the brain." - Lab workup: UA, HIV, RPR, Vit B12, ceruloplasmin - pending. Folate - low - start Folic acid 1mg  PO daily. - Metabolic profile and EKG monitoring obtained while on an atypical antipsychotic. - Encouraged patient to participate in unit milieu and in scheduled group therapies   Medical Problems: HTN continue Clonidine 0.1mg  PO BID Restart Norvasc (amlodipine) 5mg  po daily  Folate deficiency start Folic acid 1mg  po daily   Tobacco dependence -patient declined Nicotine patch    PRN medications: acetaminophen, ALPRAZolam, alum & mag hydroxide-simeth, hydrOXYzine, OLANZapine zydis **AND** LORazepam **AND** ziprasidone, magnesium hydroxide  -Disposition  will be determined after the patient is stabilized. -Social work and case management to assist with discharge planning and identification of hospital follow-up needs prior to discharge -Estimated LOS: 5-7 days -Discharge Concerns: Need to establish a safety plan; Medication compliance and effectiveness -Discharge Goals: Return home with outpatient referrals for mental health follow-up including  medication management/psychotherapy     Total Time Spent in Direct Patient Care:  I personally spent 35 minutes on the unit in direct patient care. The direct patient care time included face-to-face time with the patient, reviewing the patient's chart, communicating with other professionals, and coordinating care. Greater than 50% of this time was spent in counseling or coordinating care with the patient regarding goals of hospitalization, psycho-education, and discharge planning needs.    Larita Fife, MD 04/13/2022, 9:40 AM

## 2022-04-14 ENCOUNTER — Encounter (HOSPITAL_COMMUNITY): Payer: Self-pay

## 2022-04-14 DIAGNOSIS — F323 Major depressive disorder, single episode, severe with psychotic features: Secondary | ICD-10-CM | POA: Diagnosis not present

## 2022-04-14 LAB — RPR: RPR Ser Ql: NONREACTIVE

## 2022-04-14 LAB — CERULOPLASMIN: Ceruloplasmin: 15.6 mg/dL — ABNORMAL LOW (ref 19.0–39.0)

## 2022-04-14 LAB — HIV ANTIBODY (ROUTINE TESTING W REFLEX): HIV Screen 4th Generation wRfx: NONREACTIVE

## 2022-04-14 MED ORDER — POTASSIUM CHLORIDE CRYS ER 20 MEQ PO TBCR
40.0000 meq | EXTENDED_RELEASE_TABLET | Freq: Once | ORAL | Status: AC
  Administered 2022-04-14: 40 meq via ORAL
  Filled 2022-04-14 (×2): qty 2

## 2022-04-14 NOTE — Progress Notes (Signed)
   04/14/22 2015  Vital Signs  Pulse Rate 86  Pulse Rate Source Monitor  BP (!) 157/103  BP Location Right Arm  BP Method Automatic  Patient Position (if appropriate) Standing  Oxygen Therapy  SpO2 99 %   BP elevated. Patient is anxious and rates her anxiety a 7# on 1-10# scale with 10# being the most.  Support given Ativan p.o. Patient teaching. Denies s.I.

## 2022-04-14 NOTE — Progress Notes (Signed)
Patient has complained of anxiety this shift and has received prn medications which she has reported has provided no relief. Patient states her anxiety is an 8/10. Patient has had no dyscontrol this shift.   Assess patient for safety, offer medications as prescribed, engage patient in 1:1 staff talks.   Patient able to contract for safety. Continue to monitor as planned.

## 2022-04-14 NOTE — Progress Notes (Signed)
   04/14/22 2148  Vital Signs  Pulse Rate 74  BP 119/73  BP Location Left Arm  BP Method Automatic  Patient Position (if appropriate) Lying   BP and anxiety decreased after Ativan.

## 2022-04-14 NOTE — Group Note (Signed)
Recreation Therapy Group Note   Group Topic:Coping Skills  Group Date: 04/14/2022 Start Time: 0093 End Time: 1050 Facilitators: Celia Gibbons-McCall, LRT,CTRS Location: [address] Dayroom   Goal Area(s) Addresses: Patient will define what a coping skill is. Patient will create a list of healthy coping skills beginning with each letter of the alphabet. Patient will successfully identify positive coping skills they can use post d/c.  Patient will acknowledge benefit(s) of using learned coping skills post d/c.  Group Description: Coping A to Z. Patient asked to identify what a coping skill is and when they use them. Patients with Probation officer discussed healthy versus unhealthy coping skills. Next patients were given a blank worksheet titled "Coping Skills A-Z". Partners were instructed to come up with at least one positive coping skill per letter of the alphabet, addressing a specific challenge (ex: stress, anger, anxiety, depression, grief, doubt, isolation, self-harm/suicidal thoughts, substance use). Patients were given 15 minutes to brainstorm, before ideas were presented to the large group. Patients and LRT debriefed on the importance of coping skill selection based on situation and back-up plans when a skill tried is not effective. At the end of group, patients were given an handout of alphabetized strategies to keep for future reference.   Affect/Mood: N/A   Participation Level: Did not attend    Clinical Observations/Individualized Feedback:     Plan: Continue to engage patient in RT group sessions 2-3x/week.   Khyle Goodell-McCall, LRT,CTRS 04/14/2022 2:00 PM

## 2022-04-14 NOTE — BHH Group Notes (Signed)
Patient did not attend the Wrap-up group. 

## 2022-04-14 NOTE — Progress Notes (Signed)
Adult Psychoeducational Group Note  Date:  04/14/2022 Time:  12:53 PM  Group Topic/Focus:  Orientation:   The focus of this group is to educate the patient on the purpose and policies of crisis stabilization and provide a format to answer questions about their admission.  The group details unit policies and expectations of patients while admitted.  Participation Level:  Active  Participation Quality:  Appropriate  Affect:  Appropriate  Cognitive:  Appropriate  Insight: Appropriate  Engagement in Group:  Engaged  Modes of Intervention:  Discussion  Additional Comments:  Pt attended the orientation group and remained appropriate and engaged throughout the duration of the group.   Beryle Beams 04/14/2022, 12:53 PM

## 2022-04-14 NOTE — BH IP Treatment Plan (Signed)
Interdisciplinary Treatment and Diagnostic Plan Update  04/14/2022 Time of Session: 10:05am Marie Diaz MRN: [id number]  Principal Diagnosis: Major depressive disorder, single episode with psychotic features [facility])  Secondary Diagnoses: Principal Problem:   Major depressive disorder, single episode with psychotic features ([place])   Current Medications:  Current Facility-Administered Medications  Medication Dose Route Frequency Provider Last Rate Last Admin   acetaminophen (TYLENOL) tablet 650 mg  650 mg Oral Q6H PRN Tharon Aquas, NP       ALPRAZolam Duanne Moron) tablet 0.5 mg  0.5 mg Oral BID PRN Larita Fife, MD   0.5 mg at 04/14/22 0754   alum & mag hydroxide-simeth (MAALOX/MYLANTA) 200-200-20 MG/5ML suspension 30 mL  30 mL Oral Q4H PRN Tharon Aquas, NP       amLODipine (NORVASC) tablet 5 mg  5 mg Oral Daily Larita Fife, MD   5 mg at 04/14/22 0751   cloNIDine (CATAPRES) tablet 0.1 mg  0.1 mg Oral BID Larita Fife, MD   0.1 mg at 04/14/22 0606   feeding supplement (ENSURE ENLIVE / ENSURE PLUS) liquid 237 mL  237 mL Oral BID BM Tharon Aquas, NP   237 mL at 40/98/11 9147   folic acid (FOLVITE) tablet 1 mg  1 mg Oral Daily Larita Fife, MD   1 mg at 04/14/22 0752   hydrOXYzine (ATARAX) tablet 10 mg  10 mg Oral TID PRN Tharon Aquas, NP   10 mg at 04/14/22 0959   OLANZapine zydis (ZYPREXA) disintegrating tablet 5 mg  5 mg Oral Q8H PRN Tharon Aquas, NP   5 mg at 04/13/22 8295   And   LORazepam (ATIVAN) tablet 1 mg  1 mg Oral PRN Tharon Aquas, NP       And   ziprasidone (GEODON) injection 20 mg  20 mg Intramuscular PRN Tharon Aquas, NP       magnesium hydroxide (MILK OF MAGNESIA) suspension 30 mL  30 mL Oral Daily PRN Tharon Aquas, NP       mirtazapine (REMERON) tablet 7.5 mg  7.5 mg Oral QHS Tharon Aquas, NP   7.5 mg at 04/13/22 2036   OLANZapine (ZYPREXA) tablet 10 mg  10 mg Oral QHS Paliy, Delrae Rend, MD   10 mg at 04/13/22 2036    PTA Medications: Medications Prior to Admission  Medication Sig Dispense Refill Last Dose   ALPRAZolam (XANAX) 1 MG tablet Take 1 mg by mouth 3 (three) times daily.      cloNIDine (CATAPRES) 0.2 MG tablet Take 0.5 tablets (0.1 mg total) by mouth 2 (two) times daily. (Patient taking differently: Take 0.2 mg by mouth 2 (two) times daily.)       Patient Stressors: Loss of husband    Patient Strengths: Average or above average intelligence   Treatment Modalities: Medication Management, Group therapy, Case management,  1 to 1 session with clinician, Psychoeducation, Recreational therapy.   Physician Treatment Plan for Primary Diagnosis: Major depressive disorder, single episode with psychotic features ([place]) Long Term Goal(s): Improvement in symptoms so as ready for discharge   Short Term Goals: Ability to identify changes in lifestyle to reduce recurrence of condition will improve Ability to verbalize feelings will improve Ability to disclose and discuss suicidal ideas Ability to demonstrate self-control will improve Ability to identify and develop effective coping behaviors will improve Ability to maintain clinical measurements within normal limits will improve Compliance with prescribed medications will improve Ability to identify triggers associated with substance abuse/mental  health issues will improve  Medication Management: Evaluate patient's response, side effects, and tolerance of medication regimen.  Therapeutic Interventions: 1 to 1 sessions, Unit Group sessions and Medication administration.  Evaluation of Outcomes: Not Met  Physician Treatment Plan for Secondary Diagnosis: Principal Problem:   Major depressive disorder, single episode with psychotic features ([place])  Long Term Goal(s): Improvement in symptoms so as ready for discharge   Short Term Goals: Ability to identify changes in lifestyle to reduce recurrence of condition will improve Ability to verbalize  feelings will improve Ability to disclose and discuss suicidal ideas Ability to demonstrate self-control will improve Ability to identify and develop effective coping behaviors will improve Ability to maintain clinical measurements within normal limits will improve Compliance with prescribed medications will improve Ability to identify triggers associated with substance abuse/mental health issues will improve     Medication Management: Evaluate patient's response, side effects, and tolerance of medication regimen.  Therapeutic Interventions: 1 to 1 sessions, Unit Group sessions and Medication administration.  Evaluation of Outcomes: Not Met   RN Treatment Plan for Primary Diagnosis: Major depressive disorder, single episode with psychotic features ([place]) Long Term Goal(s): Knowledge of disease and therapeutic regimen to maintain health will improve and    Short Term Goals: Ability to remain free from injury will improve, Ability to verbalize frustration and anger appropriately will improve, Ability to demonstrate self-control, Ability to participate in decision making will improve, Ability to verbalize feelings will improve, Ability to disclose and discuss suicidal ideas, Ability to identify and develop effective coping behaviors will improve, and Compliance with prescribed medications will improve  Medication Management: RN will administer medications as ordered by provider, will assess and evaluate patient's response and provide education to patient for prescribed medication. RN will report any adverse and/or side effects to prescribing provider.  Therapeutic Interventions: 1 on 1 counseling sessions, Psychoeducation, Medication administration, Evaluate responses to treatment, Monitor vital signs and CBGs as ordered, Perform/monitor CIWA, COWS, AIMS and Fall Risk screenings as ordered, Perform wound care treatments as ordered.  Evaluation of Outcomes: Not Met   LCSW Treatment Plan for  Primary Diagnosis: Major depressive disorder, single episode with psychotic features ([place]) Long Term Goal(s): Safe transition to appropriate next level of care at discharge, Engage patient in therapeutic group addressing interpersonal concerns.  Short Term Goals: Engage patient in aftercare planning with referrals and resources, Increase social support, Increase ability to appropriately verbalize feelings, Increase emotional regulation, Facilitate acceptance of mental health diagnosis and concerns, Facilitate patient progression through stages of change regarding substance use diagnoses and concerns, Identify triggers associated with mental health/substance abuse issues, and Increase skills for wellness and recovery  Therapeutic Interventions: Assess for all discharge needs, 1 to 1 time with Social worker, Explore available resources and support systems, Assess for adequacy in community support network, Educate family and significant other(s) on suicide prevention, Complete Psychosocial Assessment, Interpersonal group therapy.  Evaluation of Outcomes: Not Met   Progress in Treatment: Attending groups: No. Participating in groups: No. Taking medication as prescribed: Yes. Toleration medication: Yes. Family/Significant other contact made: No, will contact:  daughter or son. Patient understands diagnosis: Yes. Discussing patient identified problems/goals with staff: Yes. Medical problems stabilized or resolved: Yes. Denies suicidal/homicidal ideation: Yes. Issues/concerns per patient self-inventory: No.  New problem(s) identified: No, Describe:  none reported   New Short Term/Long Term Goal(s):   medication stabilization, elimination of SI thoughts, development of comprehensive mental wellness plan.   Patient Goals:  Pt states, "I want  my brain to work better and have more clarity"  Discharge Plan or Barriers: Patient recently admitted. CSW will continue to follow and assess for  appropriate referrals and possible discharge planning.    Reason for Continuation of Hospitalization: Anxiety Delusions  Depression Hallucinations Medication stabilization  Estimated Length of Stay: 5-10 days  Last 3 [place] Suicide Severity Risk Score: Flowsheet Row Admission (Current) from 04/11/2022 in [place] [facility] Most recent reading at 04/11/2022  6:00 PM ED from 04/11/2022 in [facility] Most recent reading at 04/11/2022 11:51 AM ED from 03/16/2022 in [place] Most recent reading at 03/16/2022 12:55 AM  C-SSRS RISK CATEGORY High Risk High Risk No Risk       Last PHQ 2/9 Scores:     No data to display          Scribe for Treatment Team: Zachery Conch, LCSW 04/14/2022 2:49 PM

## 2022-04-14 NOTE — Progress Notes (Signed)
   04/13/22 2100  Psych Admission Type (Psych Patients Only)  Admission Status Involuntary  Psychosocial Assessment  Patient Complaints Isolation  Eye Contact Brief  Facial Expression Flat;Sad  Affect Appropriate to circumstance  Speech Logical/coherent  Interaction Minimal  Motor Activity Slow  Appearance/Hygiene Unremarkable  Behavior Characteristics Appropriate to situation;Guarded  Mood Depressed  Thought Process  Coherency WDL  Content Preoccupation  Delusions None reported or observed  Perception WDL  Hallucination None reported or observed  Judgment Poor  Confusion None  Danger to Self  Current suicidal ideation? Denies  Self-Injurious Behavior No self-injurious ideation or behavior indicators observed or expressed   Agreement Not to Harm Self Yes  Description of Agreement verbal  Danger to Others  Danger to Others None reported or observed

## 2022-04-14 NOTE — Group Note (Signed)
LCSW Group Therapy Note  Group Date: 04/14/2022 Start Time: 1300 End Time: 1330   Type of Therapy and Topic:  Group Therapy - How To Cope with Nervousness about Discharge   Participation Level:  Did Not Attend   Description of Group This process group involved identification of patients' feelings about discharge. Some of them are scheduled to be discharged soon, while others are new admissions, but each of them was asked to share thoughts and feelings surrounding discharge from the hospital. One common theme was that they are excited at the prospect of going home, while another was that many of them are apprehensive about sharing why they were hospitalized. Patients were given the opportunity to discuss these feelings with their peers in preparation for discharge.  Therapeutic Goals  Patient will identify their overall feelings about pending discharge. Patient will think about how they might proactively address issues that they believe will once again arise once they get home (i.e. with parents). Patients will participate in discussion about having hope for change.   Summary of Patient Progress:  Did not attend   Therapeutic Modalities Cognitive Behavioral Therapy   Marie Bache E Antwoine Zorn, LCSW 04/14/2022  1:23 PM   

## 2022-04-14 NOTE — Progress Notes (Signed)
[facility] MD Progress Note  04/14/2022 6:08 PM Marie Diaz  MRN:  [id number] Subjective:   Marie Diaz is a 56 yr old female who presented to [facility] on 10/13 due to depression, SI, and command auditory hallucinations, she was admitted to [facility] on 10/14.  PPHx is significant for Anxiety and no history of Suicide Attempts, Self Injurious Behavior, or Psychiatric Hospitalizations.    Case was discussed in the multidisciplinary team. MAR was reviewed and patient was compliant with medications.  She received PRN Xanax x2, Atarax, and Zyprexa.   Psychiatric Team made the following recommendations yesterday: -Increase Zyprexa to 10 mg QHS -Start Folic Acid 1 mg daily -Restart Amlodipine 5 mg daily    On interview today patient reports she slept poor last night.  She reports her appetite is doing fair.  She reports no SI, HI, or AVH.  She reports no Paranoia, Ideas of Reference, or other First Rank symptoms.  She reports no issues with her medications.  She reports that she does not feel like there has been any improvement.  She then states that she wants to be discharged tomorrow and when asked why she says this she reports she just has a feeling.  Discussed with her that since her Zyprexa was just increased last night we would see her response to it today.  Discussed that if she continues to not have any improvement we may further increase her Remeron.  She reports some mild headache and mild weakness today.  She reports no other concerns at present.  Principal Problem: Major depressive disorder, single episode with psychotic features (HCC) Diagnosis: Principal Problem:   Major depressive disorder, single episode with psychotic features (HCC)  Total Time spent with patient:  I personally spent 25 minutes on the unit in direct patient care. The direct patient care time included face-to-face time with the patient, reviewing the patient's chart, communicating with other professionals, and  coordinating care. Greater than 50% of this time was spent in counseling or coordinating care with the patient regarding goals of hospitalization, psycho-education, and discharge planning needs.   Past Psychiatric History: Anxiety and no history of Suicide Attempts, Self Injurious Behavior, or Psychiatric Hospitalizations.   Past Medical History:  Past Medical History:  Diagnosis Date   Anxiety    Depression    Gastric ulcer    Gastritis    Hypertension     Past Surgical History:  Procedure Laterality Date   ABDOMINAL HYSTERECTOMY     BIOPSY  08/29/2019   Procedure: BIOPSY;  Surgeon: West Bali, MD;  Location: [facility];  Service: Endoscopy;;   BIOPSY  02/26/2021   Procedure: BIOPSY;  Surgeon: Lanelle Bal, DO;  Location: [facility];  Service: Endoscopy;;   CHOLECYSTECTOMY     ESOPHAGOGASTRODUODENOSCOPY N/A 08/29/2019   normal esophagus, localized moderate inflammation with adherent blood, congestion, erosions, and friability on greater curvature of stomach. Biopsy with chronic gastritis, negative H.pylori.    ESOPHAGOGASTRODUODENOSCOPY (EGD) WITH PROPOFOL N/A 02/26/2021   gastritis and pathology with reactive gastropathy and negative H.pylori   HEMORRHOID SURGERY     Family History:  Family History  Problem Relation Age of Onset   Colon cancer Neg Hx    Colon polyps Neg Hx    Family Psychiatric  History: Reports None Social History:  Social History   Substance and Sexual Activity  Alcohol Use No     Social History   Substance and Sexual Activity  Drug Use No  Social History   Socioeconomic History   Marital status: Married    Spouse name: Not on file   Number of children: Not on file   Years of education: Not on file   Highest education level: Not on file  Occupational History   Not on file  Tobacco Use   Smoking status: Every Day    Packs/day: 0.50    Types: Cigarettes   Smokeless tobacco: Never  Vaping Use   Vaping Use: Former   Substance and Sexual Activity   Alcohol use: No   Drug use: No   Sexual activity: Yes  Other Topics Concern   Not on file  Social History Narrative   [phone]), kids(2). 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 SHE'S AN ONLY CHILD.   Social Determinants of Health   Financial Resource Strain: Not on file  Food Insecurity: Not on file  Transportation Needs: Not on file  Physical Activity: Not on file  Stress: Not on file  Social Connections: Not on file   Additional Social History:                         Sleep: Poor  Appetite:  Fair  Current Medications: Current Facility-Administered Medications  Medication Dose Route Frequency Provider Last Rate Last Admin   acetaminophen (TYLENOL) tablet 650 mg  650 mg Oral Q6H PRN Lauree Chandler, NP       ALPRAZolam Prudy Feeler) tablet 0.5 mg  0.5 mg Oral BID PRN Thalia Party, MD   0.5 mg at 04/14/22 0754   alum & mag hydroxide-simeth (MAALOX/MYLANTA) 200-200-20 MG/5ML suspension 30 mL  30 mL Oral Q4H PRN Lauree Chandler, NP       amLODipine (NORVASC) tablet 5 mg  5 mg Oral Daily Thalia Party, MD   5 mg at 04/14/22 0751   cloNIDine (CATAPRES) tablet 0.1 mg  0.1 mg Oral BID Thalia Party, MD   0.1 mg at 04/14/22 0606   feeding supplement (ENSURE ENLIVE / ENSURE PLUS) liquid 237 mL  237 mL Oral BID BM Lauree Chandler, NP   237 mL at 04/13/22 1406   folic acid (FOLVITE) tablet 1 mg  1 mg Oral Daily Thalia Party, MD   1 mg at 04/14/22 0752   hydrOXYzine (ATARAX) tablet 10 mg  10 mg Oral TID PRN Lauree Chandler, NP   10 mg at 04/14/22 0959   OLANZapine zydis (ZYPREXA) disintegrating tablet 5 mg  5 mg Oral Q8H PRN Lauree Chandler, NP   5 mg at 04/13/22 7858   And   LORazepam (ATIVAN) tablet 1 mg  1 mg Oral PRN Lauree Chandler, NP       And   ziprasidone (GEODON) injection 20 mg  20 mg Intramuscular PRN Lauree Chandler, NP       magnesium hydroxide (MILK OF  MAGNESIA) suspension 30 mL  30 mL Oral Daily PRN Lauree Chandler, NP       mirtazapine (REMERON) tablet 7.5 mg  7.5 mg Oral QHS Lauree Chandler, NP   7.5 mg at 04/13/22 2036   OLANZapine (ZYPREXA) tablet 10 mg  10 mg Oral QHS Thalia Party, MD   10 mg at 04/13/22 2036    Lab Results:  Results for orders placed or performed during the hospital encounter of 04/11/22 (from the past 48 hour(s))  RPR     Status: None  Collection Time: 04/13/22  6:39 AM  Result Value Ref Range   RPR Ser Ql NON REACTIVE NON REACTIVE    Comment: Performed at [facility], [address], [place], [place] [address]  Folate     Status: Abnormal   Collection Time: 04/13/22  6:39 AM  Result Value Ref Range   Folate 5.2 (L) >5.9 ng/mL    Comment: Performed at [facility], [address], [place], [place] [address]  Ceruloplasmin     Status: Abnormal   Collection Time: 04/13/22  6:39 AM  Result Value Ref Range   Ceruloplasmin 15.6 (L) 19.0 - 39.0 mg/dL    Comment: (NOTE) Performed At: [facility] [address] [place], [place] [id number] Jolene Schimke MD ME:[id number]   HIV Antibody (routine testing w rflx)     Status: None   Collection Time: 04/14/22  6:23 AM  Result Value Ref Range   HIV Screen 4th Generation wRfx Non Reactive Non Reactive    Comment: Performed at [facility], [address], [place], [place] [address]    Blood Alcohol level:  Lab Results  Component Value Date   ETH <10 04/11/2022   ETH <10 06/11/2019    Metabolic Disorder Labs: Lab Results  Component Value Date   HGBA1C 4.8 04/11/2022   MPG 91.06 04/11/2022   No results found for: "PROLACTIN" Lab Results  Component Value Date   CHOL 177 04/11/2022   TRIG 193 (H) 04/11/2022   HDL 41 04/11/2022   CHOLHDL 4.3 04/11/2022   VLDL 39 04/11/2022   LDLCALC 97 04/11/2022    Physical Findings: AIMS:  , ,  ,  ,    CIWA:    COWS:     Musculoskeletal: Strength & Muscle Tone:  decreased Gait & Station: normal Patient leans: N/A  Psychiatric Specialty Exam:  Presentation  General Appearance:  Appropriate for Environment; Casual  Eye Contact: Fair  Speech: Clear and Coherent; Slow  Speech Volume: Decreased  Handedness:No data recorded  Mood and Affect  Mood: Anxious; Depressed  Affect: Congruent; Flat   Thought Process  Thought Processes: Coherent  Descriptions of Associations:Intact  Orientation:Full (Time, Place and Person)  Thought Content:Logical  History of Schizophrenia/Schizoaffective disorder:No  Duration of Psychotic Symptoms:Less than six months  Hallucinations:Hallucinations: None  Ideas of Reference:None  Suicidal Thoughts:Suicidal Thoughts: No  Homicidal Thoughts:Homicidal Thoughts: No   Sensorium  Memory: Immediate Fair; Recent Fair  Judgment: Poor  Insight: Poor   Executive Functions  Concentration: Fair  Attention Span: Fair  Recall: Fair  Fund of Knowledge: Fair  Language: Fair   Psychomotor Activity  Psychomotor Activity:Psychomotor Activity: Tremor   Assets  Assets: Manufacturing systems engineer; Desire for Improvement; Resilience; Housing; Social Support   Sleep  Sleep:Sleep: Poor    Physical Exam: Physical Exam Vitals and nursing note reviewed.  Constitutional:      General: She is not in acute distress.    Appearance: Normal appearance. She is normal weight. She is ill-appearing. She is not toxic-appearing.  HENT:     Head: Normocephalic and atraumatic.  Pulmonary:     Effort: Pulmonary effort is normal.  Musculoskeletal:        General: Normal range of motion.  Neurological:     Mental Status: She is alert.    Review of Systems  Respiratory:  Negative for cough and shortness of breath.   Cardiovascular:  Negative for chest pain.  Gastrointestinal:  Negative for abdominal pain, constipation, diarrhea, nausea and  vomiting.  Neurological:  Positive for weakness (mild)  and headaches (mild). Negative for dizziness.  Psychiatric/Behavioral:  Positive for depression. Negative for hallucinations and suicidal ideas. The patient is not nervous/anxious.    Blood pressure (!) 140/81, pulse 72, temperature 97.9 F (36.6 C), temperature source Oral, SpO2 99 %. There is no height or weight on file to calculate BMI.   Treatment Plan Summary: Daily contact with patient to assess and evaluate symptoms and progress in treatment and Medication management  Marie Diaz is a 56 yr old female who presented to [facility] on 10/13 due to depression, SI, and command auditory hallucinations, she was admitted to [facility] on 10/14.  PPHx is significant for Anxiety and no history of Suicide Attempts, Self Injurious Behavior, or Psychiatric Hospitalizations.    Marie Diaz is reporting no improvement and was still in bed when interviewed at 10 AM.  Since her Zyprexa was just increased we do not want to overly sedate her by increasing her Remeron.  If she is more active today/tomorrow could consider increasing her Remeron tomorrow evening.  If she continues to be sedated may consider changing Zyprexa for another antipsychotic, however, it has been effective at eliminating her AVH.  She did have hypokalemia on admission so will order a one time dose of Kdur and will recheck a CMP tomorrow morning.  We will continue to monitor.    MDD, Recurrent, Severe, w/Psychosis: -Continue Remeron 7.5 mg QHS for depression -Continue Zyprexa 10 mg QHS for psychosis -Continue Xanax 0.5 mg BID PRN for anxiety -Continue Agitation Protocol: Zyprexa/Ativan/Geodon   HTN: -Continue Amlodipine 5 mg daily -Continue Clonidine 0.1 mg BID   Folate Deficiency: -Continue Folic Acid 1 mg daily   Hypokalemia: -One time dose Kdur 40 mEq -Redraw CMP tomorrow morning   -Continue Ensure 237 mg BID -Continue PRN's: Tylenol, Maalox, Atarax, Milk of Magnesia   Labs: 10/13- CMP: WNL except for K: 3.0,  BUN < 5,  Total  Protein: 6.4,  CBC: WNL except WBC: 11.8,  RBC: 5.27,  Hem: 16.7,  MCHC: 36.5,  Neut Abs: 8.4, Mono Abs: 1.1, Lipid Panel: WNL except Trig: 193,  A1c: 4.8, TSH: 1.902,  UDS: Benzo pos,  EtOH: Neg,  EKG: Sinus Tach with Qtc: 439 10/14- UA: WNL 10/15-  RPR: Neg,  Folate: 5.2,  Ceruloplasmin: 15.6,  Awaiting HIV   Marie Cedar, MD 04/14/2022, 6:08 PM

## 2022-04-14 NOTE — Progress Notes (Signed)
Recreation Therapy Notes  INPATIENT RECREATION THERAPY ASSESSMENT  Patient Details Name: VERLAINE EMBRY MRN: [id number] DOB: 12-13-1965 Today's Date: 04/14/2022       Information Obtained From: Patient  Able to Participate in Assessment/Interview: Yes  Patient Presentation: Alert  Reason for Admission (Per Patient): Suicidal Ideation, Other (Comments) (per chart: depression, anxiety)  Patient Stressors: Family  Coping Skills:   Isolation, TV, Music, Deep Breathing, Talk, Prayer, Avoidance, Hot Bath/Shower  Leisure Interests (2+):  Community - Travel (Comment) (Ride around)  Frequency of Recreation/Participation: Weekly  Awareness of Community Resources:  Yes  Community Resources:  Engineer, drilling, [place], Art therapist  Current Use: Yes  If no, Barriers?:    Expressed Interest in [place]: No  [employer] of Residence:  [place]  Patient Main Form of Transportation: Musician  Patient Strengths:  "I don't have none right now"  Patient Identified Areas of Improvement:  "yeah, a whole lot"  Patient Goal for Hospitalization:  "to get me some help so I can go home"  Current SI (including self-harm):  No  Current HI:  No  Current AVH: No  Staff Intervention Plan: Group Attendance, Collaborate with Interdisciplinary Treatment Team  Consent to Intern Participation: N/A   Mardi Cannady-McCall, LRT,CTRS Numa Schroeter A Raider Valbuena-McCall 04/14/2022, 3:00 PM

## 2022-04-15 DIAGNOSIS — F323 Major depressive disorder, single episode, severe with psychotic features: Secondary | ICD-10-CM | POA: Diagnosis not present

## 2022-04-15 LAB — COMPREHENSIVE METABOLIC PANEL
ALT: 16 U/L (ref 0–44)
AST: 16 U/L (ref 15–41)
Albumin: 3.7 g/dL (ref 3.5–5.0)
Alkaline Phosphatase: 51 U/L (ref 38–126)
Anion gap: 6 (ref 5–15)
BUN: 20 mg/dL (ref 6–20)
CO2: 27 mmol/L (ref 22–32)
Calcium: 8.6 mg/dL — ABNORMAL LOW (ref 8.9–10.3)
Chloride: 110 mmol/L (ref 98–111)
Creatinine, Ser: 0.58 mg/dL (ref 0.44–1.00)
GFR, Estimated: >60 mL/min (ref >60)
Glucose, Bld: 123 mg/dL — ABNORMAL HIGH (ref 70–99)
Potassium: 4.1 mmol/L (ref 3.5–5.1)
Sodium: 143 mmol/L (ref 135–145)
Total Bilirubin: 0.6 mg/dL (ref 0.3–1.2)
Total Protein: 6.4 g/dL — ABNORMAL LOW (ref 6.5–8.1)

## 2022-04-15 MED ORDER — OLANZAPINE 7.5 MG PO TABS
15.0000 mg | ORAL_TABLET | Freq: Every day | ORAL | Status: DC
  Administered 2022-04-15 – 2022-04-16 (×2): 15 mg via ORAL
  Filled 2022-04-15 (×3): qty 2

## 2022-04-15 MED ORDER — AMLODIPINE BESYLATE 10 MG PO TABS
10.0000 mg | ORAL_TABLET | Freq: Every day | ORAL | Status: DC
  Administered 2022-04-16 – 2022-04-28 (×13): 10 mg via ORAL
  Filled 2022-04-15 (×14): qty 1

## 2022-04-15 MED ORDER — AMLODIPINE BESYLATE 5 MG PO TABS
5.0000 mg | ORAL_TABLET | Freq: Once | ORAL | Status: AC
  Administered 2022-04-15: 5 mg via ORAL
  Filled 2022-04-15: qty 1

## 2022-04-15 MED ORDER — HYDROXYZINE HCL 25 MG PO TABS
25.0000 mg | ORAL_TABLET | Freq: Three times a day (TID) | ORAL | Status: DC | PRN
  Administered 2022-04-15 – 2022-04-28 (×14): 25 mg via ORAL
  Filled 2022-04-15 (×8): qty 1
  Filled 2022-04-15: qty 10
  Filled 2022-04-15 (×6): qty 1

## 2022-04-15 MED ORDER — DOCUSATE SODIUM 100 MG PO CAPS
100.0000 mg | ORAL_CAPSULE | Freq: Every day | ORAL | Status: DC
  Administered 2022-04-15 – 2022-04-25 (×11): 100 mg via ORAL
  Filled 2022-04-15 (×13): qty 1

## 2022-04-15 MED ORDER — MIRTAZAPINE 15 MG PO TABS
15.0000 mg | ORAL_TABLET | Freq: Every day | ORAL | Status: DC
  Administered 2022-04-15 – 2022-04-16 (×2): 15 mg via ORAL
  Filled 2022-04-15 (×5): qty 1

## 2022-04-15 MED ORDER — FAMOTIDINE 20 MG PO TABS
20.0000 mg | ORAL_TABLET | Freq: Every day | ORAL | Status: DC
  Administered 2022-04-15 – 2022-04-28 (×14): 20 mg via ORAL
  Filled 2022-04-15 (×15): qty 1

## 2022-04-15 NOTE — Progress Notes (Signed)
Pt is A&OX4, anxious ("10/10"), denies suicidal ideations, denies homicidal ideations, denies auditory hallucinations and denies visual hallucinations. Pt verbally agrees to approach staff if these become apparent and before harming self or others. Pt denies experiencing nightmares. Mood and affect are congruent. Pt appetite is ok. No complaints of pain and/or discomfort at this time. Pt's memory appears to be grossly intact, and Pt hasn't displayed any injurious behaviors. Pt is medication compliant. There's no evidence of suicidal intent. Psychomotor activity was WNL. No s/s of Parkinson, Dystonia, Akathisia and/or Tardive Dyskinesia noted.

## 2022-04-15 NOTE — Progress Notes (Signed)
Patient complains of anxiety that has been unresolved by medications. Patient believes she is leaving this shift, though she has no indication by physician that she is leaving. Patient reported not being able to sleep last night due to sleep interruption every hour.   Assess patient for safety, offer medications as prescribed, encourage patient to practice making use of coping skills.   Patient able to contract for safety. Continue to monitor as planned.

## 2022-04-15 NOTE — Progress Notes (Signed)
[facility] MD Progress Note  04/15/2022 1:59 PM Marie Diaz  MRN:  [id number]  HPI:  Marie Diaz is a 56 yr old female who presented to [facility] on 10/13 due to depression, SI, and command auditory hallucinations, she was admitted to [facility] on 10/14.  PPHx is significant for Anxiety and no history of Suicide Attempts, Self Injurious Behavior, or Psychiatric Hospitalizations.   24 hr chart review: BP has been trending high since admission, and was 161/99 earlier today morning. As per nursing documentation, sleep last night was poor and pt was offered additional Hydroxyzine, but declined. Pt has been compliant with her scheduled medications, and last required Xanax 0.5 mg earlier today morning. Pt has been noted to be anxious and demanding discharge as per nursing documentation from the past 24 hrs.  Today's patient assessment note: Pt with flat affect and an angry, irritable and depressed mood during this encounter. Her attention to personal hygiene and grooming is fair, eye contact is good, speech is clear & coherent. Thought contents are organized, but with some illogical contents, and pt continues to present with paranoia and states that nonspecific people are out to get her. Pt however denies SI/HI/AVH.   Pt rates her depressive symptoms today as 8 (10 being worst), rates anxiety as 8 (10 being worst). She denies being in any physical pain, reports occasional constipation and reflux, requesting pepcid and a stool softener. She denies any medication related side effects. No TD/EPS type symptoms found on assessment, and pt denies any feelings of stiffness. AIMS: 0. Medication adjustments for today are as follows: Increased Remeron to 15 mg for insomnia, appetite stimulation and depressive symptoms, increase Zyprexa to 15 mg nightly for paranoia and help with insomnia, add Famotidine 40 mg daily for GERD, add Colace 100 mg daily for constipation. We are continuing other medications as listed below. Will  continue to monitor.  Baseline UA ordered.  Principal Problem: Major depressive disorder, single episode with psychotic features ([place]) Diagnosis: Principal Problem:   Major depressive disorder, single episode with psychotic features ([place])  Total Time spent with patient:  I personally spent 30 minutes on the unit in direct patient care. The direct patient care time included face-to-face time with the patient, reviewing the patient's chart, communicating with other professionals, and coordinating care. Greater than 50% of this time was spent in counseling or coordinating care with the patient regarding goals of hospitalization, psycho-education, and discharge planning needs.   Past Psychiatric History: Anxiety and no history of Suicide Attempts, Self Injurious Behavior, or Psychiatric Hospitalizations.   Past Medical History:  Past Medical History:  Diagnosis Date   Anxiety    Depression    Gastric ulcer    Gastritis    Hypertension     Past Surgical History:  Procedure Laterality Date   ABDOMINAL HYSTERECTOMY     BIOPSY  08/29/2019   Procedure: BIOPSY;  Surgeon: Danie Binder, MD;  Location: [facility];  Service: Endoscopy;;   BIOPSY  02/26/2021   Procedure: BIOPSY;  Surgeon: Eloise Harman, DO;  Location: [facility];  Service: Endoscopy;;   CHOLECYSTECTOMY     ESOPHAGOGASTRODUODENOSCOPY N/A 08/29/2019   normal esophagus, localized moderate inflammation with adherent blood, congestion, erosions, and friability on greater curvature of stomach. Biopsy with chronic gastritis, negative H.pylori.    ESOPHAGOGASTRODUODENOSCOPY (EGD) WITH PROPOFOL N/A 02/26/2021   gastritis and pathology with reactive gastropathy and negative H.pylori   HEMORRHOID SURGERY     Family History:  Family  History  Problem Relation Age of Onset   Colon cancer Neg Hx    Colon polyps Neg Hx    Family Psychiatric  History: Reports None Social History:  Social History   Substance and Sexual  Activity  Alcohol Use No     Social History   Substance and Sexual Activity  Drug Use No    Social History   Socioeconomic History   Marital status: Married    Spouse name: Not on file   Number of children: Not on file   Years of education: Not on file   Highest education level: Not on file  Occupational History   Not on file  Tobacco Use   Smoking status: Every Day    Packs/day: 0.50    Types: Cigarettes   Smokeless tobacco: Never  Vaping Use   Vaping Use: Former  Substance and Sexual Activity   Alcohol use: No   Drug use: No   Sexual activity: Yes  Other Topics Concern   Not on file  Social History Narrative   [phone]), kids(2). 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 SHE'S AN ONLY CHILD.   Social Determinants of Health   Financial Resource Strain: Not on file  Food Insecurity: Not on file  Transportation Needs: Not on file  Physical Activity: Not on file  Stress: Not on file  Social Connections: Not on file   Additional Social History:     Sleep: Poor  Appetite:  Fair  Current Medications: Current Facility-Administered Medications  Medication Dose Route Frequency Provider Last Rate Last Admin   acetaminophen (TYLENOL) tablet 650 mg  650 mg Oral Q6H PRN Lauree Chandler, NP       ALPRAZolam Prudy Feeler) tablet 0.5 mg  0.5 mg Oral BID PRN Thalia Party, MD   0.5 mg at 04/15/22 0733   alum & mag hydroxide-simeth (MAALOX/MYLANTA) 200-200-20 MG/5ML suspension 30 mL  30 mL Oral Q4H PRN Lauree Chandler, NP       Melene Muller ON 04/16/2022] amLODipine (NORVASC) tablet 10 mg  10 mg Oral Daily Courtni Balash, Tyler Aas, NP       cloNIDine (CATAPRES) tablet 0.1 mg  0.1 mg Oral BID Thalia Party, MD   0.1 mg at 04/15/22 0731   docusate sodium (COLACE) capsule 100 mg  100 mg Oral Daily Martasia Talamante, NP       famotidine (PEPCID) tablet 20 mg  20 mg Oral Daily Rifky Lapre, NP       feeding supplement (ENSURE ENLIVE / ENSURE PLUS)  liquid 237 mL  237 mL Oral BID BM Lauree Chandler, NP   237 mL at 04/15/22 1326   folic acid (FOLVITE) tablet 1 mg  1 mg Oral Daily Paliy, Serina Cowper, MD   1 mg at 04/15/22 0732   hydrOXYzine (ATARAX) tablet 25 mg  25 mg Oral TID PRN Starleen Blue, NP       OLANZapine zydis (ZYPREXA) disintegrating tablet 5 mg  5 mg Oral Q8H PRN Lauree Chandler, NP   5 mg at 04/13/22 7017   And   LORazepam (ATIVAN) tablet 1 mg  1 mg Oral PRN Lauree Chandler, NP       And   ziprasidone (GEODON) injection 20 mg  20 mg Intramuscular PRN Lauree Chandler, NP       magnesium hydroxide (MILK OF MAGNESIA) suspension 30 mL  30 mL Oral Daily PRN Lauree Chandler, NP  mirtazapine (REMERON) tablet 15 mg  15 mg Oral QHS Yuette Putnam, NP       OLANZapine (ZYPREXA) tablet 15 mg  15 mg Oral QHS Trinita Devlin, NP        Lab Results:  Results for orders placed or performed during the hospital encounter of 04/11/22 (from the past 48 hour(s))  HIV Antibody (routine testing w rflx)     Status: None   Collection Time: 04/14/22  6:23 AM  Result Value Ref Range   HIV Screen 4th Generation wRfx Non Reactive Non Reactive    Comment: Performed at [facility], [address], [place], [place] [address]  Comprehensive metabolic panel     Status: Abnormal   Collection Time: 04/15/22  6:34 AM  Result Value Ref Range   Sodium 143 135 - 145 mmol/L   Potassium 4.1 3.5 - 5.1 mmol/L   Chloride 110 98 - 111 mmol/L   CO2 27 22 - 32 mmol/L   Glucose, Bld 123 (H) 70 - 99 mg/dL    Comment: Glucose reference range applies only to samples taken after fasting for at least 8 hours.   BUN 20 6 - 20 mg/dL   Creatinine, Ser 5.57 0.44 - 1.00 mg/dL   Calcium 8.6 (L) 8.9 - 10.3 mg/dL   Total Protein 6.4 (L) 6.5 - 8.1 g/dL   Albumin 3.7 3.5 - 5.0 g/dL   AST 16 15 - 41 U/L   ALT 16 0 - 44 U/L   Alkaline Phosphatase 51 38 - 126 U/L   Total Bilirubin 0.6 0.3 - 1.2 mg/dL   GFR, Estimated >32 >20 mL/min    Comment:  (NOTE) Calculated using the CKD-EPI Creatinine Equation (2021)    Anion gap 6 5 - 15    Comment: Performed at [facility], [address], [place], [place] [address]    Blood Alcohol level:  Lab Results  Component Value Date   ETH <10 04/11/2022   ETH <10 06/11/2019    Metabolic Disorder Labs: Lab Results  Component Value Date   HGBA1C 4.8 04/11/2022   MPG 91.06 04/11/2022   No results found for: "PROLACTIN" Lab Results  Component Value Date   CHOL 177 04/11/2022   TRIG 193 (H) 04/11/2022   HDL 41 04/11/2022   CHOLHDL 4.3 04/11/2022   VLDL 39 04/11/2022   LDLCALC 97 04/11/2022    Physical Findings: AIMS:  , ,  ,  ,    CIWA:    COWS:     Musculoskeletal: Strength & Muscle Tone: decreased Gait & Station: normal Patient leans: N/A  Psychiatric Specialty Exam:  Presentation  General Appearance:  Fairly Groomed  Eye Contact: Good  Speech: Clear and Coherent  Speech Volume: Normal  Handedness:Right   Mood and Affect  Mood: Depressed; Anxious  Affect: Congruent   Thought Process  Thought Processes: Coherent  Descriptions of Associations:Intact  Orientation:Full (Time, Place and Person)  Thought Content:Logical  History of Schizophrenia/Schizoaffective disorder:No  Duration of Psychotic Symptoms:Greater than six months  Hallucinations:Hallucinations: None  Ideas of Reference:Paranoia  Suicidal Thoughts:Suicidal Thoughts: No  Homicidal Thoughts:Homicidal Thoughts: No   Sensorium  Memory: Immediate Good  Judgment: Fair  Insight: Fair   Art therapist  Concentration: Fair  Attention Span: Fair  Recall: Fair  Fund of Knowledge: Fair  Language: Fair   Psychomotor Activity  Psychomotor Activity:Psychomotor Activity: Normal   Assets  Assets: Manufacturing systems engineer; Housing; Social Support   Sleep  Sleep:Sleep: Poor  Physical Exam: Physical Exam Vitals and nursing note  reviewed.  Constitutional:      General: She is not in acute distress.    Appearance: Normal appearance. She is normal weight. She is ill-appearing. She is not toxic-appearing.  HENT:     Head: Normocephalic and atraumatic.  Pulmonary:     Effort: Pulmonary effort is normal.  Musculoskeletal:        General: Normal range of motion.  Neurological:     Mental Status: She is alert.    Review of Systems  Respiratory:  Negative for cough and shortness of breath.   Cardiovascular:  Negative for chest pain.  Gastrointestinal:  Negative for abdominal pain, constipation, diarrhea, nausea and vomiting.  Neurological:  Positive for weakness (mild). Negative for dizziness and headaches (mild).  Psychiatric/Behavioral:  Positive for depression. Negative for hallucinations, memory loss, substance abuse and suicidal ideas. The patient has insomnia. The patient is not nervous/anxious.    Blood pressure 116/74, pulse 81, temperature 98 F (36.7 C), temperature source Oral, SpO2 100 %. There is no height or weight on file to calculate BMI.   Treatment Plan Summary: Daily contact with patient to assess and evaluate symptoms and progress in treatment and Medication management  Marie Diaz is a 56 yr old female who presented to [facility] on 10/13 due to depression, SI, and command auditory hallucinations, she was admitted to [facility] on 10/14.  PPHx is significant for Anxiety and no history of Suicide Attempts, Self Injurious Behavior, or Psychiatric Hospitalizations.    Marie Diaz is reporting no improvement and was still in bed when interviewed at 10 AM.  Since her Zyprexa was just increased we do not want to overly sedate her by increasing her Remeron.  If she is more active today/tomorrow could consider increasing her Remeron tomorrow evening.  If she continues to be sedated may consider changing Zyprexa for another antipsychotic, however, it has been effective at eliminating her AVH.  She did have hypokalemia  on admission so will order a one time dose of Kdur and will recheck a CMP tomorrow morning.  We will continue to monitor.    MDD, Recurrent, Severe, w/Psychosis: -Increase Remeron 15 mg QHS for depression -Increase Zyprexa to 15 mg QHS for psychosis -Continue Xanax 0.5 mg BID PRN for anxiety -Continue Agitation Protocol: Zyprexa/Ativan/Geodon   HTN: -Increase Amlodipine to 10 mg daily -Continue Clonidine 0.1 mg BID  GERD -Start Famotidine 20 mg daily  Constipation -Start Colace 100 mg daily  Folate Deficiency: -Continue Folic Acid 1 mg daily   Hypokalemia Resolved: -One time dose Kdur 40 mEq -K now 4.1 from 10/17   -Continue Ensure 237 mg BID -Continue PRN's: Tylenol, Maalox, Atarax, Milk of Magnesia   Labs: 10/13- CMP: WNL except for K: 3.0,  BUN < 5,  Total Protein: 6.4,  CBC: WNL except WBC: 11.8,  RBC: 5.27,  Hem: 16.7,  MCHC: 36.5,  Neut Abs: 8.4, Mono Abs: 1.1, Lipid Panel: WNL except Trig: 193,  A1c: 4.8, TSH: 1.902,  UDS: Benzo pos,  EtOH: Neg,  EKG: Sinus Tach with Qtc: 439 10/14- UA: WNL 10/15-  RPR: Neg,  Folate: 5.2,  Ceruloplasmin: 15.6,  Awaiting HIV   Starleen Blue, NP 04/15/2022, 1:59 PMPatient ID: Marie Diaz, female   DOB: [birth year], 56 y.o.   MRN: [id number]

## 2022-04-15 NOTE — Progress Notes (Signed)
Offered Pam Vistaril for sleep. Declines.

## 2022-04-15 NOTE — [facility] Suicide Risk Assessment (Signed)
[facility] INPATIENT:  Family/Significant Other Suicide Prevention Education  Suicide Prevention Education:  Education Completed; Cecile Hearing,  [phone] of family member/significant other) has been identified by the patient as the family member/significant other with whom the patient will be residing, and identified as the person(s) who will aid the patient in the event of a mental health crisis (suicidal ideations/suicide attempt).  With written consent from the patient, the family member/significant other has been provided the following suicide prevention education, prior to the and/or following the discharge of the patient.  CSW spoke with patient son who reports that patient has been depressed since his father died but that he has been speaking with her and he feels that she is ready to come home.  Reports that the whole family is ready to have her home.  Son has no safety concerns at this time and at discharge her mother will pick her up.  No guns/weapons.   The suicide prevention education provided includes the following: Suicide risk factors Suicide prevention and interventions National Suicide Hotline telephone number [facility] assessment telephone number [facility] [place] and/or Residential Mobile Crisis Unit telephone number  Request made of family/significant other to: Remove weapons (e.g., guns, rifles, knives), all items previously/currently identified as safety concern.   Remove drugs/medications (over-the-counter, prescriptions, illicit drugs), all items previously/currently identified as a safety concern.  The family member/significant other verbalizes understanding of the suicide prevention education information provided.  The family member/significant other agrees to remove the items of safety concern listed above.  Marie Diaz E Carl Bleecker 04/15/2022, 11:57 AM

## 2022-04-15 NOTE — Progress Notes (Signed)
Patient remains awake most of this night. Unable to sleep.She has declined her Vistaril and received Xanax early tonight.

## 2022-04-15 NOTE — Group Note (Signed)
Recreation Therapy Group Note   Group Topic:Other  Group Date: 04/15/2022 Start Time: 1005 End Time: 8832 Facilitators: Kwabena Strutz-McCall, LRT,CTRS Location: [address] Dayroom   Goal Area(s) Addresses:  Patient will express the benefits of music to them. Patient will identify the impact of music for them post d/c.  Group Description: Music Therapy.  Patients were given to opportunity to request songs they wanted to hear.  Patients picked songs that got them moving, relaxed them or was something special to them. Patients could sing along, dance to or just listen as the music played during group session.   Affect/Mood: N/A   Participation Level: Did not attend    Clinical Observations/Individualized Feedback:     Plan: Continue to engage patient in RT group sessions 2-3x/week.   Broly Hatfield-McCall, LRT,CTRS 04/15/2022 12:14 PM

## 2022-04-15 NOTE — Progress Notes (Signed)
Marie Diaz is having trouble sleeping tonight. She is not anxious as she was earlier tonight. Ask frequently,"What time is it?" Surprised each time thinking it is later in the morning. Focus on being discharged today. Spent most of day in bed yesterday but does not appear to have slept much.

## 2022-04-16 DIAGNOSIS — F323 Major depressive disorder, single episode, severe with psychotic features: Secondary | ICD-10-CM | POA: Diagnosis not present

## 2022-04-16 LAB — URINALYSIS, ROUTINE W REFLEX MICROSCOPIC
Bilirubin Urine: NEGATIVE
Glucose, UA: NEGATIVE mg/dL
Hgb urine dipstick: NEGATIVE
Ketones, ur: NEGATIVE mg/dL
Leukocytes,Ua: NEGATIVE
Nitrite: NEGATIVE
Protein, ur: NEGATIVE mg/dL
Specific Gravity, Urine: 1.004 — ABNORMAL LOW (ref 1.005–1.030)
pH: 7 (ref 5.0–8.0)

## 2022-04-16 NOTE — Progress Notes (Signed)
Pt redirected by staff for coming out her room. Pt stated it was time for her to leave. Pt stated she is having hallucinations. Pt stated she sees her nephew JJ standing by a tree near the apartment complex's in [place]. Writer assured pt that she is at [facility] and she is safe. Writer informed pt that staff is checking on her every 15 min.  Pt stated she needed to use the phone to call a friend because she has to get the truck back to [place]. Writer informed pt the phone come back on at 6:00am and she will be able to call her friend then. Pt adhered to staff redirection an\d went back to her room.

## 2022-04-16 NOTE — Progress Notes (Signed)
[facility] MD Progress Note  04/16/2022 2:40 PM Marie Diaz  MRN:  [id number]  HPI:  Marie Diaz is a 56 yr old female who [facility] due to depression, SI, and command auditory hallucinations, [facility].  PPHx is significant for Anxiety and no history of Suicide Attempts, Self Injurious Behavior, or Psychiatric Hospitalizations.   24 hr chart review: BP within the past 24 hrs have had periods of elevations, and DBP was 126 earlier today morning. Pt has continued to be compliant with her scheduled medications, and last required Xanax 0.5 mg last night, and Hydroxyzine earlier today for anxiety. Pt had an episode last night which seems like confusion; staff documented that she was looking for her phone and was asking to leave. She was redirectable.   Today's patient assessment note: Pt reports feeling much better today, reports that sleep quality last night was better than the night prior. She reports that her anxiety is less today as compared to yesterday, and that depressive symptoms are less today. She rates anxiety today as 5 (10 being worst), and rates anxiety as 5 (10 being worst). Yesterday, she rated both anxiety and depression as 8. She denies SI, denies HI, denies AVH, denies paranoia and there is no evidence of delusional thinking. Pt denies being any physical pain today, thoughts are logical and speech is clear and coherent. There is no evidence of delusional thinking.  Call placed to Dr Herbie BaltimoreHarding with medicine regarding fluctuations in blood pressures. Dr. Herbie BaltimoreHarding recommended starting Valsartan 40 mg daily in addition to Norvasc, and slowly tapering pt off the Clonidine. There are concerns that pt's vital signs as in the flow sheets might not be accurate, and right cuff might not be in use, as there are reports of pt being restless during vital signs checks, and not sitting still which might be leading to false readings. Orders have been placed for  vitals Q 4 Hs while awake, and also for staff to do manual Bps and redirect pt to sit still when vitals are being checked. We will revisit BP meds after checking vitals for 24 hrs.  We will continue to monitor symptoms, and discharge most likely on Friday or Saturday if symptoms continue to improve. We are continuing medications as listed below. Pt denies any current medication related side effects. No TD/EPS type symptoms found on assessment, and pt denies any feelings of stiffness. AIMS: 0.   Principal Problem: Major depressive disorder, single episode with psychotic features (HCC) Diagnosis: Principal Problem:   Major depressive disorder, single episode with psychotic features (HCC)  Total Time spent with patient:  I personally spent 30 minutes on the unit in direct patient care. The direct patient care time included face-to-face time with the patient, reviewing the patient's chart, communicating with other professionals, and coordinating care. Greater than 50% of this time was spent in counseling or coordinating care with the patient regarding goals of hospitalization, psycho-education, and discharge planning needs.   Past Psychiatric History: Anxiety and no history of Suicide Attempts, Self Injurious Behavior, or Psychiatric Hospitalizations.   Past Medical History:  Past Medical History:  Diagnosis Date   Anxiety    Depression    Gastric ulcer    Gastritis    Hypertension     Past Surgical History:  Procedure Laterality Date   ABDOMINAL HYSTERECTOMY     BIOPSY  08/29/2019   Procedure: BIOPSY;  Surgeon: West BaliFields, Sandi L, MD;  Location: [facility]  ENDO SUITE;  Service: Endoscopy;;   BIOPSY  02/26/2021   Procedure: BIOPSY;  Surgeon: Eloise Harman, DO;  Location: [facility] ENDO SUITE;  Service: Endoscopy;;   CHOLECYSTECTOMY     ESOPHAGOGASTRODUODENOSCOPY N/A 08/29/2019   normal esophagus, localized moderate inflammation with adherent blood, congestion, erosions, and friability on greater  curvature of stomach. Biopsy with chronic gastritis, negative H.pylori.    ESOPHAGOGASTRODUODENOSCOPY (EGD) WITH PROPOFOL N/A 02/26/2021   gastritis and pathology with reactive gastropathy and negative H.pylori   HEMORRHOID SURGERY     Family History:  Family History  Problem Relation Age of Onset   Colon cancer Neg Hx    Colon polyps Neg Hx    Family Psychiatric  History: Reports None Social History:  Social History   Substance and Sexual Activity  Alcohol Use No     Social History   Substance and Sexual Activity  Drug Use No    Social History   Socioeconomic History   Marital status: Married    Spouse name: Not on file   Number of children: Not on file   Years of education: Not on file   Highest education level: Not on file  Occupational History   Not on file  Tobacco Use   Smoking status: Every Day    Packs/day: 0.50    Types: Cigarettes   Smokeless tobacco: Never  Vaping Use   Vaping Use: Former  Substance and Sexual Activity   Alcohol use: No   Drug use: No   Sexual activity: Yes  Other Topics Concern   Not on file  Social History Narrative   [phone]), kids(2). 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 SHE'S AN ONLY CHILD.   Social Determinants of Health   Financial Resource Strain: Not on file  Food Insecurity: Not on file  Transportation Needs: Not on file  Physical Activity: Not on file  Stress: Not on file  Social Connections: Not on file   Additional Social History:     Sleep: Poor  Appetite:  Fair  Current Medications: Current Facility-Administered Medications  Medication Dose Route Frequency Provider Last Rate Last Admin   acetaminophen (TYLENOL) tablet 650 mg  650 mg Oral Q6H PRN Tharon Aquas, NP       ALPRAZolam Duanne Moron) tablet 0.5 mg  0.5 mg Oral BID PRN Larita Fife, MD   0.5 mg at 04/15/22 2143   alum & mag hydroxide-simeth (MAALOX/MYLANTA) 200-200-20 MG/5ML suspension 30 mL   30 mL Oral Q4H PRN Tharon Aquas, NP       amLODipine (NORVASC) tablet 10 mg  10 mg Oral Daily Danasia Baker, Tamela Oddi, NP   10 mg at 04/16/22 0801   cloNIDine (CATAPRES) tablet 0.1 mg  0.1 mg Oral BID Larita Fife, MD   0.1 mg at 04/16/22 0802   docusate sodium (COLACE) capsule 100 mg  100 mg Oral Daily Christyne Mccain, Tamela Oddi, NP   100 mg at 04/16/22 0801   famotidine (PEPCID) tablet 20 mg  20 mg Oral Daily Emilyrose Darrah, Tamela Oddi, NP   20 mg at 04/16/22 0801   feeding supplement (ENSURE ENLIVE / ENSURE PLUS) liquid 237 mL  237 mL Oral BID BM Tharon Aquas, NP   237 mL at 27/25/36 6440   folic acid (FOLVITE) tablet 1 mg  1 mg Oral Daily Larita Fife, MD   1 mg at 04/16/22 0802   hydrOXYzine (ATARAX) tablet 25 mg  25 mg Oral TID PRN Laxmi Choung,  Starlene Consuegra, NP   25 mg at 04/16/22 0804   OLANZapine zydis (ZYPREXA) disintegrating tablet 5 mg  5 mg Oral Q8H PRN Lauree Chandler, NP   5 mg at 04/13/22 6295   And   LORazepam (ATIVAN) tablet 1 mg  1 mg Oral PRN Lauree Chandler, NP       And   ziprasidone (GEODON) injection 20 mg  20 mg Intramuscular PRN Lauree Chandler, NP       magnesium hydroxide (MILK OF MAGNESIA) suspension 30 mL  30 mL Oral Daily PRN Lauree Chandler, NP       mirtazapine (REMERON) tablet 15 mg  15 mg Oral QHS Starleen Blue, NP   15 mg at 04/15/22 2042   OLANZapine (ZYPREXA) tablet 15 mg  15 mg Oral QHS Starleen Blue, NP   15 mg at 04/15/22 2133    Lab Results:  Results for orders placed or performed during the hospital encounter of 04/11/22 (from the past 48 hour(s))  Comprehensive metabolic panel     Status: Abnormal   Collection Time: 04/15/22  6:34 AM  Result Value Ref Range   Sodium 143 135 - 145 mmol/L   Potassium 4.1 3.5 - 5.1 mmol/L   Chloride 110 98 - 111 mmol/L   CO2 27 22 - 32 mmol/L   Glucose, Bld 123 (H) 70 - 99 mg/dL    Comment: Glucose reference range applies only to samples taken after fasting for at least 8 hours.   BUN 20 6 - 20 mg/dL   Creatinine, Ser 2.84  0.44 - 1.00 mg/dL   Calcium 8.6 (L) 8.9 - 10.3 mg/dL   Total Protein 6.4 (L) 6.5 - 8.1 g/dL   Albumin 3.7 3.5 - 5.0 g/dL   AST 16 15 - 41 U/L   ALT 16 0 - 44 U/L   Alkaline Phosphatase 51 38 - 126 U/L   Total Bilirubin 0.6 0.3 - 1.2 mg/dL   GFR, Estimated >13 >24 mL/min    Comment: (NOTE) Calculated using the CKD-EPI Creatinine Equation (2021)    Anion gap 6 5 - 15    Comment: Performed at [facility], [address], [place], [place] [address]  Urinalysis, Routine w reflex microscopic Urine, Unspecified Source     Status: Abnormal   Collection Time: 04/15/22  7:16 PM  Result Value Ref Range   Color, Urine STRAW (A) YELLOW   APPearance CLEAR CLEAR   Specific Gravity, Urine 1.004 (L) 1.005 - 1.030   pH 7.0 5.0 - 8.0   Glucose, UA NEGATIVE NEGATIVE mg/dL   Hgb urine dipstick NEGATIVE NEGATIVE   Bilirubin Urine NEGATIVE NEGATIVE   Ketones, ur NEGATIVE NEGATIVE mg/dL   Protein, ur NEGATIVE NEGATIVE mg/dL   Nitrite NEGATIVE NEGATIVE   Leukocytes,Ua NEGATIVE NEGATIVE    Comment: Performed at [facility], [address], [place], [place] [address]    Blood Alcohol level:  Lab Results  Component Value Date   [facility] <10 04/11/2022   ETH <10 06/11/2019    Metabolic Disorder Labs: Lab Results  Component Value Date   HGBA1C 4.8 04/11/2022   MPG 91.06 04/11/2022   No results found for: "PROLACTIN" Lab Results  Component Value Date   CHOL 177 04/11/2022   TRIG 193 (H) 04/11/2022   HDL 41 04/11/2022   CHOLHDL 4.3 04/11/2022   VLDL 39 04/11/2022   LDLCALC 97 04/11/2022    Physical Findings: AIMS:  , ,  ,  ,  CIWA:    COWS:     Musculoskeletal: Strength & Muscle Tone: decreased Gait & Station: normal Patient leans: N/A  Psychiatric Specialty Exam:  Presentation  General Appearance:  Appropriate for Environment; Fairly Groomed  Eye Contact: Fair  Speech: Clear and Coherent  Speech  Volume: Normal  Handedness:Right   Mood and Affect  Mood: Depressed  Affect: Congruent   Thought Process  Thought Processes: Coherent  Descriptions of Associations:Intact  Orientation:Full (Time, Place and Person)  Thought Content:Logical  History of Schizophrenia/Schizoaffective disorder:No  Duration of Psychotic Symptoms:N/A  Hallucinations:Hallucinations: None  Ideas of Reference:None  Suicidal Thoughts:Suicidal Thoughts: No  Homicidal Thoughts:Homicidal Thoughts: No   Sensorium  Memory: Immediate Good  Judgment: Fair  Insight: Fair   Chartered certified accountant: Fair  Attention Span: Fair  Recall: Fair  Fund of Knowledge: Fair  Language: Fair   Psychomotor Activity  Psychomotor Activity:Psychomotor Activity: Normal   Assets  Assets: Communication Skills   Sleep  Sleep:Sleep: Fair   Physical Exam: Physical Exam Vitals and nursing note reviewed.  Constitutional:      General: She is not in acute distress.    Appearance: Normal appearance. She is normal weight. She is ill-appearing. She is not toxic-appearing.  HENT:     Head: Normocephalic and atraumatic.  Pulmonary:     Effort: Pulmonary effort is normal.  Musculoskeletal:        General: Normal range of motion.  Neurological:     Mental Status: She is alert.    Review of Systems  Constitutional: Negative.   HENT: Negative.    Eyes: Negative.   Respiratory:  Negative for cough and shortness of breath.   Cardiovascular:  Negative for chest pain.  Gastrointestinal:  Negative for abdominal pain, constipation, diarrhea, nausea and vomiting.  Skin: Negative.   Neurological:  Positive for weakness (mild). Negative for dizziness and headaches (mild).  Psychiatric/Behavioral:  Positive for depression. Negative for hallucinations, memory loss, substance abuse and suicidal ideas. The patient has insomnia. The patient is not nervous/anxious.    Blood pressure (!)  146/84, pulse 81, temperature 98 F (36.7 C), temperature source Oral, SpO2 100 %. There is no height or weight on file to calculate BMI.   Treatment Plan Summary: Daily contact with patient to assess and evaluate symptoms and progress in treatment and Medication management  Marie Diaz is a 56 yr old female who presented to [facility] on 10/13 due to depression, SI, and command auditory hallucinations, she was admitted to [facility] on 10/14.  PPHx is significant for Anxiety and no history of Suicide Attempts, Self Injurious Behavior, or Psychiatric Hospitalizations.    Marie Diaz is reporting no improvement and was still in bed when interviewed at 10 AM.  Since her Zyprexa was just increased we do not want to overly sedate her by increasing her Remeron.  If she is more active today/tomorrow could consider increasing her Remeron tomorrow evening.  If she continues to be sedated may consider changing Zyprexa for another antipsychotic, however, it has been effective at eliminating her AVH.  She did have hypokalemia on admission so will order a one time dose of Kdur and will recheck a CMP tomorrow morning.  We will continue to monitor.    MDD, Recurrent, Severe, w/Psychosis: -Continue Remeron 15 mg QHS for depression -Continue  Zyprexa to 15 mg QHS for psychosis -Continue Xanax 0.5 mg BID PRN for anxiety -Continue Agitation Protocol: Zyprexa/Ativan/Geodon  HTN: -Increase Amlodipine to 10 mg daily -Continue Clonidine 0.1 mg  BID  GERD -Continue Famotidine 20 mg daily  Constipation -Continue Colace 100 mg daily  Folate Deficiency: -Continue Folic Acid 1 mg daily  Hypokalemia Resolved: -One time dose Kdur 40 mEq -K now 4.1 from 10/17  -Continue Ensure 237 mg BID -Continue PRN's: Tylenol, Maalox, Atarax, Milk of Magnesia  Labs: 10/13- CMP: WNL except for K: 3.0,  BUN < 5,  Total Protein: 6.4,  CBC: WNL except WBC: 11.8,  RBC: 5.27,  Hem: 16.7,  MCHC: 36.5,  Neut Abs: 8.4, Mono Abs: 1.1, Lipid  Panel: WNL except Trig: 193,  A1c: 4.8, TSH: 1.902,  UDS: Benzo pos,  EtOH: Neg,  EKG: Sinus Tach with Qtc: 439 10/14- UA: WNL 10/15-  RPR: Neg,  Folate: 5.2,  Ceruloplasmin: 15.6,  Awaiting HIV   Starleen Blue, NP 04/16/2022, 2:40 PMPatient ID: Marie Diaz, female   DOB: [birth year], 56 y.o.   MRN: [id number] Patient ID: Marie Diaz, female   DOB: Jan 21, 1966, 56 y.o.   MRN: [id number]

## 2022-04-16 NOTE — Group Note (Signed)
Recreation Therapy Group Note   Group Topic:Relaxation  Group Date: 04/16/2022 Start Time: 1000 End Time: 9147 Facilitators: Pardeep Pautz-McCall, LRT,CTRS Location: [address] Dayroom   Goal Area(s) Addresses:  Patient will successfully identify songs that help them relax. Patient will identify healthy ways to increase relaxation. Patient will acknowledge benefit(s) of relaxation.  Group Description:  Relaxation Music.  Patients were allowed to pick songs that helped them relax and have meaning to them.  LRT played the songs requested by patients.  Patients sang a long with and moved around to the beat of the songs being played during group session.   Affect/Mood: Appropriate   Participation Level: Did not attend    Clinical Observations/Individualized Feedback:     Plan: Continue to engage patient in RT group sessions 2-3x/week.   Kerney Hopfensperger-McCall, LRT,CTRS 04/16/2022 1:13 PM

## 2022-04-16 NOTE — Group Note (Signed)
Type of Therapy and Topic:  Group Therapy:  Stress Management   Participation Level:  Did Not Attend    Description of Group:  Patients in this group were introduced to the idea of stress and encouraged to discuss negative and positive ways to manage stress. Patients discussed specific stressors that they have in their life right now and the physical signs and symptoms associated with that stress.  Patient encouraged to come up with positive changes to assist with the stress upon discharge in order to prevent future hospitalizations.   They also worked as a group on developing a specific plan for several patients to deal with stressors through boundary-setting, psychoeducation and self care techniques   Therapeutic Goals:               1)  To discuss the positive and negative impacts of stress             2)  identify signs and symptoms of stress             3)  generate ideas for stress management             4)  offer mutual support to others regarding stress management             5)  Developing plans for ways to manage specific stressors upon discharge               Summary of Patient Progress:  CSW and multiple staff tried to engage patient in participating in group with no luck.  Patient continues to decline participation.    Therapeutic Modalities:   Motivational Interviewing Brief Solution-Focused Therapy   Dashiel Bergquist, LCSW, LCAS Clincal Social Worker  [facility]    

## 2022-04-16 NOTE — Progress Notes (Signed)
   04/16/22 2040  Vital Signs  Pulse Rate 89  Pulse Rate Source Monitor  BP (!) 162/87  BP Location Left Arm  BP Method Automatic  Patient Position (if appropriate) Sitting  Oxygen Therapy  SpO2 100 %  Note  Observations Pt anxious and paraniod. Pt reporting that she hears the staff calling her family saying she is overdosing on pills. Pt reoriented and made aware that staff has not called her familyand reported anything on her.   Pt given PRN xanax to help with her anxiety.

## 2022-04-16 NOTE — [facility] Group Notes (Signed)
Adult Psychoeducational Group Note  Date:  04/16/2022 Time:  2:35 PM  Group Topic/Focus:  Wellness Toolbox:   The focus of this group is to discuss various aspects of wellness, balancing those aspects and exploring ways to increase the ability to experience wellness.  Patients will create a wellness toolbox for use upon discharge.  Participation Level:  Minimal  Participation Quality:  Appropriate  Affect:  Appropriate  Cognitive:  Appropriate  Insight: Appropriate  Engagement in Group:  Engaged  Modes of Intervention:  Activity  Additional Comments:  Patient attended and participated in the therapeutic group activity.  Marie Diaz 04/16/2022, 2:35 PM

## 2022-04-16 NOTE — Progress Notes (Signed)
Pt redirected by staff for coming out her room. Pt stated it was time for her to leave. Pt pack up her items in her room and headed towards the door. Writer informed pt of the discharge process. Pt adhered to staff redirection and went back to her room.

## 2022-04-16 NOTE — Progress Notes (Addendum)
Pt redirected by staff for coming out her room. Pt stated she was looking for her phone. Writer informed pt that her phone is in her locker and she would received it back at discharge.  Writer informed pt she will be able to use the unit phone at 6:00am. Pt stated just shoot me in the head with your gun. Writer informed pt that we are here to help pt and would like for her to stay in her room and try to get some rest. Pt adhered to staff redirection and went back to her room.

## 2022-04-16 NOTE — Progress Notes (Signed)
Pt redirected by staff for coming out her room. Pt stated it was time for her to leave. Pt stated she needed to use the phone to call a friend because she has to get the truck back to [place]. Writer informed pt the phone come back on at 6:00am and she will be able to call her friend then. Pt adhered to staff redirection and went back to her room.

## 2022-04-16 NOTE — Progress Notes (Signed)
Pt presents very confused this evening, demanding discharge to her car this evening "Take me outside to my car now, my daughter was outside waiting for me now". She also called family twice to pick her up, multiple redirections done on appropriate phone use but to no avail. Presents anxious, pacing, restless and fidgety this shift. Received PRN Vistaril 25 mg PO at 0804 with minimal effect when reassessed at 0900. Reports she slept well with good appetite. Safety checks maintained at Q 15 minutes intervals without incident. Verbal education done on current treatment regimen with minimal understanding. Support, encouragement and reassurance provided to pt this shift. Pt remains safe in milieu. Pt continues to need frequent verbal redirections.

## 2022-04-17 DIAGNOSIS — F323 Major depressive disorder, single episode, severe with psychotic features: Secondary | ICD-10-CM | POA: Diagnosis not present

## 2022-04-17 MED ORDER — BENZTROPINE MESYLATE 0.5 MG PO TABS
0.5000 mg | ORAL_TABLET | Freq: Two times a day (BID) | ORAL | Status: DC | PRN
  Administered 2022-04-18 – 2022-04-25 (×4): 0.5 mg via ORAL
  Filled 2022-04-17 (×4): qty 1

## 2022-04-17 MED ORDER — LORAZEPAM 0.5 MG PO TABS: 0.2500 mg | ORAL_TABLET | Freq: Two times a day (BID) | ORAL | Status: DC

## 2022-04-17 MED ORDER — LORAZEPAM 0.5 MG PO TABS: 0.5000 mg | ORAL_TABLET | Freq: Two times a day (BID) | ORAL | Status: DC

## 2022-04-17 MED ORDER — LORAZEPAM 0.5 MG PO TABS
0.5000 mg | ORAL_TABLET | Freq: Two times a day (BID) | ORAL | Status: AC
  Administered 2022-04-18: 0.5 mg via ORAL
  Filled 2022-04-17: qty 1

## 2022-04-17 MED ORDER — HALOPERIDOL 5 MG PO TABS
5.0000 mg | ORAL_TABLET | Freq: Two times a day (BID) | ORAL | Status: DC
  Administered 2022-04-17 – 2022-04-26 (×19): 5 mg via ORAL
  Filled 2022-04-17 (×22): qty 1

## 2022-04-17 MED ORDER — LORAZEPAM 1 MG PO TABS
1.0000 mg | ORAL_TABLET | Freq: Two times a day (BID) | ORAL | Status: AC
  Administered 2022-04-17: 1 mg via ORAL
  Filled 2022-04-17: qty 1

## 2022-04-17 MED ORDER — LORAZEPAM 1 MG PO TABS: 1.0000 mg | ORAL_TABLET | Freq: Two times a day (BID) | ORAL | Status: DC

## 2022-04-17 MED ORDER — LORAZEPAM 0.5 MG PO TABS
0.2500 mg | ORAL_TABLET | Freq: Two times a day (BID) | ORAL | Status: AC
  Administered 2022-04-19 (×2): 0.25 mg via ORAL
  Filled 2022-04-17 (×2): qty 1

## 2022-04-17 MED ORDER — TRAZODONE HCL 50 MG PO TABS
50.0000 mg | ORAL_TABLET | Freq: Every day | ORAL | Status: DC
  Administered 2022-04-17 – 2022-04-18 (×2): 50 mg via ORAL
  Filled 2022-04-17 (×5): qty 1

## 2022-04-17 MED ORDER — MIRTAZAPINE 30 MG PO TABS
30.0000 mg | ORAL_TABLET | Freq: Every day | ORAL | Status: DC
  Administered 2022-04-17 – 2022-04-19 (×3): 30 mg via ORAL
  Filled 2022-04-17 (×5): qty 1

## 2022-04-17 NOTE — Plan of Care (Signed)
  Problem: Activity: Goal: Will verbalize the importance of balancing activity with adequate rest periods Outcome: Progressing   Problem: Education: Goal: Will be free of psychotic symptoms Outcome: Progressing Goal: Knowledge of the prescribed therapeutic regimen will improve Outcome: Progressing   Problem: Coping: Goal: Coping ability will improve Outcome: Progressing Goal: Will verbalize feelings Outcome: Progressing   Problem: Health Behavior/Discharge Planning: Goal: Compliance with prescribed medication regimen will improve Outcome: Progressing   Problem: Nutritional: Goal: Ability to achieve adequate nutritional intake will improve Outcome: Progressing   Problem: Role Relationship: Goal: Ability to communicate needs accurately will improve Outcome: Progressing Goal: Ability to interact with others will improve Outcome: Progressing   Problem: Safety: Goal: Ability to redirect hostility and anger into socially appropriate behaviors will improve Outcome: Progressing Goal: Ability to remain free from injury will improve Outcome: Progressing   Problem: Self-Care: Goal: Ability to participate in self-care as condition permits will improve Outcome: Progressing   Problem: Self-Concept: Goal: Will verbalize positive feelings about self Outcome: Progressing

## 2022-04-17 NOTE — Group Note (Signed)
Recreation Therapy Group Note   Group Topic:Problem Solving  Group Date: 04/17/2022 Start Time: 3500 End Time: 1030 Facilitators: Daneya Hartgrove-McCall, LRT,CTRS Location: [address] Dayroom   Goal Area(s) Addresses:  Patient will effectively work with peer towards shared goal.  Patient will identify factors that guided their decision making.  Patient will pro-socially communicate ideas during group session.    Group Description: Patients were given a scenario that they were going to be stranded on a deserted [place] for several months before being rescued. Writer tasked them with making a list of 15 things they would choose to bring with them for "survival". The list of items was prioritized most important to least. Each patient would come up with their own list, then work together to create a new list of 15 items while in a group of 3-5 peers. LRT discussed each person's list and how it differed from others. The debrief included discussion of priorities, good decisions versus bad decisions, and how it is important to think before acting so we can make the best decision possible. LRT tied the concept of effective communication among group members to patient's support systems outside of the hospital and its benefit post discharge.   Affect/Mood: N/A   Participation Level: Did not attend    Clinical Observations/Individualized Feedback:     Plan: Continue to engage patient in RT group sessions 2-3x/week.   Marie Diaz, LRT,CTRS 04/17/2022 12:20 PM

## 2022-04-17 NOTE — Progress Notes (Addendum)
   04/17/22 2107  Psych Admission Type (Psych Patients Only)  Admission Status Involuntary  Psychosocial Assessment  Patient Complaints Anxiety;Depression  Eye Contact Fair  Facial Expression Anxious;Blank  Affect Anxious  Speech Slow;Logical/coherent  Interaction Assertive  Motor Activity Tremors;Fidgety  Appearance/Hygiene Unremarkable  Behavior Characteristics Anxious;Fidgety  Mood Anxious;Depressed  Thought Process  Coherency WDL  Content WDL  Delusions None reported or observed  Perception WDL  Hallucination None reported or observed  Judgment Poor  Confusion None  Danger to Self  Current suicidal ideation? Denies  Self-Injurious Behavior No self-injurious ideation or behavior indicators observed or expressed   Agreement Not to Harm Self Yes  Description of Agreement Verbal  Danger to Others  Danger to Others None reported or observed   Patient is alert and oriented and presents with an anxious, depressed affect and mood. Patient denies SI, HI, AVH, and pain. Patient reports anxiety and depression 8/10. Scheduled medications administered to patient, per provider orders. Support and encouragement provided. Routine safety checks conducted every 15 minutes. Patient contracts for safety and  remains safe on the unit.

## 2022-04-17 NOTE — Progress Notes (Signed)
[facility] MD Progress Note  04/17/2022 2:18 PM Marie Diaz  MRN:  [id number]  HPI:  Marie Diaz is a 56 yr old female with past psychiatric history of anxiety, depression, [facility] admission (2011) who presented to [facility] voluntarily with her daughter 04/11/22 for increased depression and suicidal ideations with recent onset of auditory hallucinations in the context of the recent death of her husband. 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   24 hr chart review: BP within the past 24 hrs have had periods of elevations, and DBP was 89 earlier morning. Pt had an episode last night with confusion and increased agitation; staff documented that she was asking to leave, calling family members to transport her home. Unable to redirect. She was noted to have increased agitation and confusion as the night progressed requiring increased redirection and eventually several PRN medications including IM. Patient did eventually calm down after IM medications and slept the remainder of the night. No issues noted during morning shift. She remains medication compliant with no prn medication given since Geodon 20 mg IM ([id number]).   Assessment: Patient presents withdrawn in room, laying in bed. States she "doesn't remember much" from night before; denies any recollection of wanting to leave, only "getting a shot in my arm". Reports feeling "much better today"; appears drowsy. Speech low. Fleeting eye contact. Depressed, withdrawn affect. No delusional thought content noted. Endorses ongoing auditory hallucinations "telling me to kill myself, it happened". She denies any active suicidal ideations, visual hallucinations; appears calm and cooperative at the time of assessment with no signs of responding to any external/internal stimuli at this time. Medication changes noted below; she continues to deny any  medication related side effects. No TD/EPS type symptoms found on assessment, and pt denies any feelings of stiffness. AIMS: 0.   Principal Problem: Major depressive disorder, single episode with psychotic features ([place]) Diagnosis: Principal Problem:   Major depressive disorder, single episode with psychotic features ([place])  Past Psychiatric History: Anxiety and no history of Suicide Attempts, Self Injurious Behavior, or Psychiatric Hospitalizations.   Past Medical History:  Past Medical History:  Diagnosis Date   Anxiety    Depression    Gastric ulcer    Gastritis    Hypertension     Past Surgical History:  Procedure Laterality Date   ABDOMINAL HYSTERECTOMY     BIOPSY  08/29/2019   Procedure: BIOPSY;  Surgeon: Danie Binder, MD;  Location: [facility];  Service: Endoscopy;;   BIOPSY  02/26/2021   Procedure: BIOPSY;  Surgeon: Eloise Harman, DO;  Location: [facility];  Service: Endoscopy;;   CHOLECYSTECTOMY     ESOPHAGOGASTRODUODENOSCOPY N/A 08/29/2019   normal esophagus, localized moderate inflammation with adherent blood, congestion, erosions, and friability on greater curvature of stomach. Biopsy with chronic gastritis, negative H.pylori.    ESOPHAGOGASTRODUODENOSCOPY (EGD) WITH PROPOFOL N/A 02/26/2021   gastritis and pathology with reactive gastropathy and negative H.pylori   HEMORRHOID SURGERY     Family History:  Family History  Problem Relation Age of Onset   Colon cancer Neg Hx    Colon polyps Neg Hx    Family Psychiatric  History: Reports None Social History:  Social History   Substance and Sexual Activity  Alcohol Use No     Social History   Substance and Sexual Activity  Drug Use No    Social  History   Socioeconomic History   Marital status: Married    Spouse name: Not on file   Number of children: Not on file   Years of education: Not on file   Highest education level: Not on file  Occupational History   Not on file  Tobacco Use    Smoking status: Every Day    Packs/day: 0.50    Types: Cigarettes   Smokeless tobacco: Never  Vaping Use   Vaping Use: Former  Substance and Sexual Activity   Alcohol use: No   Drug use: No   Sexual activity: Yes  Other Topics Concern   Not on file  Social History Narrative   [phone]), kids(2). 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 SHE'S AN ONLY CHILD.   Social Determinants of Health   Financial Resource Strain: Not on file  Food Insecurity: Not on file  Transportation Needs: Not on file  Physical Activity: Not on file  Stress: Not on file  Social Connections: Not on file   Additional Social History:     Sleep: Poor  Appetite:  Fair  Current Medications: Current Facility-Administered Medications  Medication Dose Route Frequency Provider Last Rate Last Admin   acetaminophen (TYLENOL) tablet 650 mg  650 mg Oral Q6H PRN Tharon Aquas, NP       ALPRAZolam Duanne Moron) tablet 0.5 mg  0.5 mg Oral BID PRN Larita Fife, MD   0.5 mg at 04/16/22 2019   alum & mag hydroxide-simeth (MAALOX/MYLANTA) 200-200-20 MG/5ML suspension 30 mL  30 mL Oral Q4H PRN Tharon Aquas, NP       amLODipine (NORVASC) tablet 10 mg  10 mg Oral Daily Nicholes Rough, NP   10 mg at 04/17/22 2409   benztropine (COGENTIN) tablet 0.5 mg  0.5 mg Oral BID PRN Leevy-Johnson, Cedar Ditullio A, NP       cloNIDine (CATAPRES) tablet 0.1 mg  0.1 mg Oral BID Larita Fife, MD   0.1 mg at 04/17/22 0814   docusate sodium (COLACE) capsule 100 mg  100 mg Oral Daily Nicholes Rough, NP   100 mg at 04/17/22 0814   famotidine (PEPCID) tablet 20 mg  20 mg Oral Daily Nicholes Rough, NP   20 mg at 04/17/22 0814   feeding supplement (ENSURE ENLIVE / ENSURE PLUS) liquid 237 mL  237 mL Oral BID BM Tharon Aquas, NP   237 mL at 73/53/29 9242   folic acid (FOLVITE) tablet 1 mg  1 mg Oral Daily Paliy, Delrae Rend, MD   1 mg at 04/17/22 6834   haloperidol (HALDOL) tablet 5 mg  5 mg Oral BID  Leevy-Johnson, Joette Schmoker A, NP   5 mg at 04/17/22 1055   hydrOXYzine (ATARAX) tablet 25 mg  25 mg Oral TID PRN Nicholes Rough, NP   25 mg at 04/16/22 2351   LORazepam (ATIVAN) tablet 0.25 mg  0.25 mg Oral BID Leevy-Johnson, Payden Bonus A, NP       LORazepam (ATIVAN) tablet 0.5 mg  0.5 mg Oral BID Leevy-Johnson, Khristian Seals A, NP       LORazepam (ATIVAN) tablet 1 mg  1 mg Oral BID Leevy-Johnson, Zyaire Dumas A, NP       magnesium hydroxide (MILK OF MAGNESIA) suspension 30 mL  30 mL Oral Daily PRN Tharon Aquas, NP       mirtazapine (REMERON) tablet 30 mg  30 mg Oral QHS Leevy-Johnson, Blaine Hamper, NP  OLANZapine zydis (ZYPREXA) disintegrating tablet 5 mg  5 mg Oral Q8H PRN Lauree Chandler, NP   5 mg at 04/16/22 2351   traZODone (DESYREL) tablet 50 mg  50 mg Oral QHS Leevy-Johnson, Trimaine Maser A, NP        Lab Results:  Results for orders placed or performed during the hospital encounter of 04/11/22 (from the past 48 hour(s))  Urinalysis, Routine w reflex microscopic Urine, Unspecified Source     Status: Abnormal   Collection Time: 04/15/22  7:16 PM  Result Value Ref Range   Color, Urine STRAW (A) YELLOW   APPearance CLEAR CLEAR   Specific Gravity, Urine 1.004 (L) 1.005 - 1.030   pH 7.0 5.0 - 8.0   Glucose, UA NEGATIVE NEGATIVE mg/dL   Hgb urine dipstick NEGATIVE NEGATIVE   Bilirubin Urine NEGATIVE NEGATIVE   Ketones, ur NEGATIVE NEGATIVE mg/dL   Protein, ur NEGATIVE NEGATIVE mg/dL   Nitrite NEGATIVE NEGATIVE   Leukocytes,Ua NEGATIVE NEGATIVE    Comment: Performed at [facility], [address] [place], [place] [address]    Blood Alcohol level:  Lab Results  Component Value Date   ETH <10 04/11/2022   ETH <10 06/11/2019    Metabolic Disorder Labs: Lab Results  Component Value Date   HGBA1C 4.8 04/11/2022   MPG 91.06 04/11/2022   No results found for: "PROLACTIN" Lab Results  Component Value Date   CHOL 177 04/11/2022   TRIG 193 (H) 04/11/2022   HDL 41 04/11/2022    CHOLHDL 4.3 04/11/2022   VLDL 39 04/11/2022   LDLCALC 97 04/11/2022    Physical Findings: AIMS:  , ,  ,  ,    CIWA:    COWS:     Musculoskeletal: Strength & Muscle Tone: decreased Gait & Station: normal Patient leans: N/A  Psychiatric Specialty Exam:  Presentation  General Appearance:  Disheveled  Eye Contact: Fair  Speech: Slow  Speech Volume: Normal  Handedness:Right   Mood and Affect  Mood: Depressed  Affect: Congruent   Thought Process  Thought Processes: Coherent  Descriptions of Associations:Intact  Orientation:Partial  Thought Content:Illogical  History of Schizophrenia/Schizoaffective disorder:No  Duration of Psychotic Symptoms:N/A  Hallucinations:Hallucinations: Auditory Description of Auditory Hallucinations: voices telling me to kill myself  Ideas of Reference:None  Suicidal Thoughts:Suicidal Thoughts: No  Homicidal Thoughts:Homicidal Thoughts: No   Sensorium  Memory: Immediate Poor; Recent Fair  Judgment: Impaired  Insight: Shallow   Executive Functions  Concentration: Fair  Attention Span: Fair  Recall: Fair  Fund of Knowledge: Fair  Language: Fair   Psychomotor Activity  Psychomotor Activity:Psychomotor Activity: Normal   Assets  Assets: Resilience; Physical Health   Sleep  Sleep:Sleep: Fair   Physical Exam: Physical Exam Vitals and nursing note reviewed.  Constitutional:      General: She is not in acute distress.    Appearance: She is normal weight. She is ill-appearing. She is not toxic-appearing.     Comments: Older than stated age  HENT:     Head: Normocephalic and atraumatic.     Nose: Nose normal.     Mouth/Throat:     Mouth: Mucous membranes are dry.  Pulmonary:     Effort: Pulmonary effort is normal.  Musculoskeletal:        General: Normal range of motion.  Neurological:     Mental Status: She is alert. She is disoriented.  Psychiatric:        Attention and  Perception: She perceives auditory hallucinations.  Mood and Affect: Mood is depressed. Affect is flat.        Speech: Speech is delayed.        Behavior: Behavior is slowed and withdrawn.        Thought Content: Thought content does not include homicidal or suicidal ideation. Thought content does not include homicidal or suicidal plan.        Cognition and Memory: Memory is impaired.    Review of Systems  Constitutional: Negative.   HENT: Negative.    Eyes: Negative.   Respiratory:  Negative for cough and shortness of breath.   Cardiovascular:  Negative for chest pain.  Gastrointestinal:  Negative for abdominal pain, constipation, diarrhea, nausea and vomiting.  Skin: Negative.   Neurological:  Negative for dizziness and headaches (mild). Weakness: mild. Psychiatric/Behavioral:  Positive for depression and hallucinations. Negative for memory loss, substance abuse and suicidal ideas. The patient is not nervous/anxious.   All other systems reviewed and are negative.  Blood pressure (!) 145/89, pulse 95, temperature 97.8 F (36.6 C), temperature source Oral, resp. rate 16, SpO2 99 %. There is no height or weight on file to calculate BMI.   Treatment Plan Summary: Daily contact with patient to assess and evaluate symptoms and progress in treatment and Medication management  Marie Diaz is a 56 yr old female who presented to [facility] on 10/13 due to increased depression and suicidal ideations with new onset of command auditory hallucinations, she was admitted to [facility] on 10/14.  Past psychiatric history significant for anxiety and depression, x1 [facility] admission with no noted episodes of psychosis. Patient had episode on last night ( 10/18-19)where she became confused with some possible psychosis that did not respond to several PO medications in spite of recent medication adjustments: Zyprexa increased 15 mg daily HS (10/17), Remeron 15 mg HS started (10/17). Per nursing documentation 10/18  patient required redirection on 4 separate occasions for coming out of room stating it was time for her to leave and had her items packed while heading towards the door; patient did respond to redirection. 04/15/22 0212 patient noted to be anxious and have issues sleeping focused on discharge; no psychosis noted. Chart extensively reviewed with no history of psychosis noted in chart. Home medications reviewed; patient Clonidine 0.2 mg BID filled 03/12/22, Alprazolam 1 mg TID filled 03/20/22. Labs have been reviewed; UDS-, BAL<10, LFTs, CT WNL. WBC, Neut elevated. Urine WNL. CBC w/ diff, urinalysis reordered to rule out any underlying medical processes. Medication changes noted below. Benzodiazepine taper initiated. Rule out benzodiazepine withdrawal, delirium diagnoses.   MDD, Recurrent, Severe, w/Psychosis: -Discontinue:   - Zyprexa to 15 mg QHS for psychosis Increase:  - Remeron 30 mg QHS for depression  - Xanax 0.5 mg BID PRN for anxiety - Agitation Protocol: Zyprexa/Ativan/Geodon  Benzodiazepine Taper:   -Start:    -Lorazepam:  - 1 mg BID 10/19 - 0.5 mg BID 10/20 - 0.25 mg BID 10/21   HTN: -Continue:  - Amlodipine to 10 mg daily - Clonidine 0.1 mg BID  GERD -Continue:  - Famotidine 20 mg daily  Constipation -Continue:  - Colace 100 mg daily  Folate Deficiency: -Continue: - Folic Acid 1 mg daily  Hypokalemia Resolved: -One time dose Kdur 40 mEq -K now 4.1 from 10/17  -Continue: - Ensure 237 mg BID  - PRN's: Tylenol, Maalox, Atarax, Milk of Magnesia  Labs: 10/13- CMP: WNL except for K: 3.0,  BUN < 5,  Total Protein: 6.4,  CBC: WNL except  WBC: 11.8,  RBC: 5.27,  Hem: 16.7,  MCHC: 36.5,  Neut Abs: 8.4, Mono Abs: 1.1, Lipid Panel: WNL except Trig: 193,  A1c: 4.8, TSH: 1.902,  UDS: Benzo pos,  EtOH: Neg,  EKG: Sinus Tach with Qtc: 439 10/14- UA: WNL; Reordered 10/19 10/15-  RPR: Neg,  Folate: 5.2,  Ceruloplasmin: 15.6,  HIV negative 10/19: Reordered UA, CBC w/ diff,     Inda Merlin, NP 04/17/2022, 2:18 PMPatient ID: Marie Diaz, female   DOB: 1966-03-12, 56 y.o.   MRN: [id number] Patient ID: Marie Diaz, female   DOB: [birth year], 56 y.o.   MRN: [id number] Patient ID: Marie Diaz, female   DOB: July 08, 1965, 56 y.o.   MRN: [id number]

## 2022-04-17 NOTE — Progress Notes (Signed)
Patient appears drowsy due to medications given last night per agitation protocol. Patient denies SI/HI/AVH. Patient complied with morning medication with no reported side effects. Pt has been minimal and sleepy. Patient remains safe on Q70min checks and contracts for safety.      04/17/22 1000  Psych Admission Type (Psych Patients Only)  Admission Status Involuntary  Psychosocial Assessment  Patient Complaints Anxiety;Confusion  Eye Contact Fair  Facial Expression Anxious;Animated;Worried  Affect Anxious  Radiation protection practitioner;Tangential  Interaction Demanding  Motor Activity Fidgety;Tremors;Restless  Appearance/Hygiene Unremarkable  Behavior Characteristics Anxious;Fidgety;Restless  Mood Anxious;Suspicious  Thought Process  Coherency Disorganized  Content Blaming others  Delusions None reported or observed  Perception WDL  Hallucination None reported or observed  Judgment Poor  Confusion None  Danger to Self  Current suicidal ideation? Denies  Self-Injurious Behavior No self-injurious ideation or behavior indicators observed or expressed   Agreement Not to Harm Self Yes  Description of Agreement verbal  Danger to Others  Danger to Others None reported or observed

## 2022-04-17 NOTE — Progress Notes (Signed)
   04/16/22 2020  Psych Admission Type (Psych Patients Only)  Admission Status Involuntary  Psychosocial Assessment  Patient Complaints Anxiety;Confusion  Eye Contact Fair  Facial Expression Anxious;Animated;Worried  Affect Anxious  Radiation protection practitioner;Tangential  Interaction Demanding  Motor Activity Fidgety;Tremors;Restless  Appearance/Hygiene Unremarkable  Behavior Characteristics Anxious;Fidgety;Restless  Mood Anxious;Suspicious  Thought Process  Coherency Disorganized  Content Blaming others  Delusions None reported or observed  Perception Hallucinations  Hallucination Auditory;Visual  Judgment Poor  Confusion Moderate  Danger to Self  Current suicidal ideation? Denies  Self-Injurious Behavior No self-injurious ideation or behavior indicators observed or expressed   Agreement Not to Harm Self Yes  Description of Agreement Verbal Contract  Danger to Others  Danger to Others None reported or observed

## 2022-04-17 NOTE — Progress Notes (Signed)
Pt has been restless, confused, irritable, hallucintaing and paraniod throughtout the shift prior to IM medication.   2351: Pt was given PRN hydroxyzine and Zyprexa for agitation and anxiety. Pt was restless, and stated she cannot fall alseep. She was attempting to walk into other patients room and became argumentative and agitated when redirected back to her room. Pt was given snacks and fluids. Pt verbalized all her physiological needs were met.   0030: Response to PRN medication unchanged. Pt remained restless and agitated. Pt began attempting to pour soap within the air conditioning unit. Pt was redirectable but behavior would continue after several minutes. Pt began saying she was see things in the room that was not there and became increasing paranoid towards staff.  1947: Pt given PRN Zyprexa for unrelieved agitation.    0130: Response to PRN Zyprexa was ineffective. The patient continued with behavior receiving constant redirection and prompting by staff.   0200: Pt continued attempting to alter equipment in room. Pt pressed the system wide emergency alarm in her room. She began yelling and screaming to save the baby in her room that was not there. Became upset when staff attempted to reorient her. Provider made aware of the patient's behavior and increasing agitation.   72: Provider made RN aware to give IM PRN medication for agitation. Pt voluntarily accepted IM PRN medication for agitation.   0315: Pt had an episode of urinary incontinent. She took off all her clothes and placed it on the bathroom floor. While assisting the patient to complete hygiene to put clean clothes on, she was reaching for objects in the air that was not there. She remained tangential while attempting to talk to her. Pt placed in clean gown and assisted to her bed.   0337: Vitals taken and was WDL (see vitals). Fall risk band and socks placed on pt due to drowsiness from meds. Fall risk sticker placed on door.    0340: Pt fell asleep.

## 2022-04-18 DIAGNOSIS — F323 Major depressive disorder, single episode, severe with psychotic features: Secondary | ICD-10-CM | POA: Diagnosis not present

## 2022-04-18 LAB — CBC WITH DIFFERENTIAL/PLATELET
Abs Immature Granulocytes: 0.02 10*3/uL (ref 0.00–0.07)
Basophils Absolute: 0.1 10*3/uL (ref 0.0–0.1)
Basophils Relative: 1 %
Eosinophils Absolute: 0.1 10*3/uL (ref 0.0–0.5)
Eosinophils Relative: 1 %
HCT: 45.2 % (ref 36.0–46.0)
Hemoglobin: 14.9 g/dL (ref 12.0–15.0)
Immature Granulocytes: 0 %
Lymphocytes Relative: 36 %
Lymphs Abs: 2.8 10*3/uL (ref 0.7–4.0)
MCH: 31.1 pg (ref 26.0–34.0)
MCHC: 33 g/dL (ref 30.0–36.0)
MCV: 94.4 fL (ref 80.0–100.0)
Monocytes Absolute: 0.6 10*3/uL (ref 0.1–1.0)
Monocytes Relative: 8 %
Neutro Abs: 4.2 10*3/uL (ref 1.7–7.7)
Neutrophils Relative %: 54 %
Platelets: 257 10*3/uL (ref 150–400)
RBC: 4.79 MIL/uL (ref 3.87–5.11)
RDW: 13.2 % (ref 11.5–15.5)
WBC: 7.8 10*3/uL (ref 4.0–10.5)
nRBC: 0 % (ref 0.0–0.2)

## 2022-04-18 LAB — URINALYSIS, COMPLETE (UACMP) WITH MICROSCOPIC
Bilirubin Urine: NEGATIVE
Glucose, UA: 50 mg/dL — AB
Hgb urine dipstick: NEGATIVE
Ketones, ur: NEGATIVE mg/dL
Leukocytes,Ua: NEGATIVE
Nitrite: NEGATIVE
Protein, ur: NEGATIVE mg/dL
Specific Gravity, Urine: 1.02 (ref 1.005–1.030)
pH: 6 (ref 5.0–8.0)

## 2022-04-18 LAB — VITAMIN B12: Vitamin B-12: 233 pg/mL (ref 180–914)

## 2022-04-18 NOTE — Progress Notes (Signed)
[facility] MD Progress Note  04/18/2022 2:31 PM KALANA YUST  MRN:  [id number]  HPI:  JERALDINE PRIMEAU is a 56 yr old female who presented to [facility] on 10/13 due to depression, SI, and command auditory hallucinations, she was admitted to [facility] on 10/14.  PPHx is significant for Anxiety and no history of Suicide Attempts, Self Injurious Behavior, or Psychiatric Hospitalizations.   24 hr chart review: BP within the past 24 hrs have had periods of elevations, and tachycardia at 121 earlier today morning. Nursing staff asked to recheck V/Ss. Pt has continued to be compliant with her scheduled medications, and has not required any agitation protocol medications or anti anxiety medications in the past 24 hrs.  She has remained isolative to her room and not attending unit group activities as  per nursing documentation. As per flow sheets, she slept for a total of 9.5 hrs last night.  Today's nursing assessment note: Today, patient presents with a depressed mood and affect is congruent.  She verbalizes suicidal ideation, and denies having a plan and denies having any intent.  She states "I cannot take it anymore."  She is able to verbally contract for safety while here at the [facility].  She verbalizes feelings of hopelessness, helplessness, worthlessness, and states that she feels down and depressed and sad.  Patient rates her depression today as a 10 (10 being worst), and rates anxiety as a 10 (10 being worse).  Patient also endorses auditory hallucinations of one voice saying nonspecific things.  She states that she is unable to make out what the voices say, and states that the voice started 1 month ago.  She states that the last time she heard the voices was last night. She also presents today with paranoia and states that she feels as though someone on the outside of this hospital is out to harm her.  She verbalizes feeling safe here at the hospital.  Patient reports that last night she slept "good".  She reports a fair  appetite, and denies being in any physical pain.  She denies having any issues with her bowel movements and states that she had a bowel movement yesterday.  Patient is alert oriented to person, place, and situation.  She knows that it is October 2023 but is unable to state the exact date.  She knows that the current president is Biden.  Order placed for a Mini-Mental state examination to ascertain issues with patient's memory.  Remeron was increased yesterday to 30 mg for depressive symptoms, sleep, and appetite stimulation.  Antipsychotic was changed yesterday from Zyprexa to Haldol 5 mg twice daily for management of psychosis.  We will continue Haldol 5 mg twice daily for management of psychosis and we will continue other medications as listed below. Pt denies any current medication related side effects. No TD/EPS type symptoms found on assessment, and pt denies any feelings of stiffness. AIMS: 0.   Principal Problem: Major depressive disorder, single episode with psychotic features (HCC) Diagnosis: Principal Problem:   Major depressive disorder, single episode with psychotic features (HCC)  Total Time spent with patient:  I personally spent 30 minutes on the unit in direct patient care. The direct patient care time included face-to-face time with the patient, reviewing the patient's chart, communicating with other professionals, and coordinating care. Greater than 50% of this time was spent in counseling or coordinating care with the patient regarding goals of hospitalization, psycho-education, and discharge planning needs.  Past Psychiatric History: Anxiety and no  history of Suicide Attempts, Self Injurious Behavior, or Psychiatric Hospitalizations.   Past Medical History:  Past Medical History:  Diagnosis Date   Anxiety    Depression    Gastric ulcer    Gastritis    Hypertension     Past Surgical History:  Procedure Laterality Date   ABDOMINAL HYSTERECTOMY     BIOPSY  08/29/2019    Procedure: BIOPSY;  Surgeon: West Bali, MD;  Location: [facility];  Service: Endoscopy;;   BIOPSY  02/26/2021   Procedure: BIOPSY;  Surgeon: Lanelle Bal, DO;  Location: [facility];  Service: Endoscopy;;   CHOLECYSTECTOMY     ESOPHAGOGASTRODUODENOSCOPY N/A 08/29/2019   normal esophagus, localized moderate inflammation with adherent blood, congestion, erosions, and friability on greater curvature of stomach. Biopsy with chronic gastritis, negative H.pylori.    ESOPHAGOGASTRODUODENOSCOPY (EGD) WITH PROPOFOL N/A 02/26/2021   gastritis and pathology with reactive gastropathy and negative H.pylori   HEMORRHOID SURGERY     Family History:  Family History  Problem Relation Age of Onset   Colon cancer Neg Hx    Colon polyps Neg Hx    Family Psychiatric  History: Reports None Social History:  Social History   Substance and Sexual Activity  Alcohol Use No     Social History   Substance and Sexual Activity  Drug Use No    Social History   Socioeconomic History   Marital status: Married    Spouse name: Not on file   Number of children: Not on file   Years of education: Not on file   Highest education level: Not on file  Occupational History   Not on file  Tobacco Use   Smoking status: Every Day    Packs/day: 0.50    Types: Cigarettes   Smokeless tobacco: Never  Vaping Use   Vaping Use: Former  Substance and Sexual Activity   Alcohol use: No   Drug use: No   Sexual activity: Yes  Other Topics Concern   Not on file  Social History Narrative   [phone]), kids(2). 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 SHE'S AN ONLY CHILD.   Social Determinants of Health   Financial Resource Strain: Not on file  Food Insecurity: Not on file  Transportation Needs: Not on file  Physical Activity: Not on file  Stress: Not on file  Social Connections: Not on file   Additional Social History:     Sleep: Poor  Appetite:   Fair  Current Medications: Current Facility-Administered Medications  Medication Dose Route Frequency Provider Last Rate Last Admin   acetaminophen (TYLENOL) tablet 650 mg  650 mg Oral Q6H PRN Lauree Chandler, NP       ALPRAZolam Prudy Feeler) tablet 0.5 mg  0.5 mg Oral BID PRN Thalia Party, MD   0.5 mg at 04/16/22 2019   alum & mag hydroxide-simeth (MAALOX/MYLANTA) 200-200-20 MG/5ML suspension 30 mL  30 mL Oral Q4H PRN Lauree Chandler, NP       amLODipine (NORVASC) tablet 10 mg  10 mg Oral Daily Starleen Blue, NP   10 mg at 04/18/22 0817   benztropine (COGENTIN) tablet 0.5 mg  0.5 mg Oral BID PRN Leevy-Johnson, Brooke A, NP       cloNIDine (CATAPRES) tablet 0.1 mg  0.1 mg Oral BID Thalia Party, MD   0.1 mg at 04/18/22 0817   docusate sodium (COLACE) capsule 100 mg  100 mg Oral Daily  Starleen Blue, NP   100 mg at 04/18/22 3903   famotidine (PEPCID) tablet 20 mg  20 mg Oral Daily Starleen Blue, NP   20 mg at 04/18/22 0092   feeding supplement (ENSURE ENLIVE / ENSURE PLUS) liquid 237 mL  237 mL Oral BID BM Lauree Chandler, NP   237 mL at 04/18/22 3300   folic acid (FOLVITE) tablet 1 mg  1 mg Oral Daily Thalia Party, MD   1 mg at 04/18/22 7622   haloperidol (HALDOL) tablet 5 mg  5 mg Oral BID Leevy-Johnson, Brooke A, NP   5 mg at 04/18/22 6333   hydrOXYzine (ATARAX) tablet 25 mg  25 mg Oral TID PRN Starleen Blue, NP   25 mg at 04/16/22 2351   [START ON 04/19/2022] LORazepam (ATIVAN) tablet 0.25 mg  0.25 mg Oral BID Leevy-Johnson, Brooke A, NP       magnesium hydroxide (MILK OF MAGNESIA) suspension 30 mL  30 mL Oral Daily PRN Lauree Chandler, NP       mirtazapine (REMERON) tablet 30 mg  30 mg Oral QHS Leevy-Johnson, Brooke A, NP   30 mg at 04/17/22 2107   OLANZapine zydis (ZYPREXA) disintegrating tablet 5 mg  5 mg Oral Q8H PRN Lauree Chandler, NP   5 mg at 04/16/22 2351   traZODone (DESYREL) tablet 50 mg  50 mg Oral QHS Leevy-Johnson, Brooke A, NP   50 mg at 04/17/22 2107    Lab  Results:  Results for orders placed or performed during the hospital encounter of 04/11/22 (from the past 48 hour(s))  Urinalysis, Complete w Microscopic Urine, Clean Catch     Status: Abnormal   Collection Time: 04/17/22  9:21 PM  Result Value Ref Range   Color, Urine YELLOW YELLOW   APPearance HAZY (A) CLEAR   Specific Gravity, Urine 1.020 1.005 - 1.030   pH 6.0 5.0 - 8.0   Glucose, UA 50 (A) NEGATIVE mg/dL   Hgb urine dipstick NEGATIVE NEGATIVE   Bilirubin Urine NEGATIVE NEGATIVE   Ketones, ur NEGATIVE NEGATIVE mg/dL   Protein, ur NEGATIVE NEGATIVE mg/dL   Nitrite NEGATIVE NEGATIVE   Leukocytes,Ua NEGATIVE NEGATIVE   RBC / HPF 0-5 0 - 5 RBC/hpf   WBC, UA 6-10 0 - 5 WBC/hpf   Bacteria, UA RARE (A) NONE SEEN   Squamous Epithelial / LPF 11-20 0 - 5   Mucus PRESENT    Hyaline Casts, UA PRESENT     Comment: Performed at [facility], [address], [place], [place] [address]  Vitamin B12     Status: None   Collection Time: 04/18/22  6:29 AM  Result Value Ref Range   Vitamin B-12 233 180 - 914 pg/mL    Comment: (NOTE) This assay is not validated for testing neonatal or myeloproliferative syndrome specimens for Vitamin B12 levels. Performed at [facility], [address], [place], [place] [address]   CBC with Differential     Status: None   Collection Time: 04/18/22  6:29 AM  Result Value Ref Range   WBC 7.8 4.0 - 10.5 K/uL   RBC 4.79 3.87 - 5.11 MIL/uL   Hemoglobin 14.9 12.0 - 15.0 g/dL   HCT 37.3 42.8 - 76.8 %   MCV 94.4 80.0 - 100.0 fL   MCH 31.1 26.0 - 34.0 pg   MCHC 33.0 30.0 - 36.0 g/dL   RDW 11.5 72.6 - 20.3 %   Platelets 257 150 - 400 K/uL  nRBC 0.0 0.0 - 0.2 %   Neutrophils Relative % 54 %   Neutro Abs 4.2 1.7 - 7.7 K/uL   Lymphocytes Relative 36 %   Lymphs Abs 2.8 0.7 - 4.0 K/uL   Monocytes Relative 8 %   Monocytes Absolute 0.6 0.1 - 1.0 K/uL   Eosinophils Relative 1 %   Eosinophils Absolute 0.1 0.0 - 0.5 K/uL    Basophils Relative 1 %   Basophils Absolute 0.1 0.0 - 0.1 K/uL   Immature Granulocytes 0 %   Abs Immature Granulocytes 0.02 0.00 - 0.07 K/uL    Comment: Performed at [facility], [address] [place], [place] [id number]    Blood Alcohol level:  Lab Results  Component Value Date   ETH <10 04/11/2022   ETH <10 06/11/2019    Metabolic Disorder Labs: Lab Results  Component Value Date   HGBA1C 4.8 04/11/2022   MPG 91.06 04/11/2022   No results found for: "PROLACTIN" Lab Results  Component Value Date   CHOL 177 04/11/2022   TRIG 193 (H) 04/11/2022   HDL 41 04/11/2022   CHOLHDL 4.3 04/11/2022   VLDL 39 04/11/2022   LDLCALC 97 04/11/2022    Physical Findings: AIMS: 0 CIWA:  n/a COWS: n/a    Musculoskeletal: Strength & Muscle Tone: decreased Gait & Station: normal Patient leans: N/A  Psychiatric Specialty Exam:  Presentation  General Appearance:  Appropriate for Environment; Disheveled  Eye Contact: Fair  Speech: Clear and Coherent  Speech Volume: Normal  Handedness:Right   Mood and Affect  Mood: Depressed; Anxious  Affect: Congruent   Thought Process  Thought Processes: Coherent  Descriptions of Associations:Intact  Orientation:Full (Time, Place and Person)  Thought Content:Illogical  History of Schizophrenia/Schizoaffective disorder:No  Duration of Psychotic Symptoms:Less than six months  Hallucinations:Hallucinations: Auditory Description of Auditory Hallucinations: voices telling me to kill myself  Ideas of Reference:Paranoia  Suicidal Thoughts:Suicidal Thoughts: Yes, Active SI Active Intent and/or Plan: Without Intent; Without Plan  Homicidal Thoughts:Homicidal Thoughts: No   Sensorium  Memory: Immediate Good  Judgment: Poor  Insight: Poor   Executive Functions  Concentration: Fair  Attention Span: Fair  Recall: Fair  Fund of Knowledge: Fair  Language: Fair   Psychomotor Activity   Psychomotor Activity:Psychomotor Activity: Normal   Assets  Assets: Housing; Resilience   Sleep  Sleep:Sleep: Good   Physical Exam: Physical Exam Vitals and nursing note reviewed.  Constitutional:      General: She is not in acute distress.    Appearance: Normal appearance. She is normal weight. She is ill-appearing. She is not toxic-appearing.  HENT:     Head: Normocephalic and atraumatic.  Pulmonary:     Effort: Pulmonary effort is normal.  Musculoskeletal:        General: Normal range of motion.  Neurological:     General: No focal deficit present.     Mental Status: She is alert and oriented to person, place, and time.    Review of Systems  Constitutional: Negative.   HENT: Negative.    Eyes: Negative.   Respiratory:  Negative for cough and shortness of breath.   Cardiovascular:  Negative for chest pain.  Gastrointestinal:  Negative for abdominal pain, constipation, diarrhea, nausea and vomiting.  Skin: Negative.   Neurological:  Positive for weakness (mild). Negative for dizziness and headaches (mild).  Psychiatric/Behavioral:  Positive for depression, hallucinations and suicidal ideas. Negative for substance abuse. The patient is nervous/anxious and has insomnia.    Blood pressure 124/75,  pulse 84, temperature 98.2 F (36.8 C), temperature source Oral, resp. rate 18, height 5\' 1"  (1.549 m), SpO2 97 %. Body mass index is 18.89 kg/m.  Treatment Plan Summary: Daily contact with patient to assess and evaluate symptoms and progress in treatment and Medication management PLAN Safety and Monitoring: Involuntary admission to inpatient psychiatric unit for safety, stabilization and treatment Daily contact with patient to assess and evaluate symptoms and progress in treatment Patient's case to be discussed in multi-disciplinary team meeting Observation Level : q15 minute checks Vital signs: q12 hours Precautions:Safety  MDD, Recurrent, Severe,  w/Psychosis: -Continue Remeron 30 mg QHS for depression -Continue Haldol 5 mg BID for psychosis -Previously discontinued Zyprexa to 15 mg d/t lack of efficacy -Continue Xanax 0.5 mg BID PRN for anxiety -Continue Agitation Protocol: Zyprexa/Ativan/Geodon  HTN: -Continue Amlodipine to 10 mg daily -Continue Clonidine 0.1 mg BID  GERD -Continue Famotidine 20 mg daily  Constipation -Continue Colace 100 mg daily  Folate Deficiency: -Continue Folic Acid 1 mg daily  Hypokalemia Resolved: -One time dose Kdur 40 mEq -K now 4.1 from 10/17  -Continue Ensure 237 mg BID -Continue PRN's: Tylenol, Maalox, Atarax, Milk of Magnesia  Labs: 10/13- CMP: WNL except for K: 3.0,  BUN < 5,  Total Protein: 6.4,  CBC: WNL except WBC: 11.8,  RBC: 5.27,  Hem: 16.7,  MCHC: 36.5,  Neut Abs: 8.4, Mono Abs: 1.1, Lipid Panel: WNL except Trig: 193,  A1c: 4.8, TSH: 1.902,  UDS: Benzo pos,  EtOH: Neg,  EKG: Sinus Tach with Qtc: 439 10/14- UA: WNL with exception of straw color  10/15-  RPR: Neg,  Folate: 5.2,  Ceruloplasmin: 15.6,  HIV nonreactive  Nicholes Rough, NP 04/18/2022, 2:31 PMPatient ID: Kathlene Cote, female   DOB: Apr 26, 1966, 56 y.o.

## 2022-04-18 NOTE — B[facility] Group Notes (Signed)
Pt did not attend wrap up group this evening. Pt was in their room resting.  

## 2022-04-18 NOTE — Group Note (Signed)
Recreation Therapy Group Note   Group Topic:Team Building  Group Date: 04/18/2022 Start Time: 1000 End Time: 7829 Facilitators: Lillyana Majette-McCall, LRT,CTRS Location: [address] Dayroom   Goal Area(s) Addresses:  Patient will effectively work with peer towards shared goal.  Patient will identify skills used to make activity successful.  Patient will identify how skills used during activity can be applied to reach post d/c goals.    Group Description: [employer]. In teams of 5-6, patients were given 11 craft pipe cleaners. Using the materials provided, patients were instructed to compete again the opposing team(s) to build the tallest free-standing structure from floor level. The activity was timed; difficulty increased by Probation officer as Pharmacist, hospital continued.  Systematically resources were removed with additional directions for example, placing one arm behind their back, working in silence, and shape stipulations. LRT facilitated post-activity discussion reviewing team processes and necessary communication skills involved in completion. Patients were encouraged to reflect how the skills utilized, or not utilized, in this activity can be incorporated to positively impact support systems post discharge.   Affect/Mood: N/A   Participation Level: Did not attend    Clinical Observations/Individualized Feedback:     Plan: Continue to engage patient in RT group sessions 2-3x/week.   Marie Diaz, LRT,CTRS 04/18/2022 11:33 AM

## 2022-04-18 NOTE — Progress Notes (Signed)
Pt observed to be irritable on initial encounter "I need to go home, I want to leave". Presents with blunted affect, pressured speech, fair eye contact and tremulous on interactions. Reports AH "Yeah, I hear voices and they telling me I'm crazy". Denies VH, SI and pain when assessed. Pt received PRN Vistaril 25 mg PO at 1102 for continued anxiety with desired effect when reassessed at 1200. Remains isolative to room except for meals and medications. Fluids offered, given and tolerated well. Safety checks maintained at Q 15 minutes intervals. Verbal education provided on current treatment regimen, effects monitored. Continued support, encouragement and reassurance provided to pt.

## 2022-04-18 NOTE — [facility] Group Notes (Signed)
Spirituality group facilitated by Chaplain Katy Elhadj Girton, BCC.  Group Description: Group focused on topic of community. Patients participated in facilitated discussion around topic, connecting with one another around experiences and definitions for community. Group members engaged with visual explorer photos, reflecting on what community looks like for them today. Group engaged in discussion around how their definitions of community are present today in hospital.  Modalities: Psycho-social ed, Adlerian, Narrative, MI  Patient Progress: Did not attend.  

## 2022-04-18 NOTE — Group Note (Signed)
Type of Therapy and Topic:  Group Therapy:  Stress Management °  °Participation Level:  Did Not Attend  °  °Description of Group:  Patients in this group were introduced to the idea of stress and encouraged to discuss negative and positive ways to manage stress. Patients discussed specific stressors that they have in their life right now and the physical signs and symptoms associated with that stress.  Patient encouraged to come up with positive changes to assist with the stress upon discharge in order to prevent future hospitalizations.   They also worked as a group on developing a specific plan for several patients to deal with stressors through boundary-setting, psychoeducation and self care techniques °  °Therapeutic Goals: °  °            1)  To discuss the positive and negative impacts of stress °            2)  identify signs and symptoms of stress °            3)  generate ideas for stress management °            4)  offer mutual support to others regarding stress management °            5)  Developing plans for ways to manage specific stressors upon discharge °            °  °Summary of Patient Progress:  Did not attend °  °Therapeutic Modalities:   °Motivational Interviewing °Brief Solution-Focused Therapy °

## 2022-04-18 NOTE — Progress Notes (Signed)
   04/18/22 0610  Vitals  Temp 98.2 F (36.8 C)  Temp Source Oral  BP (!) 124/92  MAP (mmHg) 99  BP Location Left Arm  BP Method Automatic  Patient Position (if appropriate) Sitting  Pulse Rate (!) 121  Pulse Rate Source Monitor  Oxygen Therapy  SpO2 97 %   Attempted to check patient's blood pressure manually. Unit manual cuffs are too large for patient's arm. Pediatric cuff used via dinamap.

## 2022-04-19 MED ORDER — TRAZODONE HCL 50 MG PO TABS
75.0000 mg | ORAL_TABLET | Freq: Every day | ORAL | Status: DC
  Administered 2022-04-19 – 2022-04-21 (×3): 75 mg via ORAL
  Filled 2022-04-19 (×6): qty 1

## 2022-04-19 MED ORDER — IRBESARTAN 75 MG PO TABS
37.5000 mg | ORAL_TABLET | Freq: Every day | ORAL | Status: DC
  Administered 2022-04-19 – 2022-04-22 (×4): 37.5 mg via ORAL
  Filled 2022-04-19 (×2): qty 1
  Filled 2022-04-19 (×6): qty 0.5

## 2022-04-19 MED ORDER — CLONIDINE HCL 0.1 MG PO TABS
0.1000 mg | ORAL_TABLET | Freq: Every day | ORAL | Status: AC
  Administered 2022-04-20 – 2022-04-21 (×2): 0.1 mg via ORAL
  Filled 2022-04-19 (×2): qty 1

## 2022-04-19 NOTE — [facility] Group Notes (Signed)
Pt. Did not attend group. 

## 2022-04-19 NOTE — Group Note (Signed)
  B[facility]/BMU LCSW Group Therapy Note  Date/Time:  04/19/2022 11:15AM-12:00PM  Type of Therapy and Topic:  Group Therapy:  Feelings About Hospitalization  Participation Level:  Did Not Attend   Description of Group This process group involved patients discussing their feelings related to being hospitalized, as well as the benefits they see to being in the hospital.  These feelings and benefits were itemized.  The group then brainstormed specific ways in which they could seek those same benefits when they discharge and return home.  Therapeutic Goals Patient will identify and describe positive and negative feelings related to hospitalization Patient will verbalize benefits of hospitalization to themselves personally Patients will brainstorm together ways they can obtain similar benefits in the outpatient setting, identify barriers to wellness and possible solutions  Summary of Patient Progress:  Patient was invited to group, did not attend.   Therapeutic Modalities Cognitive Behavioral Therapy Motivational Interviewing    Selmer Dominion, LCSW 04/19/2022, 3:24 PM

## 2022-04-19 NOTE — Progress Notes (Signed)
[facility] MD Progress Note  04/19/2022 2:55 PM JOBY HERSHKOWITZ  MRN:  [id number]  HPI:  Marie Diaz is a 56 yr old female who presented to [facility] on 10/13 due to depression, SI, and command auditory hallucinations, she was admitted to [facility] on 10/14.  PPHx is significant for Anxiety and no history of Suicide Attempts, Self Injurious Behavior, or Psychiatric Hospitalizations.   24 hr chart review: BP within the past 24 hrs has been elevated with SBP in the 140s to 150s and DBP in the 100s. Pt has remained asymptomatic. Nursing staff asked to recheck V/Ss. Pt has continued to be compliant with her scheduled medications, and required the following PRN meds in the past 24s: Hydroxyzine 25 mg for anxiety, and Cogentin 25 mg.  She has remained isolative to her room and not attending unit group activities as  per nursing documentation.   Today's nursing assessment note: Today, patient continues to present with a depressed mood and affect is congruent.  She presents today with passive SI and states that she would not care if she slept and never woke up because all of her problems will be gone.  She denies any plans and denies any intent to harm herself while here at the hospital, and is verbally contracting for safety.  She presents today with thought thought insertion, thought withdrawal and paranoia; she states that people reports suicidal thoughts and her brains and take them out at times.  She is unsure who does this to her.  Patient reports that her sleep quality last night was poor, and reports appetite as being fair.  She reports a fair appetite, and denies being in any physical pain.  She denies any medication related side effects. Patient is continuing to be alert & oriented to person, place, and situation.  She knows that it is October 2023 but is unable to state the exact day of the week.  She knows that the current president is Jackquline Bosch, and knows that she is at the hospital because she is depressed, and had  suicide thoughts .  Order placed for a Mini-Mental state examination to ascertain issues with patient's memory, but this will most likely not be completed until Monday 10/23.   We are increasing Trazodone to 75 mg nightly for insomnia. Keeping Haldol at 5 mg BID for management of psychosis, and keeping Remeron at 30 mg nightly for insomnia, appetite stimulation and depressive symptoms. No TD/EPS type symptoms found on assessment, and pt denies any feelings of stiffness. AIMS: 0.   Pt's BP is continuing to be elevated even though  she has been asymptomatic. Writer consulted with cardiology on 10/18 regarding pt's elevated Bps, and it was uncertain at the time if the Bps were accurate as there were concerns that the right BP cuff might not have been used at the times that those Bps were being recorded. Below his an excerpt from that note:  "Call placed to Dr Herbie Baltimore with medicine regarding fluctuations in blood pressures. Dr. Herbie Baltimore recommended starting Valsartan 40 mg daily in addition to Norvasc, and slowly tapering pt off the Clonidine. There are concerns that pt's vital signs as in the flow sheets might not be accurate, and right cuff might not be in use, as there are reports of pt being restless during vital signs checks, and not sitting still which might be leading to false readings. Orders have been placed for vitals Q 4 Hs while awake, and also for staff to do manual Bps and  redirect pt to sit still when vitals are being checked. We will revisit BP meds after checking vitals for 24 hrs".  Since Bps are staying mostly elevated, order has been placed for Irbesartan 37.5 mg daily, which is the hospital recommended alternative for Valsartan 40 mg. This is be in addition to the pt's norvasc 10 mg. We will also taper off the Clonidine as per Dr. Herbie Baltimore since this medication will not adequately control BP in the long run & abrupt cessation can cause rebound hypertension. Clonidine changed to daily at this  time x 2 doses, then stop. We will continue to monitor.  Principal Problem: Major depressive disorder, single episode with psychotic features (HCC) Diagnosis: Principal Problem:   Major depressive disorder, single episode with psychotic features (HCC)  Total Time spent with patient:  I personally spent 30 minutes on the unit in direct patient care. The direct patient care time included face-to-face time with the patient, reviewing the patient's chart, communicating with other professionals, and coordinating care. Greater than 50% of this time was spent in counseling or coordinating care with the patient regarding goals of hospitalization, psycho-education, and discharge planning needs.  Past Psychiatric History: Anxiety and no history of Suicide Attempts, Self Injurious Behavior, or Psychiatric Hospitalizations.   Past Medical History:  Past Medical History:  Diagnosis Date   Anxiety    Depression    Gastric ulcer    Gastritis    Hypertension     Past Surgical History:  Procedure Laterality Date   ABDOMINAL HYSTERECTOMY     BIOPSY  08/29/2019   Procedure: BIOPSY;  Surgeon: West Bali, MD;  Location: [facility];  Service: Endoscopy;;   BIOPSY  02/26/2021   Procedure: BIOPSY;  Surgeon: Lanelle Bal, DO;  Location: [facility];  Service: Endoscopy;;   CHOLECYSTECTOMY     ESOPHAGOGASTRODUODENOSCOPY N/A 08/29/2019   normal esophagus, localized moderate inflammation with adherent blood, congestion, erosions, and friability on greater curvature of stomach. Biopsy with chronic gastritis, negative H.pylori.    ESOPHAGOGASTRODUODENOSCOPY (EGD) WITH PROPOFOL N/A 02/26/2021   gastritis and pathology with reactive gastropathy and negative H.pylori   HEMORRHOID SURGERY     Family History:  Family History  Problem Relation Age of Onset   Colon cancer Neg Hx    Colon polyps Neg Hx    Family Psychiatric  History: Reports None Social History:  Social History   Substance and  Sexual Activity  Alcohol Use No     Social History   Substance and Sexual Activity  Drug Use No    Social History   Socioeconomic History   Marital status: Married    Spouse name: Not on file   Number of children: Not on file   Years of education: Not on file   Highest education level: Not on file  Occupational History   Not on file  Tobacco Use   Smoking status: Every Day    Packs/day: 0.50    Types: Cigarettes   Smokeless tobacco: Never  Vaping Use   Vaping Use: Former  Substance and Sexual Activity   Alcohol use: No   Drug use: No   Sexual activity: Yes  Other Topics Concern   Not on file  Social History Narrative   [phone]), kids(2). 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 SHE'S AN ONLY CHILD.   Social Determinants of Health   Financial Resource Strain: Not on file  Food Insecurity: Not  on file  Transportation Needs: Not on file  Physical Activity: Not on file  Stress: Not on file  Social Connections: Not on file   Additional Social History:     Sleep: Poor  Appetite:  Fair  Current Medications: Current Facility-Administered Medications  Medication Dose Route Frequency Provider Last Rate Last Admin   acetaminophen (TYLENOL) tablet 650 mg  650 mg Oral Q6H PRN Lauree Chandler, NP       ALPRAZolam Prudy Feeler) tablet 0.5 mg  0.5 mg Oral BID PRN Thalia Party, MD   0.5 mg at 04/16/22 2019   alum & mag hydroxide-simeth (MAALOX/MYLANTA) 200-200-20 MG/5ML suspension 30 mL  30 mL Oral Q4H PRN Lauree Chandler, NP       amLODipine (NORVASC) tablet 10 mg  10 mg Oral Daily Starleen Blue, NP   10 mg at 04/19/22 8299   benztropine (COGENTIN) tablet 0.5 mg  0.5 mg Oral BID PRN Leevy-Johnson, Nehemiah Settle A, NP   0.5 mg at 04/18/22 2029   [START ON 04/20/2022] cloNIDine (CATAPRES) tablet 0.1 mg  0.1 mg Oral Daily Starleen Blue, NP       docusate sodium (COLACE) capsule 100 mg  100 mg Oral Daily Starleen Blue, NP   100 mg  at 04/19/22 0825   famotidine (PEPCID) tablet 20 mg  20 mg Oral Daily Starleen Blue, NP   20 mg at 04/19/22 3716   feeding supplement (ENSURE ENLIVE / ENSURE PLUS) liquid 237 mL  237 mL Oral BID BM Lauree Chandler, NP   237 mL at 04/18/22 1400   folic acid (FOLVITE) tablet 1 mg  1 mg Oral Daily Thalia Party, MD   1 mg at 04/19/22 9678   haloperidol (HALDOL) tablet 5 mg  5 mg Oral BID Leevy-Johnson, Brooke A, NP   5 mg at 04/19/22 9381   hydrOXYzine (ATARAX) tablet 25 mg  25 mg Oral TID PRN Starleen Blue, NP   25 mg at 04/18/22 2029   irbesartan (AVAPRO) tablet 37.5 mg  37.5 mg Oral Daily Starleen Blue, NP       LORazepam (ATIVAN) tablet 0.25 mg  0.25 mg Oral BID Leevy-Johnson, Brooke A, NP   0.25 mg at 04/19/22 0827   magnesium hydroxide (MILK OF MAGNESIA) suspension 30 mL  30 mL Oral Daily PRN Lauree Chandler, NP       mirtazapine (REMERON) tablet 30 mg  30 mg Oral QHS Leevy-Johnson, Brooke A, NP   30 mg at 04/18/22 2029   OLANZapine zydis (ZYPREXA) disintegrating tablet 5 mg  5 mg Oral Q8H PRN Lauree Chandler, NP   5 mg at 04/16/22 2351   traZODone (DESYREL) tablet 75 mg  75 mg Oral QHS Starleen Blue, NP        Lab Results:  Results for orders placed or performed during the hospital encounter of 04/11/22 (from the past 48 hour(s))  Urinalysis, Complete w Microscopic Urine, Clean Catch     Status: Abnormal   Collection Time: 04/17/22  9:21 PM  Result Value Ref Range   Color, Urine YELLOW YELLOW   APPearance HAZY (A) CLEAR   Specific Gravity, Urine 1.020 1.005 - 1.030   pH 6.0 5.0 - 8.0   Glucose, UA 50 (A) NEGATIVE mg/dL   Hgb urine dipstick NEGATIVE NEGATIVE   Bilirubin Urine NEGATIVE NEGATIVE   Ketones, ur NEGATIVE NEGATIVE mg/dL   Protein, ur NEGATIVE NEGATIVE mg/dL   Nitrite NEGATIVE NEGATIVE   Leukocytes,Ua NEGATIVE NEGATIVE   RBC / HPF  0-5 0 - 5 RBC/hpf   WBC, UA 6-10 0 - 5 WBC/hpf   Bacteria, UA RARE (A) NONE SEEN   Squamous Epithelial / LPF 11-20 0 - 5    Mucus PRESENT    Hyaline Casts, UA PRESENT     Comment: Performed at [facility], [place] [address], [place], [place] [address]  Vitamin B12     Status: None   Collection Time: 04/18/22  6:29 AM  Result Value Ref Range   Vitamin B-12 233 180 - 914 pg/mL    Comment: (NOTE) This assay is not validated for testing neonatal or myeloproliferative syndrome specimens for Vitamin B12 levels. Performed at [facility], [place] [address], [place], [place] [address]   CBC with Differential     Status: None   Collection Time: 04/18/22  6:29 AM  Result Value Ref Range   WBC 7.8 4.0 - 10.5 K/uL   RBC 4.79 3.87 - 5.11 MIL/uL   Hemoglobin 14.9 12.0 - 15.0 g/dL   HCT 45.2 36.0 - 46.0 %   MCV 94.4 80.0 - 100.0 fL   MCH 31.1 26.0 - 34.0 pg   MCHC 33.0 30.0 - 36.0 g/dL   RDW 13.2 11.5 - 15.5 %   Platelets 257 150 - 400 K/uL   nRBC 0.0 0.0 - 0.2 %   Neutrophils Relative % 54 %   Neutro Abs 4.2 1.7 - 7.7 K/uL   Lymphocytes Relative 36 %   Lymphs Abs 2.8 0.7 - 4.0 K/uL   Monocytes Relative 8 %   Monocytes Absolute 0.6 0.1 - 1.0 K/uL   Eosinophils Relative 1 %   Eosinophils Absolute 0.1 0.0 - 0.5 K/uL   Basophils Relative 1 %   Basophils Absolute 0.1 0.0 - 0.1 K/uL   Immature Granulocytes 0 %   Abs Immature Granulocytes 0.02 0.00 - 0.07 K/uL    Comment: Performed at [facility], [place] [address], [place], [place] [address]    Blood Alcohol level:  Lab Results  Component Value Date   ETH <10 04/11/2022   ETH <10 15/17/6160    Metabolic Disorder Labs: Lab Results  Component Value Date   HGBA1C 4.8 04/11/2022   MPG 91.06 04/11/2022   No results found for: "PROLACTIN" Lab Results  Component Value Date   CHOL 177 04/11/2022   TRIG 193 (H) 04/11/2022   HDL 41 04/11/2022   CHOLHDL 4.3 04/11/2022   VLDL 39 04/11/2022   LDLCALC 97 04/11/2022    Physical Findings: AIMS: 0 CIWA:  n/a COWS: n/a    Musculoskeletal: Strength & Muscle  Tone: decreased Gait & Station: normal Patient leans: N/A  Psychiatric Specialty Exam:  Presentation  General Appearance:  Appropriate for Environment; Disheveled  Eye Contact: Good  Speech: Clear and Coherent  Speech Volume: Normal  Handedness:Right   Mood and Affect  Mood: Depressed; Anxious  Affect: Congruent   Thought Process  Thought Processes: Coherent  Descriptions of Associations:Intact  Orientation:Full (Time, Place and Person)  Thought Content:Logical  History of Schizophrenia/Schizoaffective disorder:No  Duration of Psychotic Symptoms:Less than six months  Hallucinations:Hallucinations: None  Ideas of Reference:Paranoia; Percusatory  Suicidal Thoughts:Suicidal Thoughts: Yes, Passive SI Active Intent and/or Plan: Without Intent; Without Plan  Homicidal Thoughts:Homicidal Thoughts: No   Sensorium  Memory: Immediate Good  Judgment: Poor  Insight: Poor   Executive Functions  Concentration: Fair  Attention Span: Fair  Recall: [place] of Knowledge: Fair  Language: Fair   Psychomotor Activity  Psychomotor Activity:Psychomotor Activity: Normal   Assets  Assets: Housing; Resilience   Sleep  Sleep:Sleep: Poor   Physical Exam: Physical Exam Vitals and nursing note reviewed.  Constitutional:      General: She is not in acute distress.    Appearance: Normal appearance. She is normal weight. She is ill-appearing. She is not toxic-appearing.  HENT:     Head: Normocephalic and atraumatic.  Pulmonary:     Effort: Pulmonary effort is normal.  Musculoskeletal:        General: Normal range of motion.  Neurological:     General: No focal deficit present.     Mental Status: She is alert and oriented to person, place, and time.    Review of Systems  Constitutional: Negative.   HENT: Negative.    Eyes: Negative.   Respiratory:  Negative for cough and shortness of breath.   Cardiovascular:  Negative for chest  pain.  Gastrointestinal:  Negative for abdominal pain, constipation, diarrhea, nausea and vomiting.  Skin: Negative.   Neurological:  Positive for weakness (mild). Negative for dizziness and headaches (mild).  Psychiatric/Behavioral:  Positive for depression and suicidal ideas (passive SI). Negative for hallucinations and substance abuse. The patient is nervous/anxious and has insomnia.    Blood pressure (!) 148/113, pulse 99, temperature 98 F (36.7 C), temperature source Oral, resp. rate 20, height 5\' 1"  (1.549 m), SpO2 99 %. Body mass index is 18.89 kg/m.  Treatment Plan Summary: Daily contact with patient to assess and evaluate symptoms and progress in treatment and Medication management PLAN Safety and Monitoring: Involuntary admission to inpatient psychiatric unit for safety, stabilization and treatment Daily contact with patient to assess and evaluate symptoms and progress in treatment Patient's case to be discussed in multi-disciplinary team meeting Observation Level : q15 minute checks Vital signs: q12 hours Precautions:Safety  MDD, Recurrent, Severe, w/Psychosis: -Continue Remeron 30 mg QHS for depression -Continue Haldol 5 mg BID for psychosis -Previously discontinued Zyprexa to 15 mg d/t lack of efficacy -Continue Xanax 0.5 mg BID PRN for anxiety -Continue Agitation Protocol: Zyprexa/Ativan/Geodon  HTN: -Continue Amlodipine to 10 mg daily -Decrease Clonidine to 0.1 mg daily instead of BID -Start Irbesartan 37.5 mg daily  GERD -Continue Famotidine 20 mg daily  Constipation -Continue Colace 100 mg daily  Folate Deficiency: -Continue Folic Acid 1 mg daily  Insomnia -Increase Trazodone to 75 mg nightly  Hypokalemia Resolved: -One time dose Kdur 40 mEq -K now 4.1 from 10/17  -Continue Ensure 237 mg BID -Continue PRN's: Tylenol, Maalox, Atarax, Milk of Magnesia  Labs: 10/13- CMP: WNL except for K: 3.0,  BUN < 5,  Total Protein: 6.4,  CBC: WNL except WBC:  11.8,  RBC: 5.27,  Hem: 16.7,  MCHC: 36.5,  Neut Abs: 8.4, Mono Abs: 1.1, Lipid Panel: WNL except Trig: 193,  A1c: 4.8, TSH: 1.902,  UDS: Benzo pos,  EtOH: Neg,  EKG: Sinus Tach with Qtc: 439 10/14- UA: WNL with exception of straw color  10/15-  RPR: Neg,  Folate: 5.2,  Ceruloplasmin: 15.6,  HIV nonreactive  Starleen Blueoris  Jaleah Lefevre, NP 04/19/2022, 2:55 PMPatient ID: Vikki PortsPamela A Muzyka, female   DOB: 02-08-1966, 56 y.o. Patient ID: Vikki PortsPamela A Mansel, female

## 2022-04-19 NOTE — [facility] Group Notes (Signed)
Pt. Did not attend  [place] group.

## 2022-04-20 DIAGNOSIS — F323 Major depressive disorder, single episode, severe with psychotic features: Secondary | ICD-10-CM | POA: Diagnosis not present

## 2022-04-20 MED ORDER — MIRTAZAPINE 15 MG PO TABS
45.0000 mg | ORAL_TABLET | Freq: Every day | ORAL | Status: DC
  Administered 2022-04-20 – 2022-04-21 (×2): 45 mg via ORAL
  Filled 2022-04-20 (×4): qty 3

## 2022-04-20 NOTE — Progress Notes (Signed)
Shanera rates sleep as "Okay". Per nightshift RN, Pt slept through the night. Pt denies SI/HI/AVH. Pt was flat/anxious on approach. Pt requested PRN Vistaril. BP has been elevated. Will recheck BP. No additional concerns. Pt remains safe.

## 2022-04-20 NOTE — Group Note (Signed)
LCSW Group Therapy Note  04/20/2022      Type of Therapy and Topic:  Group Therapy: Gratitude  Participation Level:  Minimal   Description of Group:   In this group, patients shared and discussed the importance of acknowledging the elements in their lives for which they are grateful and how this can positively impact their mood.  The group discussed how bringing the positive elements of their lives to the forefront of their minds can help with recovery from any illness, physical or mental.  An exercise was done as a group in which a list was made of gratitude items in order to encourage participants to consider other potential positives in their lives.  Therapeutic Goals: Patients will identify one or more item for which they are grateful in each of 6 categories:  people, experiences, things, places, skills, and other. Patients will discuss how it is possible to seek out gratitude in even bad situations. Patients will explore other possible items of gratitude that they could remember.   Summary of Patient Progress:  The patient shared that she is grateful for her family.  Patient's reaction to the group was to feel the same afterward.  She did not really participate in the discussion unless called on directly.  Therapeutic Modalities:   Solution-Focused Therapy Activity  Berlin Hun Grossman-Orr, LCSW .

## 2022-04-20 NOTE — [facility] Group Notes (Signed)
Pt. Did not attend group. 

## 2022-04-20 NOTE — Progress Notes (Signed)
   04/20/22 2230  Psych Admission Type (Psych Patients Only)  Admission Status Involuntary  Psychosocial Assessment  Patient Complaints Anxiety;Worrying  Eye Contact Brief  Facial Expression Flat  Affect Appropriate to circumstance  Speech Logical/coherent;Slow  Interaction Isolative  Motor Activity Slow  Appearance/Hygiene Unremarkable  Behavior Characteristics Appropriate to situation;Anxious;Restless  Mood Anxious  Thought Process  Coherency WDL  Content Preoccupation  Delusions None reported or observed  Perception WDL  Hallucination None reported or observed  Judgment Poor  Confusion None  Danger to Self  Current suicidal ideation? Denies  Danger to Others  Danger to Others None reported or observed

## 2022-04-20 NOTE — Progress Notes (Signed)
[facility] MD Progress Note  04/20/2022 2:40 PM Marie Diaz  MRN:  [id number]  HPI:  Marie Diaz is a 56 yr old female who presented to [facility] on 10/13 due to depression, SI, and command auditory hallucinations, she was admitted to [facility] on 10/14.  PPHx is significant for Anxiety and no history of Suicide Attempts, Self Injurious Behavior, or Psychiatric Hospitalizations.   24 hr chart review: BP and HR have remained elevated over the past 24 hours.  BP earlier today morning was 154/99, HR was 114.  Patient has remains compliant with her scheduled medications, and only as needed medication given last night was hydroxyzine for anxiety.  Patient has remained isolative to her room over the past 24 hours despite positive reinforcements being given for patient to attend unit group activities.  Patient has not attended any unit group sessions in the past 24 hours.  Today's nursing assessment note: During today's encounter, patient presents with a depressed mood, appears disheveled, and is ill-looking, but denies SI, denies HI, and denies auditory or visual hallucinations.  Patient also denies paranoia, and denies any first rank symptoms.  When asked to rate her depression on a scale from 0-10, with 10 being the worst, patient stated that her depression was an 8.  Patient asked to use the same scale to rate anxiety, and rated anxiety as 8 as well.  Patient seems to be under reporting her depressive symptoms, as she has demanded to be discharged multiple times and is focused on discharge.  Patient reports a good sleep quality last night, and reports a good appetite, she denies being in any physical distress or pain today and denies any issues with her bowels.  She reports that she had a bowel movement yesterday.  Positive reinforcements have been given for patient to get out of her room often to attend unit activities.  She has verbalized understanding.  Today, we are increasing Remeron to 45 mg nightly for  management of depressive symptoms, appetite stimulation, and insomnia.  Patient denies any current side effects to medications and is tolerating taking the Haldol 5 mg twice daily. No TD/EPS type symptoms found on assessment, and pt denies any feelings of stiffness. AIMS: 0.  We are continuing medications as listed below, and awaiting OT to complete a MOCA testing tomorrow to ascertain if patient has some memory loss.  Consult placed.  Principal Problem: Major depressive disorder, single episode with psychotic features ([place]) Diagnosis: Principal Problem:   Major depressive disorder, single episode with psychotic features ([place])  Total Time spent with patient:  I personally spent 30 minutes on the unit in direct patient care. The direct patient care time included face-to-face time with the patient, reviewing the patient's chart, communicating with other professionals, and coordinating care. Greater than 50% of this time was spent in counseling or coordinating care with the patient regarding goals of hospitalization, psycho-education, and discharge planning needs.  Past Psychiatric History: Anxiety and no history of Suicide Attempts, Self Injurious Behavior, or Psychiatric Hospitalizations.   Past Medical History:  Past Medical History:  Diagnosis Date   Anxiety    Depression    Gastric ulcer    Gastritis    Hypertension     Past Surgical History:  Procedure Laterality Date   ABDOMINAL HYSTERECTOMY     BIOPSY  08/29/2019   Procedure: BIOPSY;  Surgeon: Danie Binder, MD;  [facility]: [facility];  Service: Endoscopy;;   BIOPSY  02/26/2021   Procedure: BIOPSY;  Surgeon: Eloise Harman, DO;  Location: [facility];  Service: Endoscopy;;   CHOLECYSTECTOMY     ESOPHAGOGASTRODUODENOSCOPY N/A 08/29/2019   normal esophagus, localized moderate inflammation with adherent blood, congestion, erosions, and friability on greater curvature of stomach. Biopsy with chronic gastritis, negative  H.pylori.    ESOPHAGOGASTRODUODENOSCOPY (EGD) WITH PROPOFOL N/A 02/26/2021   gastritis and pathology with reactive gastropathy and negative H.pylori   HEMORRHOID SURGERY     Family History:  Family History  Problem Relation Age of Onset   Colon cancer Neg Hx    Colon polyps Neg Hx    Family Psychiatric  History: Reports None Social History:  Social History   Substance and Sexual Activity  Alcohol Use No     Social History   Substance and Sexual Activity  Drug Use No    Social History   Socioeconomic History   Marital status: Married    Spouse name: Not on file   Number of children: Not on file   Years of education: Not on file   Highest education level: Not on file  Occupational History   Not on file  Tobacco Use   Smoking status: Every Day    Packs/day: 0.50    Types: Cigarettes   Smokeless tobacco: Never  Vaping Use   Vaping Use: Former  Substance and Sexual Activity   Alcohol use: No   Drug use: No   Sexual activity: Yes  Other Topics Concern   Not on file  Social History Narrative   [phone]), kids(2). 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 SHE'S AN ONLY CHILD.   Social Determinants of Health   Financial Resource Strain: Not on file  Food Insecurity: Not on file  Transportation Needs: Not on file  Physical Activity: Not on file  Stress: Not on file  Social Connections: Not on file   Additional Social History:     Sleep: Poor  Appetite:  Fair  Current Medications: Current Facility-Administered Medications  Medication Dose Route Frequency Provider Last Rate Last Admin   acetaminophen (TYLENOL) tablet 650 mg  650 mg Oral Q6H PRN Tharon Aquas, NP       ALPRAZolam Duanne Moron) tablet 0.5 mg  0.5 mg Oral BID PRN Larita Fife, MD   0.5 mg at 04/16/22 2019   alum & mag hydroxide-simeth (MAALOX/MYLANTA) 200-200-20 MG/5ML suspension 30 mL  30 mL Oral Q4H PRN Tharon Aquas, NP       amLODipine  (NORVASC) tablet 10 mg  10 mg Oral Daily Nicholes Rough, NP   10 mg at 04/20/22 0736   benztropine (COGENTIN) tablet 0.5 mg  0.5 mg Oral BID PRN Leevy-Johnson, Brooke A, NP   0.5 mg at 04/18/22 2029   cloNIDine (CATAPRES) tablet 0.1 mg  0.1 mg Oral Daily Tonetta Napoles, Tamela Oddi, NP   0.1 mg at 04/20/22 0736   docusate sodium (COLACE) capsule 100 mg  100 mg Oral Daily Cleotha Tsang, Tamela Oddi, NP   100 mg at 04/20/22 0736   famotidine (PEPCID) tablet 20 mg  20 mg Oral Daily Verlin Duke, NP   20 mg at 04/20/22 0736   feeding supplement (ENSURE ENLIVE / ENSURE PLUS) liquid 237 mL  237 mL Oral BID BM Tharon Aquas, NP   237 mL at [id number]   folic acid (FOLVITE) tablet 1 mg  1 mg Oral Daily Larita Fife, MD   1 mg at 04/20/22 0736   haloperidol (HALDOL) tablet  5 mg  5 mg Oral BID Leevy-Johnson, Brooke A, NP   5 mg at 04/20/22 0736   hydrOXYzine (ATARAX) tablet 25 mg  25 mg Oral TID PRN Nicholes Rough, NP   25 mg at 04/20/22 0737   irbesartan (AVAPRO) tablet 37.5 mg  37.5 mg Oral Daily Nicholes Rough, NP   37.5 mg at 04/20/22 0736   magnesium hydroxide (MILK OF MAGNESIA) suspension 30 mL  30 mL Oral Daily PRN Tharon Aquas, NP       mirtazapine (REMERON) tablet 45 mg  45 mg Oral QHS Chandan Fly, NP       OLANZapine zydis (ZYPREXA) disintegrating tablet 5 mg  5 mg Oral Q8H PRN Tharon Aquas, NP   5 mg at 04/16/22 2351   traZODone (DESYREL) tablet 75 mg  75 mg Oral QHS Nicholes Rough, NP   75 mg at 04/19/22 2135    Lab Results:  No results found for this or any previous visit (from the past 13 hour(s)).   Blood Alcohol level:  Lab Results  Component Value Date   ETH <10 04/11/2022   ETH <10 38/75/6433    Metabolic Disorder Labs: Lab Results  Component Value Date   HGBA1C 4.8 04/11/2022   MPG 91.06 04/11/2022   No results found for: "PROLACTIN" Lab Results  Component Value Date   CHOL 177 04/11/2022   TRIG 193 (H) 04/11/2022   HDL 41 04/11/2022   CHOLHDL 4.3 04/11/2022   VLDL  39 04/11/2022   LDLCALC 97 04/11/2022    Physical Findings: AIMS: 0 CIWA:  n/a COWS: n/a    Musculoskeletal: Strength & Muscle Tone: decreased Gait & Station: normal Patient leans: N/A  Psychiatric Specialty Exam:  Presentation  General Appearance:  Appropriate for Environment; Disheveled  Eye Contact: Fair  Speech: Clear and Coherent  Speech Volume: Normal  Handedness:Right   Mood and Affect  Mood: Depressed  Affect: Congruent   Thought Process  Thought Processes: Coherent  Descriptions of Associations:Intact  Orientation:Full (Time, Place and Person)  Thought Content:Logical  History of Schizophrenia/Schizoaffective disorder:No  Duration of Psychotic Symptoms:Less than six months  Hallucinations:Hallucinations: None  Ideas of Reference:None  Suicidal Thoughts:Suicidal Thoughts: No SI Active Intent and/or Plan: Without Intent; Without Plan  Homicidal Thoughts:Homicidal Thoughts: No   Sensorium  Memory: Immediate Good  Judgment: Poor  Insight: Poor   Executive Functions  Concentration: Fair  Attention Span: Fair  Recall: [place] of Knowledge: Fair  Language: Fair   Psychomotor Activity  Psychomotor Activity:Psychomotor Activity: Normal   Assets  Assets: Communication Skills   Sleep  Sleep:Sleep: Good   Physical Exam: Physical Exam Vitals and nursing note reviewed.  Constitutional:      General: She is not in acute distress.    Appearance: Normal appearance. She is normal weight. She is ill-appearing. She is not toxic-appearing.  HENT:     Head: Normocephalic and atraumatic.  Pulmonary:     Effort: Pulmonary effort is normal.  Musculoskeletal:        General: Normal range of motion.  Neurological:     General: No focal deficit present.     Mental Status: She is alert and oriented to person, place, and time.    Review of Systems  Constitutional: Negative.   HENT: Negative.    Eyes: Negative.    Respiratory:  Negative for cough and shortness of breath.   Cardiovascular:  Negative for chest pain.  Gastrointestinal:  Negative for abdominal pain, constipation, diarrhea, nausea  and vomiting.  Skin: Negative.   Neurological:  Positive for weakness (mild). Negative for dizziness and headaches (mild).  Psychiatric/Behavioral:  Positive for depression. Negative for hallucinations, substance abuse and suicidal ideas (passive SI). The patient is nervous/anxious and has insomnia.    Blood pressure 113/64, pulse 89, temperature 98.3 F (36.8 C), temperature source Oral, resp. rate 18, height 5\' 1"  (1.549 m), SpO2 98 %. Body mass index is 18.89 kg/m.  Treatment Plan Summary: Daily contact with patient to assess and evaluate symptoms and progress in treatment and Medication management PLAN Safety and Monitoring: Involuntary admission to inpatient psychiatric unit for safety, stabilization and treatment Daily contact with patient to assess and evaluate symptoms and progress in treatment Patient's case to be discussed in multi-disciplinary team meeting Observation Level : q15 minute checks Vital signs: q12 hours Precautions:Safety  MDD, Recurrent, Severe, w/Psychosis: -Increase Remeron 45 mg QHS for depression, appetite and insomnia -Continue Haldol 5 mg BID for psychosis -Previously discontinued Zyprexa to 15 mg d/t lack of efficacy -Continue Xanax 0.5 mg BID PRN for anxiety -Continue Agitation Protocol: Zyprexa/Ativan/Geodon  HTN: -Continue Amlodipine to 10 mg daily -Decrease Clonidine to 0.1 mg daily instead of BID -Continue Irbesartan 37.5 mg daily  GERD -Continue Famotidine 20 mg daily  Constipation -Continue Colace 100 mg daily  Folate Deficiency: -Continue Folic Acid 1 mg daily  Insomnia -Continue Trazodone to 75 mg nightly  Hypokalemia Resolved: -One time dose Kdur 40 mEq -K now 4.1 from 10/17  -Continue Ensure 237 mg BID -Continue PRN's: Tylenol, Maalox, Atarax,  Milk of Magnesia  Labs: 10/13- CMP: WNL except for K: 3.0,  BUN < 5,  Total Protein: 6.4,  CBC: WNL except WBC: 11.8,  RBC: 5.27,  Hem: 16.7,  MCHC: 36.5,  Neut Abs: 8.4, Mono Abs: 1.1, Lipid Panel: WNL except Trig: 193,  A1c: 4.8, TSH: 1.902,  UDS: Benzo pos,  EtOH: Neg,  EKG: Sinus Tach with Qtc: 439 10/14- UA: WNL with exception of straw color  10/15-  RPR: Neg,  Folate: 5.2,  Ceruloplasmin: 15.6,  HIV nonreactive  Nicholes Rough, NP 04/20/2022, 2:40 PMPatient ID: Marie Diaz, female   DOB: 14-Feb-1966, 56 y.o. Patient ID: Marie Diaz, female  Patient ID: Marie Diaz, female   DOB: [birth year], 56 y.o.   MRN: [id number]

## 2022-04-20 NOTE — Progress Notes (Signed)
Adult Psychoeducational Group Note  Date:  04/20/2022 Time:  8:15 PM  Group Topic/Focus:  Wrap-Up Group:   The focus of this group is to help patients review their daily goal of treatment and discuss progress on daily workbooks.  Participation Level:  Did Not Attend  Participation Quality:  Did Not Attend  Affect:  Did Not Attend  Cognitive:  Did Not Attend  Insight: Did Not Attend  Engagement in Group:  Did Not Attend  Modes of Intervention:  Did Not Attend  Additional Comments:   Pt was encouraged to attend wrap up group but refused.    Gerhard Perches 04/20/2022, 8:15 PM

## 2022-04-21 ENCOUNTER — Encounter (HOSPITAL_COMMUNITY): Payer: Self-pay

## 2022-04-21 DIAGNOSIS — F323 Major depressive disorder, single episode, severe with psychotic features: Secondary | ICD-10-CM | POA: Diagnosis not present

## 2022-04-21 NOTE — Progress Notes (Signed)
Adult Psychoeducational Group Note  Date:  04/21/2022 Time:  8:05 PM  Group Topic/Focus:  Wrap-Up Group:   The focus of this group is to help patients review their daily goal of treatment and discuss progress on daily workbooks.  Participation Level:  Did Not Attend  Participation Quality:  Did Not Attend  Affect:  Did Not Attend  Cognitive:  Did Not Attend  Insight: Did Not Attend  Engagement in Group:  Did Not Attend  Modes of Intervention:  Did Not Attend  Additional Comments:   Pt was encouraged to attend wrap up group but refused   Gerhard Perches 04/21/2022, 8:05 PM

## 2022-04-21 NOTE — BH IP Treatment Plan (Signed)
Interdisciplinary Treatment and Diagnostic Plan Update  04/21/2022 Time of Session: 0830 CRISSA SOWDER MRN: [id number]  Principal Diagnosis: Major depressive disorder, single episode with psychotic features [facility])  Secondary Diagnoses: Principal Problem:   Major depressive disorder, single episode with psychotic features ([place])   Current Medications:  Current Facility-Administered Medications  Medication Dose Route Frequency Provider Last Rate Last Admin   acetaminophen (TYLENOL) tablet 650 mg  650 mg Oral Q6H PRN Tharon Aquas, NP       ALPRAZolam Duanne Moron) tablet 0.5 mg  0.5 mg Oral BID PRN Larita Fife, MD   0.5 mg at 04/21/22 0711   alum & mag hydroxide-simeth (MAALOX/MYLANTA) 200-200-20 MG/5ML suspension 30 mL  30 mL Oral Q4H PRN Tharon Aquas, NP       amLODipine (NORVASC) tablet 10 mg  10 mg Oral Daily Nicholes Rough, NP   10 mg at 04/21/22 0742   benztropine (COGENTIN) tablet 0.5 mg  0.5 mg Oral BID PRN Leevy-Johnson, Brooke A, NP   0.5 mg at 04/18/22 2029   docusate sodium (COLACE) capsule 100 mg  100 mg Oral Daily Nicholes Rough, NP   100 mg at 04/21/22 0743   famotidine (PEPCID) tablet 20 mg  20 mg Oral Daily Nkwenti, Tamela Oddi, NP   20 mg at 04/21/22 0742   feeding supplement (ENSURE ENLIVE / ENSURE PLUS) liquid 237 mL  237 mL Oral BID BM Tharon Aquas, NP   237 mL at 42/35/36 1443   folic acid (FOLVITE) tablet 1 mg  1 mg Oral Daily Paliy, Delrae Rend, MD   1 mg at 04/21/22 0743   haloperidol (HALDOL) tablet 5 mg  5 mg Oral BID Leevy-Johnson, Brooke A, NP   5 mg at 04/21/22 0743   hydrOXYzine (ATARAX) tablet 25 mg  25 mg Oral TID PRN Nicholes Rough, NP   25 mg at 04/20/22 0737   irbesartan (AVAPRO) tablet 37.5 mg  37.5 mg Oral Daily Nkwenti, Tamela Oddi, NP   37.5 mg at 04/21/22 0741   magnesium hydroxide (MILK OF MAGNESIA) suspension 30 mL  30 mL Oral Daily PRN Tharon Aquas, NP       mirtazapine (REMERON) tablet 45 mg  45 mg Oral QHS Nkwenti, Doris, NP   45 mg at  04/20/22 2056   OLANZapine zydis (ZYPREXA) disintegrating tablet 5 mg  5 mg Oral Q8H PRN Tharon Aquas, NP   5 mg at 04/16/22 2351   traZODone (DESYREL) tablet 75 mg  75 mg Oral QHS Nicholes Rough, NP   75 mg at 04/20/22 2056   PTA Medications: Medications Prior to Admission  Medication Sig Dispense Refill Last Dose   ALPRAZolam (XANAX) 1 MG tablet Take 1 mg by mouth 3 (three) times daily.      cloNIDine (CATAPRES) 0.2 MG tablet Take 0.5 tablets (0.1 mg total) by mouth 2 (two) times daily. (Patient taking differently: Take 0.2 mg by mouth 2 (two) times daily.)       Patient Stressors: Loss of husband    Patient Strengths: Average or above average intelligence   Treatment Modalities: Medication Management, Group therapy, Case management,  1 to 1 session with clinician, Psychoeducation, Recreational therapy.   Physician Treatment Plan for Primary Diagnosis: Major depressive disorder, single episode with psychotic features ([place]) Long Term Goal(s): Improvement in symptoms so as ready for discharge   Short Term Goals: Ability to identify changes in lifestyle to reduce recurrence of condition will improve Ability to verbalize feelings will improve Ability to disclose  and discuss suicidal ideas Ability to demonstrate self-control will improve Ability to identify and develop effective coping behaviors will improve Ability to maintain clinical measurements within normal limits will improve Compliance with prescribed medications will improve Ability to identify triggers associated with substance abuse/mental health issues will improve  Medication Management: Evaluate patient's response, side effects, and tolerance of medication regimen.  Therapeutic Interventions: 1 to 1 sessions, Unit Group sessions and Medication administration.  Evaluation of Outcomes: Progressing  Physician Treatment Plan for Secondary Diagnosis: Principal Problem:   Major depressive disorder, single episode  with psychotic features ([facility])  Long Term Goal(s): Improvement in symptoms so as ready for discharge   Short Term Goals: Ability to identify changes in lifestyle to reduce recurrence of condition will improve Ability to verbalize feelings will improve Ability to disclose and discuss suicidal ideas Ability to demonstrate self-control will improve Ability to identify and develop effective coping behaviors will improve Ability to maintain clinical measurements within normal limits will improve Compliance with prescribed medications will improve Ability to identify triggers associated with substance abuse/mental health issues will improve     Medication Management: Evaluate patient's response, side effects, and tolerance of medication regimen.  Therapeutic Interventions: 1 to 1 sessions, Unit Group sessions and Medication administration.  Evaluation of Outcomes: Progressing   RN Treatment Plan for Primary Diagnosis: Major depressive disorder, single episode with psychotic features ([facility]) Long Term Goal(s): Knowledge of disease and therapeutic regimen to maintain health will improve  Short Term Goals: Ability to remain free from injury will improve, Ability to verbalize frustration and anger appropriately will improve, Ability to demonstrate self-control, Ability to participate in decision making will improve, Ability to verbalize feelings will improve, Ability to disclose and discuss suicidal ideas, Ability to identify and develop effective coping behaviors will improve, and Compliance with prescribed medications will improve  Medication Management: RN will administer medications as ordered by provider, will assess and evaluate patient's response and provide education to patient for prescribed medication. RN will report any adverse and/or side effects to prescribing provider.  Therapeutic Interventions: 1 on 1 counseling sessions, Psychoeducation, Medication administration, Evaluate responses to  treatment, Monitor vital signs and CBGs as ordered, Perform/monitor CIWA, COWS, AIMS and Fall Risk screenings as ordered, Perform wound care treatments as ordered.  Evaluation of Outcomes: Progressing   LCSW Treatment Plan for Primary Diagnosis: Major depressive disorder, single episode with psychotic features ([facility]) Long Term Goal(s): Safe transition to appropriate next level of care at discharge, Engage patient in therapeutic group addressing interpersonal concerns.  Short Term Goals: Engage patient in aftercare planning with referrals and resources, Increase social support, Increase ability to appropriately verbalize feelings, Increase emotional regulation, Facilitate acceptance of mental health diagnosis and concerns, Facilitate patient progression through stages of change regarding substance use diagnoses and concerns, Identify triggers associated with mental health/substance abuse issues, and Increase skills for wellness and recovery  Therapeutic Interventions: Assess for all discharge needs, 1 to 1 time with Social worker, Explore available resources and support systems, Assess for adequacy in community support network, Educate family and significant other(s) on suicide prevention, Complete Psychosocial Assessment, Interpersonal group therapy.  Evaluation of Outcomes: Progressing   Progress in Treatment: Attending groups: No. Participating in groups: No. Taking medication as prescribed: Yes. Toleration medication: Yes. Family/Significant other contact made: Yes, individual(s) contacted:  Adonis Brook,  [phone] Patient understands diagnosis: No. Discussing patient identified problems/goals with staff: Yes. Medical problems stabilized or resolved: Yes. Denies suicidal/homicidal ideation: No. Issues/concerns per patient self-inventory: Yes. Other:  none  New problem(s) identified: No, Describe:  none  New Short Term/Long Term Goal(s): Patient to work towards elimination of  symptoms of psychosis, medication management for mood stabilization; elimination of SI thoughts; development of comprehensive mental wellness plan.  Patient Goals:  No additional goals identified at this time. Patient to continue to work towards original goals identified in initial treatment team meeting. CSW will remain available to patient should they voice additional treatment goals.   Discharge Plan or Barriers: No psychosocial barriers identified at this time, patient to return to place of residence when appropriate for discharge.   Reason for Continuation of Hospitalization: Medication stabilization Other; describe psychosis   Estimated Length of Stay: 1-7 days   Last 3 [place] Suicide Severity Risk Score: Flowsheet Row [facility] (Current) [facility] Most recent reading at 04/11/2022  6:00 PM ED from 04/11/2022 in [facility] Most recent reading at 04/11/2022 11:51 AM ED from 03/16/2022 in [facility] EMERGENCY DEPARTMENT Most recent reading at 03/16/2022 12:55 AM  C-SSRS RISK CATEGORY High Risk High Risk No Risk       Scribe for Treatment Team: Almedia Balls 04/21/2022 1:08 PM

## 2022-04-21 NOTE — Group Note (Signed)
ype of Therapy and Topic:  Group Therapy:  Cycle of Depression   Participation Level:  Did not attend   Description of Group:  Patients in this group were introduced to the idea of the cycle of depression. Patients explored how stressors can trigger thoughts, feels and physical symptoms that can make you behave in ways that increase symptoms of depression.  Patient identified specific stressors that have triggered depression and explored thoughts that they have about themselves that may not always be true.   Patients encouraged to come up with positive changes and interventions put in place to stop cycle of depression. Patients also participated in discussion about benefits of being able to identify stressors, thoughts, feels and behavioral responses when depressed.      Therapeutic Goals:               1)  To discuss the positive and negative impacts of depressive feels             2)  identify signs and symptoms of depression             3)  discuss alternative behaviors to stop cycle of depression             4)  offer mutual support to others regarding depression             5)  Developing plans for ways to manage specific stressors upon discharge               Summary of Patient Progress:  Did not attend   Therapeutic Modalities:   Motivational Interviewing Brief Solution-Focused Therapy    Ranferi Clingan, LCSW, LCAS Clincal Social Worker  [facility]  

## 2022-04-21 NOTE — Evaluation (Signed)
Occupational Therapy Evaluation Patient Details Name: Marie Diaz MRN: [id number] DOB: [birth year] Today's Date: 04/21/2022   History of Present Illness MDD   Clinical Impression   Pt is found in side lying upon OT entry. OT was consulted to perform neurocognitive testing due to concerns related to processing and memory deficits. OT administered the MoCA v 8.2 where the pt was able to achieve a total score of 22/30 which is suggestive of MCI.   Pt performed exceptionally well in TMT B w/o error in shifting / pt was able to complete clock drawing w/ acceptable contour and closure, digit expression w/o error and time setting to 10 past 11 w/o error. One point was lost in serial seven subtraction, one point was lost during language fluency where the pt was able to generate 9 words that begin w/ "s" where the min required is 11 or more, she lost one point d/t the incorrect date and five points during delayed recall, however, w/ semantic cuing pt was able to recall 5/5 for a MIS (memory index score) of 7/15. A total score of 22/30 is suggestive of MCI.   During the physical and ADL assessment pt does present w/ global tremors that appear to be more pronounced on the right side, however pt states the tremors do not interfere w/ her daily function. These findings were discussed w/ NP s/p OT session. There are no further acute OT needs at this time and OT will sign off. Thank you for this consult.     Recommendations for follow up therapy are one component of a multi-disciplinary discharge planning process, led by the attending physician.  Recommendations may be updated based on patient status, additional functional criteria and insurance authorization.   Follow Up Recommendations  No OT follow up    Assistance Recommended at Discharge None  Patient can return home with the following   Pt's son lives w/ her and is able to assist as needed per pt report.     Functional Status Assessment   Patient has not had a recent decline in their functional status  Equipment Recommendations  None recommended by OT    Recommendations for Other Services  none     Precautions / Restrictions Precautions Precautions: None Restrictions Weight Bearing Restrictions: No      Mobility Bed Mobility Overal bed mobility: Independent                  Transfers Overall transfer level: Independent Equipment used: None                      Balance Overall balance assessment: Independent                                         ADL either performed or assessed with clinical judgement   ADL Overall ADL's : Independent                                             Vision Baseline Vision/History: 0 No visual deficits       Perception     Praxis      Pertinent Vitals/Pain Pain Assessment Pain Assessment: No/denies pain     Hand Dominance Right   Extremity/Trunk Assessment Upper Extremity Assessment Upper  Extremity Assessment: Overall WFL for tasks assessed   Lower Extremity Assessment Lower Extremity Assessment: Overall WFL for tasks assessed   Cervical / Trunk Assessment Cervical / Trunk Assessment: Normal   Communication Communication Communication: No difficulties   Cognition Arousal/Alertness: Awake/alert Behavior During Therapy: WFL for tasks assessed/performed Overall Cognitive Status: Within Functional Limits for tasks assessed                                 General Comments: MoCA 8.2: 22/30 with a Memory Index Score of 7/15; 22/30 is suggestive of MCI of global cognitive function     General Comments       Exercises     Shoulder Instructions      Home Living Family/patient expects to be discharged to:: Private residence Living Arrangements: Children Available Help at Discharge: Family Type of Home: [place]: One level               Home Equipment: None           Prior Functioning/Environment Prior Level of Function : Independent/Modified Independent             Mobility Comments: independent w/o imbalance or Hx of falls per pt report ADLs Comments: independent        OT Problem List: Decreased cognition      OT Treatment/Interventions:      OT Goals(Current goals can be found in the care plan section)    OT Frequency:      Co-evaluation              AM-PAC OT "6 Clicks" Daily Activity     Outcome Measure Help from another person eating meals?: None Help from another person taking care of personal grooming?: None Help from another person toileting, which includes using toliet, bedpan, or urinal?: None Help from another person bathing (including washing, rinsing, drying)?: None Help from another person to put on and taking off regular upper body clothing?: None Help from another person to put on and taking off regular lower body clothing?: None 6 Click Score: 24   End of Session Nurse Communication: Other (comment) (MoCA score interpretation and implications / DC recommendations)  Activity Tolerance: Patient tolerated treatment well Patient left: in bed  OT Visit Diagnosis: Cognitive communication deficit (R41.841)                Time: 6295-2841 OT Time Calculation (min): 39 min Charges:  OT General Charges $OT Visit: 1 Visit OT Evaluation $OT Eval Moderate Complexity: 1 Mod OT Treatments $Self Care/Home Management : 8-22 mins $Cognitive Funtion inital: Initial 15 mins $Cognitive Funtion additional: Additional15 mins  Cornell Barman, OT   Brantley Stage 04/21/2022, 6:29 PM

## 2022-04-21 NOTE — Progress Notes (Signed)
Pt is A&OX4, anxious, denies suicidal ideations, denies homicidal ideations, denies auditory hallucinations and denies visual hallucinations. Pt verbally agrees to approach staff if these become apparent and before harming self or others. Pt denies experiencing nightmares. Mood and affect are congruent. Pt appetite is ok. No complaints of pain and/or discomfort at this time. Pt's memory appears to be grossly intact, and Pt hasn't displayed any injurious behaviors. Pt is medication compliant. There's no evidence of suicidal intent.

## 2022-04-21 NOTE — Progress Notes (Signed)
[facility] MD Progress Note  04/21/2022 3:33 PM Marie Diaz  MRN:  [id number]  HPI:  Marie Diaz is a 56 yr old female who presented to [facility] on 10/13 due to depression, SI, and command auditory hallucinations, she was admitted to [facility] on 10/14.  PPHx is significant for Anxiety and no history of Suicide Attempts, Self Injurious Behavior, or Psychiatric Hospitalizations.   24 hr chart review: BP and HR have continued to be elevated with BP earlier today morning being 126/107, and HR 115. Nursing asked to recheck vitals, but BP has consistently stayed elevated over the past couple of days.  Patient has remained compliant with her scheduled medications, and only as needed medication given last night was hydroxyzine 25 mg for anxiety.  Patient has continued to remain isolative to her room over the past 24 hours despite positive reinforcements being given for patient to attend unit group activities.  Patient has not attended any unit group sessions in the past 24 hours.  Today's nursing assessment note: On assessment today, pt presents with flat affect and depressed mood, mild tremors noted to b/l fingers and arms, but most likely related to anxiety. Her attention to personal hygiene and grooming is poor, pt appears disheveled, states that she has not had a shower in a few days. Eye contact is good, speech is clear & coherent. Thought contents are organized and logical, and pt currently denies SI/HI/AVH or paranoia. There is no evidence of delusional thoughts.    Remeron increased last night to 45 mg nightly for management of depressive symptoms, appetite stimulation, and insomnia.  Patient denies any current side effects to medications and is tolerating taking the Haldol 5 mg twice daily. AIMS: 2. She reports a fair appetite and a fair sleep quality. Pt appears emaciated and ill looking. Will order Ensure nutritional supplement in between meals and place order for staff to keep food log during meals. We are  continuing medications as listed below.  MOCA testing completed by OT and pt deemed to have mild cognitive impairment. Please see notes from OT. Principal Problem: Major depressive disorder, single episode with psychotic features ([place]) Diagnosis: Principal Problem:   Major depressive disorder, single episode with psychotic features ([place])  Total Time spent with patient:  I personally spent 30 minutes on the unit in direct patient care. The direct patient care time included face-to-face time with the patient, reviewing the patient's chart, communicating with other professionals, and coordinating care. Greater than 50% of this time was spent in counseling or coordinating care with the patient regarding goals of hospitalization, psycho-education, and discharge planning needs.  Past Psychiatric History: Anxiety and no history of Suicide Attempts, Self Injurious Behavior, or Psychiatric Hospitalizations.   Past Medical History:  Past Medical History:  Diagnosis Date   Anxiety    Depression    Gastric ulcer    Gastritis    Hypertension     Past Surgical History:  Procedure Laterality Date   ABDOMINAL HYSTERECTOMY     BIOPSY  08/29/2019   Procedure: BIOPSY;  Surgeon: Danie Binder, MD;  [facility];  Service: Endoscopy;;   BIOPSY  02/26/2021   Procedure: BIOPSY;  Surgeon: Eloise Harman, DO;  Location: [facility];  Service: Endoscopy;;   CHOLECYSTECTOMY     ESOPHAGOGASTRODUODENOSCOPY N/A 08/29/2019   normal esophagus, localized moderate inflammation with adherent blood, congestion, erosions, and friability on greater curvature of stomach. Biopsy with chronic gastritis, negative H.pylori.    ESOPHAGOGASTRODUODENOSCOPY (EGD)  WITH PROPOFOL N/A 02/26/2021   gastritis and pathology with reactive gastropathy and negative H.pylori   HEMORRHOID SURGERY     Family History:  Family History  Problem Relation Age of Onset   Colon cancer Neg Hx    Colon polyps Neg Hx    Family  Psychiatric  History: Reports None Social History:  Social History   Substance and Sexual Activity  Alcohol Use No     Social History   Substance and Sexual Activity  Drug Use No    Social History   Socioeconomic History   Marital status: Married    Spouse name: Not on file   Number of children: Not on file   Years of education: Not on file   Highest education level: Not on file  Occupational History   Not on file  Tobacco Use   Smoking status: Every Day    Packs/day: 0.50    Types: Cigarettes   Smokeless tobacco: Never  Vaping Use   Vaping Use: Former  Substance and Sexual Activity   Alcohol use: No   Drug use: No   Sexual activity: Yes  Other Topics Concern   Not on file  Social History Narrative   [phone]), kids(2). 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 SHE'S AN ONLY CHILD.   Social Determinants of Health   Financial Resource Strain: Not on file  Food Insecurity: Not on file  Transportation Needs: Not on file  Physical Activity: Not on file  Stress: Not on file  Social Connections: Not on file   Additional Social History:     Sleep: Poor  Appetite:  Fair  Current Medications: Current Facility-Administered Medications  Medication Dose Route Frequency Provider Last Rate Last Admin   acetaminophen (TYLENOL) tablet 650 mg  650 mg Oral Q6H PRN Lauree Chandler, NP       ALPRAZolam Prudy Feeler) tablet 0.5 mg  0.5 mg Oral BID PRN Thalia Party, MD   0.5 mg at 04/21/22 0711   alum & mag hydroxide-simeth (MAALOX/MYLANTA) 200-200-20 MG/5ML suspension 30 mL  30 mL Oral Q4H PRN Lauree Chandler, NP       amLODipine (NORVASC) tablet 10 mg  10 mg Oral Daily Starleen Blue, NP   10 mg at 04/21/22 0742   benztropine (COGENTIN) tablet 0.5 mg  0.5 mg Oral BID PRN Leevy-Johnson, Brooke A, NP   0.5 mg at 04/18/22 2029   docusate sodium (COLACE) capsule 100 mg  100 mg Oral Daily Tripton Ned, Tyler Aas, NP   100 mg at 04/21/22  0743   famotidine (PEPCID) tablet 20 mg  20 mg Oral Daily Math Brazie, NP   20 mg at 04/21/22 0742   feeding supplement (ENSURE ENLIVE / ENSURE PLUS) liquid 237 mL  237 mL Oral BID BM Lauree Chandler, NP   237 mL at 04/21/22 1420   folic acid (FOLVITE) tablet 1 mg  1 mg Oral Daily Paliy, Serina Cowper, MD   1 mg at 04/21/22 0743   haloperidol (HALDOL) tablet 5 mg  5 mg Oral BID Leevy-Johnson, Brooke A, NP   5 mg at 04/21/22 0743   hydrOXYzine (ATARAX) tablet 25 mg  25 mg Oral TID PRN Starleen Blue, NP   25 mg at 04/20/22 0737   irbesartan (AVAPRO) tablet 37.5 mg  37.5 mg Oral Daily Threasa Kinch, NP   37.5 mg at 04/21/22 0741   magnesium hydroxide (MILK OF MAGNESIA) suspension 30 mL  30  mL Oral Daily PRN Lauree Chandler, NP       mirtazapine (REMERON) tablet 45 mg  45 mg Oral QHS Starleen Blue, NP   45 mg at 04/20/22 2056   OLANZapine zydis (ZYPREXA) disintegrating tablet 5 mg  5 mg Oral Q8H PRN Lauree Chandler, NP   5 mg at 04/16/22 2351   traZODone (DESYREL) tablet 75 mg  75 mg Oral QHS Starleen Blue, NP   75 mg at 04/20/22 2056    Lab Results:  No results found for this or any previous visit (from the past 48 hour(s)).   Blood Alcohol level:  Lab Results  Component Value Date   ETH <10 04/11/2022   ETH <10 06/11/2019    Metabolic Disorder Labs: Lab Results  Component Value Date   HGBA1C 4.8 04/11/2022   MPG 91.06 04/11/2022   No results found for: "PROLACTIN" Lab Results  Component Value Date   CHOL 177 04/11/2022   TRIG 193 (H) 04/11/2022   HDL 41 04/11/2022   CHOLHDL 4.3 04/11/2022   VLDL 39 04/11/2022   LDLCALC 97 04/11/2022    Physical Findings: AIMS: 0 CIWA:  n/a COWS: n/a    Musculoskeletal: Strength & Muscle Tone: decreased Gait & Station: normal Patient leans: N/A  Psychiatric Specialty Exam:  Presentation  General Appearance:  Disheveled  Eye Contact: Good  Speech: Clear and Coherent  Speech  Volume: Decreased  Handedness:Right   Mood and Affect  Mood: Anxious; Depressed  Affect: Congruent   Thought Process  Thought Processes: Coherent  Descriptions of Associations:Intact  Orientation:Full (Time, Place and Person)  Thought Content:Logical  History of Schizophrenia/Schizoaffective disorder:No  Duration of Psychotic Symptoms:Less than six months  Hallucinations:Hallucinations: None  Ideas of Reference:None  Suicidal Thoughts:Suicidal Thoughts: No  Homicidal Thoughts:Homicidal Thoughts: No   Sensorium  Memory: Immediate Good  Judgment: Poor  Insight: Poor   Executive Functions  Concentration: Fair  Attention Span: Fair  Recall: Fair  Fund of Knowledge: Fair  Language: Fair   Psychomotor Activity  Psychomotor Activity:Psychomotor Activity: Normal   Assets  Assets: Resilience   Sleep  Sleep:Sleep: Fair   Physical Exam: Physical Exam Vitals and nursing note reviewed.  Constitutional:      General: She is not in acute distress.    Appearance: Normal appearance. She is normal weight. She is ill-appearing. She is not toxic-appearing.  HENT:     Head: Normocephalic and atraumatic.  Pulmonary:     Effort: Pulmonary effort is normal.  Musculoskeletal:        General: Normal range of motion.  Neurological:     General: No focal deficit present.     Mental Status: She is alert and oriented to person, place, and time.    Review of Systems  Constitutional: Negative.   HENT: Negative.    Eyes: Negative.   Respiratory:  Negative for cough and shortness of breath.   Cardiovascular:  Negative for chest pain.  Gastrointestinal:  Negative for abdominal pain, constipation, diarrhea, nausea and vomiting.  Skin: Negative.   Neurological:  Positive for weakness (mild). Negative for dizziness and headaches (mild).  Psychiatric/Behavioral:  Positive for depression. Negative for hallucinations, substance abuse and suicidal ideas  (passive SI). The patient is nervous/anxious and has insomnia.    Blood pressure 121/71, pulse 78, temperature (!) 97.5 F (36.4 C), temperature source Oral, resp. rate 16, height 5\' 1"  (1.549 m), SpO2 100 %. Body mass index is 18.89 kg/m.  Treatment Plan Summary: Daily contact with patient  to assess and evaluate symptoms and progress in treatment and Medication management PLAN Safety and Monitoring: Involuntary admission to inpatient psychiatric unit for safety, stabilization and treatment Daily contact with patient to assess and evaluate symptoms and progress in treatment Patient's case to be discussed in multi-disciplinary team meeting Observation Level : q15 minute checks Vital signs: q12 hours Precautions:Safety  MDD, Recurrent, Severe, w/Psychosis: -Continue Remeron 45 mg QHS for depression, appetite and insomnia -Continue Haldol 5 mg BID for psychosis -Previously discontinued Zyprexa to 15 mg d/t lack of efficacy -Continue Xanax 0.5 mg BID PRN for anxiety -Continue Agitation Protocol: Zyprexa/Ativan/Geodon  HTN: -Continue Amlodipine to 10 mg daily -Decrease Clonidine to 0.1 mg daily instead of BID -Continue Irbesartan 37.5 mg daily  GERD -Continue Famotidine 20 mg daily  Constipation -Continue Colace 100 mg daily  Folate Deficiency: -Continue Folic Acid 1 mg daily  Insomnia -Continue Trazodone to 75 mg nightly  Hypokalemia Resolved: -One time dose Kdur 40 mEq -K now 4.1 from 10/17  -Continue Ensure 237 mg BID -Continue PRN's: Tylenol, Maalox, Atarax, Milk of Magnesia  Labs: 10/13- CMP: WNL except for K: 3.0,  BUN < 5,  Total Protein: 6.4,  CBC: WNL except WBC: 11.8,  RBC: 5.27,  Hem: 16.7,  MCHC: 36.5,  Neut Abs: 8.4, Mono Abs: 1.1, Lipid Panel: WNL except Trig: 193,  A1c: 4.8, TSH: 1.902,  UDS: Benzo pos,  EtOH: Neg,  EKG: Sinus Tach with Qtc: 439 10/14- UA: WNL with exception of straw color  10/15-  RPR: Neg,  Folate: 5.2,  Ceruloplasmin: 15.6,  HIV  nonreactive  Starleen Blue, NP 04/21/2022, 3:33 PMPatient ID: Vikki Ports, female   DOB: [birth year], 56 y.o. Patient ID: Vikki Ports, female  Patient ID: Vikki Ports, female   DOB: Jan 03, 1966, 56 y.o.   MRN: [id number] Patient ID: ALIA PARSLEY, female   DOB: [birth year], 56 y.o.   MRN: [id number]

## 2022-04-21 NOTE — BHH Group Notes (Signed)
Adult Psychoeducational Group Note  Date:  04/21/2022 Time:  9:23 AM  Group Topic/Focus:  Goals Group:   The focus of this group is to help patients establish daily goals to achieve during treatment and discuss how the patient can incorporate goal setting into their daily lives to aide in recovery.  Participation Level:  Did Not Attend    Dub Mikes 04/21/2022, 9:23 AM

## 2022-04-22 ENCOUNTER — Other Ambulatory Visit: Payer: Self-pay

## 2022-04-22 DIAGNOSIS — F323 Major depressive disorder, single episode, severe with psychotic features: Secondary | ICD-10-CM | POA: Diagnosis not present

## 2022-04-22 MED ORDER — SERTRALINE HCL 50 MG PO TABS
50.0000 mg | ORAL_TABLET | Freq: Every day | ORAL | Status: AC
  Administered 2022-04-22: 50 mg via ORAL
  Filled 2022-04-22 (×2): qty 1

## 2022-04-22 MED ORDER — METHYLPHENIDATE HCL 10 MG PO TABS
5.0000 mg | ORAL_TABLET | Freq: Every day | ORAL | Status: DC
  Administered 2022-04-23 – 2022-04-26 (×4): 5 mg via ORAL
  Filled 2022-04-22 (×4): qty 1

## 2022-04-22 MED ORDER — CLONIDINE HCL 0.1 MG PO TABS: 0.1000 mg | ORAL_TABLET | Freq: Two times a day (BID) | ORAL | Status: DC | PRN

## 2022-04-22 MED ORDER — TRAZODONE HCL 100 MG PO TABS
100.0000 mg | ORAL_TABLET | Freq: Every day | ORAL | Status: DC
  Administered 2022-04-22 – 2022-04-27 (×6): 100 mg via ORAL
  Filled 2022-04-22 (×9): qty 1

## 2022-04-22 MED ORDER — SERTRALINE HCL 50 MG PO TABS: 50.0000 mg | ORAL_TABLET | Freq: Every day | ORAL | Status: DC

## 2022-04-22 MED ORDER — DONEPEZIL HCL 5 MG PO TABS
5.0000 mg | ORAL_TABLET | Freq: Every day | ORAL | Status: DC
  Administered 2022-04-22 – 2022-04-27 (×6): 5 mg via ORAL
  Filled 2022-04-22 (×8): qty 1

## 2022-04-22 MED ORDER — IRBESARTAN 75 MG PO TABS
75.0000 mg | ORAL_TABLET | Freq: Every day | ORAL | Status: DC
  Administered 2022-04-23 – 2022-04-28 (×6): 75 mg via ORAL
  Filled 2022-04-22 (×7): qty 1

## 2022-04-22 MED ORDER — SERTRALINE HCL 100 MG PO TABS
100.0000 mg | ORAL_TABLET | Freq: Every day | ORAL | Status: DC
  Administered 2022-04-23 – 2022-04-28 (×6): 100 mg via ORAL
  Filled 2022-04-22 (×7): qty 1

## 2022-04-22 NOTE — Progress Notes (Signed)
Adult Psychoeducational Group Note  Date:  04/22/2022 Time:  8:27 PM  Group Topic/Focus:  Wrap-Up Group:   The focus of this group is to help patients review their daily goal of treatment and discuss progress on daily workbooks.  Participation Level:  Active  Participation Quality:  Appropriate and Resistant  Affect:  Flat  Cognitive:  Appropriate  Insight: Appropriate and Limited  Engagement in Group:  Limited  Modes of Intervention:  Discussion  Additional Comments:   Pt states that she had a good day. Pt was able to attend a group about medication management which she endorsed as being beneficial for her. Pt endorses feelings of anxiety and depression.  Gerhard Perches 04/22/2022, 8:27 PM

## 2022-04-22 NOTE — Progress Notes (Signed)
   04/22/22 2057  Level of Consciousness  Level of Consciousness Alert  Pain Assessment  Pain Scale 0-10  Pain Score 10  Pain Type Acute pain  Pain Location Abdomen  Pain Orientation Mid  Pain Descriptors / Indicators Burning  Pain Onset Sudden  Pain Intervention(s) Medication (See eMAR);Rest  Multiple Pain Sites Yes  2nd Pain Site  Pain Score 10  Pain Type Acute pain  Pain Location Chest  Pain Orientation Lower  Pain Descriptors / Indicators Burning  Pain Onset Sudden   Patient reported 10/10 pain in abdomen and chest, described as "burning". Patient given PRN medication and monitored, along with EKG. Patient denied nausea and vomiting. Patient reported symptoms resolved.

## 2022-04-22 NOTE — Group Note (Unsigned)
Recreation Therapy Group Note   Group Topic:Coping Skills  Group Date: 04/22/2022 Start Time: 1000 End Time: 1050 Facilitators: Casie Sturgeon-McCall, Kazoua Gossen A, NT Location: [address] Dayroom       Affect/Mood: {RT BHH Affect/Mood:26271}   Participation Level: {RT BHH Participation Level:26267}   Participation Quality: {RT BHH Participation Quality:26268}   Behavior: {RT BHH Group Behavior:26269}   Speech/Thought Process: {RT BHH Speech/Thought:26276}   Insight: {RT BHH Insight:26272}   Judgement: {RT BHH Judgement:26278}   Modes of Intervention: {RT BHH Modes of Intervention:26277}   Patient Response to Interventions:  {RT BHH Patient Response to Intervention:26274}   Education Outcome:  {RT BHH Education Outcome:26279}   Clinical Observations/Individualized Feedback: *** was *** in their participation of session activities and group discussion. Pt identified ***   Plan: {RT BHH Tx Plan:26280}   Ami Thornsberry A Obbie Lewallen-McCall, NT,  04/22/2022 11:10 AM 

## 2022-04-22 NOTE — Progress Notes (Signed)
[facility] MD Progress Note  04/22/2022 3:38 PM Marie Diaz  MRN:  [id number]  HPI:  Marie Diaz is a 56 yr old female who presented to [facility] on 10/13 due to depression, SI, and command auditory hallucinations, she was admitted to [facility] on 10/14.  PPHx is significant for Anxiety and no history of Suicide Attempts, Self Injurious Behavior, or Psychiatric Hospitalizations.   24 hr chart review: BP   Today's nursing assessment note: On assessment today, pt continues to present with a flat affect and depressed mood, mild tremors continuing to be noted to b/l fingers and arms, but most likely related to anxiety. Her attention to personal hygiene and grooming today than it was yesterday, and pt reports that she had a shower yesterday. Eye contact is good, speech is clear & coherent. Thought contents are organized and logical, and pt currently denies SI/HI/AVH or paranoia. There is no evidence of delusional thoughts.    Patient however, is continuing to report feeling very depressed and anxious, rates anxiety as well as depression 8 (10 being worst).  Patient reports that current medications have helped with her psychosis, but have not been helpful with treatment of her mood.  Patient reports that she has a son who lives with her who is supportive, but on further questioning she reports a weak support system, states that this son does not work and has a history of drinking alcohol.  She reports that she has a daughter, also reports that the daughter does not work.  Patient reports that her stress source and mostly financial in nature, and also still grieving the death of her husband who passed away in 08/13/2022 of this year due to an overdose on heroin.  Patient reports that she does not have any money, and her father assist her with some of her utilities, and she has food stamps for food.   Pt denies any medication related side effects, has mild tremors to b/l fingers and tongue, which is most likely, related  to anxiety. We are discontinuing Remeron due to lack of effectiveness on this medication. Trazodone increased to 100 mg nightly for insomnia. Adding Zoloft starting today at 50 mg and increasing tomorrow to 100 mg for management of depressive symptoms.   Principal Problem: Major depressive disorder, single episode with psychotic features ([place]) Diagnosis: Principal Problem:   Major depressive disorder, single episode with psychotic features (Fernando Salinas)  Total Time spent with patient:  I personally spent 30 minutes on the unit in direct patient care. The direct patient care time included face-to-face time with the patient, reviewing the patient's chart, communicating with other professionals, and coordinating care. Greater than 50% of this time was spent in counseling or coordinating care with the patient regarding goals of hospitalization, psycho-education, and discharge planning needs.  Past Psychiatric History: Anxiety and no history of Suicide Attempts, Self Injurious Behavior, or Psychiatric Hospitalizations.   Past Medical History:  Past Medical History:  Diagnosis Date   Anxiety    Depression    Gastric ulcer    Gastritis    Hypertension     Past Surgical History:  Procedure Laterality Date   ABDOMINAL HYSTERECTOMY     BIOPSY  08/29/2019   Procedure: BIOPSY;  Surgeon: Danie Binder, MD;  Location: [facility];  Service: Endoscopy;;   BIOPSY  02/26/2021   Procedure: BIOPSY;  Surgeon: Eloise Harman, DO;  Location: [facility];  Service: Endoscopy;;   CHOLECYSTECTOMY     ESOPHAGOGASTRODUODENOSCOPY N/A 08/29/2019  normal esophagus, localized moderate inflammation with adherent blood, congestion, erosions, and friability on greater curvature of stomach. Biopsy with chronic gastritis, negative H.pylori.    ESOPHAGOGASTRODUODENOSCOPY (EGD) WITH PROPOFOL N/A 02/26/2021   gastritis and pathology with reactive gastropathy and negative H.pylori   HEMORRHOID SURGERY     Family  History:  Family History  Problem Relation Age of Onset   Colon cancer Neg Hx    Colon polyps Neg Hx    Family Psychiatric  History: Reports None Social History:  Social History   Substance and Sexual Activity  Alcohol Use No     Social History   Substance and Sexual Activity  Drug Use No    Social History   Socioeconomic History   Marital status: Married    Spouse name: Not on file   Number of children: Not on file   Years of education: Not on file   Highest education level: Not on file  Occupational History   Not on file  Tobacco Use   Smoking status: Every Day    Packs/day: 0.50    Types: Cigarettes   Smokeless tobacco: Never  Vaping Use   Vaping Use: Former  Substance and Sexual Activity   Alcohol use: No   Drug use: No   Sexual activity: Yes  Other Topics Concern   Not on file  Social History Narrative   [id number]), kids(2). 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 SHE'S AN ONLY CHILD.   Social Determinants of Health   Financial Resource Strain: Not on file  Food Insecurity: Not on file  Transportation Needs: Not on file  Physical Activity: Not on file  Stress: Not on file  Social Connections: Not on file   Additional Social History:     Sleep: Poor  Appetite:  Fair  Current Medications: Current Facility-Administered Medications  Medication Dose Route Frequency Provider Last Rate Last Admin   acetaminophen (TYLENOL) tablet 650 mg  650 mg Oral Q6H PRN Lauree Chandler, NP       ALPRAZolam Prudy Feeler) tablet 0.5 mg  0.5 mg Oral BID PRN Thalia Party, MD   0.5 mg at 04/22/22 0743   alum & mag hydroxide-simeth (MAALOX/MYLANTA) 200-200-20 MG/5ML suspension 30 mL  30 mL Oral Q4H PRN Lauree Chandler, NP       amLODipine (NORVASC) tablet 10 mg  10 mg Oral Daily Starleen Blue, NP   10 mg at 04/22/22 0817   benztropine (COGENTIN) tablet 0.5 mg  0.5 mg Oral BID PRN Leevy-Johnson, Brooke A, NP   0.5 mg at  04/22/22 4196   docusate sodium (COLACE) capsule 100 mg  100 mg Oral Daily Artis Buechele, Tyler Aas, NP   100 mg at 04/22/22 0741   famotidine (PEPCID) tablet 20 mg  20 mg Oral Daily Maleeah Crossman, NP   20 mg at 04/22/22 0741   feeding supplement (ENSURE ENLIVE / ENSURE PLUS) liquid 237 mL  237 mL Oral BID BM Lauree Chandler, NP   237 mL at 04/22/22 1340   folic acid (FOLVITE) tablet 1 mg  1 mg Oral Daily Paliy, Serina Cowper, MD   1 mg at 04/22/22 0741   haloperidol (HALDOL) tablet 5 mg  5 mg Oral BID Leevy-Johnson, Brooke A, NP   5 mg at 04/22/22 0741   hydrOXYzine (ATARAX) tablet 25 mg  25 mg Oral TID PRN Starleen Blue, NP   25 mg at 04/21/22 2339   [START ON 04/23/2022] irbesartan (  AVAPRO) tablet 75 mg  75 mg Oral Daily Ura Hausen, NP       magnesium hydroxide (MILK OF MAGNESIA) suspension 30 mL  30 mL Oral Daily PRN Lauree Chandler, NP       mirtazapine (REMERON) tablet 45 mg  45 mg Oral QHS Alaa Eyerman, NP   45 mg at 04/21/22 2107   OLANZapine zydis (ZYPREXA) disintegrating tablet 5 mg  5 mg Oral Q8H PRN Lauree Chandler, NP   5 mg at 04/16/22 2351   traZODone (DESYREL) tablet 75 mg  75 mg Oral QHS Starleen Blue, NP   75 mg at 04/21/22 2107    Lab Results:  No results found for this or any previous visit (from the past 48 hour(s)).   Blood Alcohol level:  Lab Results  Component Value Date   ETH <10 04/11/2022   ETH <10 06/11/2019    Metabolic Disorder Labs: Lab Results  Component Value Date   HGBA1C 4.8 04/11/2022   MPG 91.06 04/11/2022   No results found for: "PROLACTIN" Lab Results  Component Value Date   CHOL 177 04/11/2022   TRIG 193 (H) 04/11/2022   HDL 41 04/11/2022   CHOLHDL 4.3 04/11/2022   VLDL 39 04/11/2022   LDLCALC 97 04/11/2022    Physical Findings: AIMS: 0 CIWA:  n/a COWS: n/a    Musculoskeletal: Strength & Muscle Tone: decreased Gait & Station: normal Patient leans: N/A  Psychiatric Specialty Exam:  Presentation  General Appearance:   Appropriate for Environment; Disheveled  Eye Contact: Good  Speech: Clear and Coherent  Speech Volume: Normal  Handedness:Right   Mood and Affect  Mood: Depressed; Anxious  Affect: Congruent   Thought Process  Thought Processes: Coherent  Descriptions of Associations:Intact  Orientation:Full (Time, Place and Person)  Thought Content:Logical  History of Schizophrenia/Schizoaffective disorder:No  Duration of Psychotic Symptoms:Less than six months  Hallucinations:Hallucinations: None  Ideas of Reference:None  Suicidal Thoughts:Suicidal Thoughts: No  Homicidal Thoughts:Homicidal Thoughts: No    Sensorium  Memory: Immediate Good  Judgment: Fair  Insight: Fair   Art therapist  Concentration: Fair  Attention Span: Fair  Recall: Fair  Fund of Knowledge: Fair  Language: Fair   Psychomotor Activity  Psychomotor Activity:Psychomotor Activity: Normal    Assets  Assets: Resilience   Sleep  Sleep:Sleep: Fair   Physical Exam: Physical Exam Vitals and nursing note reviewed.  Constitutional:      General: She is not in acute distress.    Appearance: Normal appearance. She is normal weight. She is ill-appearing. She is not toxic-appearing.  HENT:     Head: Normocephalic and atraumatic.  Pulmonary:     Effort: Pulmonary effort is normal.  Musculoskeletal:        General: Normal range of motion.  Neurological:     General: No focal deficit present.     Mental Status: She is alert and oriented to person, place, and time.    Review of Systems  Constitutional: Negative.   HENT: Negative.    Eyes: Negative.   Respiratory:  Negative for cough and shortness of breath.   Cardiovascular:  Negative for chest pain.  Gastrointestinal:  Negative for abdominal pain, constipation, diarrhea, nausea and vomiting.  Skin: Negative.   Neurological:  Positive for weakness (mild). Negative for dizziness and headaches (mild).   Psychiatric/Behavioral:  Positive for depression. Negative for hallucinations, substance abuse and suicidal ideas (passive SI). The patient is nervous/anxious and has insomnia.    Blood pressure (!) 142/85, pulse 96,  temperature 97.8 F (36.6 C), temperature source Oral, resp. rate 16, height 5\' 1"  (1.549 m), SpO2 98 %. Body mass index is 18.89 kg/m.  Treatment Plan Summary: Daily contact with patient to assess and evaluate symptoms and progress in treatment and Medication management PLAN Safety and Monitoring: Involuntary admission to inpatient psychiatric unit for safety, stabilization and treatment Daily contact with patient to assess and evaluate symptoms and progress in treatment Patient's case to be discussed in multi-disciplinary team meeting Observation Level : q15 minute checks Vital signs: q12 hours Precautions:Safety  MDD, Recurrent, Severe, w/Psychosis: -Discontinue Remeron 45 mg QHS for depression -Start Zoloft 50 mg today followed by an increase to 100 mg starting 10/25 -Continue Haldol 5 mg BID for psychosis -Previously discontinued Zyprexa to 15 mg d/t lack of efficacy -Continue Xanax 0.5 mg BID PRN for anxiety -Continue Agitation Protocol: Zyprexa/Ativan/Geodon  HTN: -Continue Amlodipine to 10 mg daily -Change Clonidine to 0.1 mg PRN for SBP>160 & DBP>100 -Increase Irbesartan 75 mg daily  GERD -Continue Famotidine 20 mg daily  Constipation -Continue Colace 100 mg daily  Folate Deficiency: -Continue Folic Acid 1 mg daily  Insomnia -Continue Trazodone to 75 mg nightly  Hypokalemia Resolved: -One time dose Kdur 40 mEq -K now 4.1 from 10/17  -Continue Ensure 237 mg BID -Continue PRN's: Tylenol, Maalox, Atarax, Milk of Magnesia  Labs: 10/13- CMP: WNL except for K: 3.0,  BUN < 5,  Total Protein: 6.4,  CBC: WNL except WBC: 11.8,  RBC: 5.27,  Hem: 16.7,  MCHC: 36.5,  Neut Abs: 8.4, Mono Abs: 1.1, Lipid Panel: WNL except Trig: 193,  A1c: 4.8, TSH: 1.902,   UDS: Benzo pos,  EtOH: Neg,  EKG: Sinus Tach with Qtc: 439 10/14- UA: WNL with exception of straw color  10/15-  RPR: Neg,  Folate: 5.2,  Ceruloplasmin: 15.6,  HIV nonreactive  11/15, NP 04/22/2022, 3:38 PMPatient ID: 04/24/2022, female   DOB: Sep 26, 1965, 56 y.o. Patient ID: [id number], female  Patient ID: Vikki Ports, female

## 2022-04-22 NOTE — Progress Notes (Signed)
   04/22/22 2057  Psych Admission Type (Psych Patients Only)  Admission Status Involuntary  Psychosocial Assessment  Patient Complaints Anxiety;Depression  Eye Contact Fair  Facial Expression Worried;Anxious;Sad  Affect Anxious;Appropriate to circumstance  Speech Logical/coherent;Soft  Interaction Forwards little  Motor Activity Slow  Appearance/Hygiene Unremarkable  Behavior Characteristics Cooperative;Appropriate to situation  Mood Depressed;Anxious;Pleasant  Thought Process  Coherency WDL  Content WDL  Delusions None reported or observed  Perception WDL  Hallucination None reported or observed  Judgment WDL  Confusion None  Danger to Self  Current suicidal ideation? Denies  Self-Injurious Behavior No self-injurious ideation or behavior indicators observed or expressed   Agreement Not to Harm Self Yes  Description of Agreement Verbal contract  Danger to Others  Danger to Others None reported or observed  Danger to Others Abnormal  Harmful Behavior to others No threats or harm toward other people  Destructive Behavior No threats or harm toward property   Patient alert, oriented and demonstrating an anxious affect and depressed mood. Patient reports anxiety and depression 8/10. Patient denies SI, HI, AVH. Scheduled and PRN  medications administered to patient, per provider orders. Support and encouragement provided. Routine safety checks conducted every 15 minutes. Patient contracts for safety and remains safe on the unit.

## 2022-04-22 NOTE — Progress Notes (Signed)
Pt remains very anxious, tremulous with flat /depressed affect. Required multiple verbal redirections / prompts to come and stay in dayroom for scheduled groups. Denies SI, HI, AVH and pain this shift. Received PRNs Xanax and Vistaril as ordered for anxiety with some relief. PRN Cogentin 0.5 given earlier on previous shift at 0611 without relief at time of assessment. Continued support, encouragement and reassurance provided to pt this shift. Q 15 minutes safety checks maintained without incident thus far. Pt tolerates meals and fluids well. Verbal education done on current treatment regimen and pt is compliant. BP remains slightly elevated. Pt went off unit with peers for meals and fresh air break and meals, returned without issues. Safety maintained.

## 2022-04-23 DIAGNOSIS — F323 Major depressive disorder, single episode, severe with psychotic features: Secondary | ICD-10-CM | POA: Diagnosis not present

## 2022-04-23 NOTE — Progress Notes (Signed)
   04/23/22 2109  Psych Admission Type (Psych Patients Only)  Admission Status Involuntary  Psychosocial Assessment  Patient Complaints Anxiety;Appetite decrease  Eye Contact Fair  Facial Expression Anxious;Worried  Affect Appropriate to circumstance  Speech Logical/coherent  Interaction Forwards little;Guarded  Motor Activity Slow  Appearance/Hygiene Unremarkable  Behavior Characteristics Cooperative;Appropriate to situation  Mood Anxious  Thought Process  Coherency WDL  Content WDL  Delusions None reported or observed  Perception WDL  Hallucination None reported or observed  Judgment WDL  Confusion None  Danger to Self  Current suicidal ideation? Denies  Self-Injurious Behavior No self-injurious ideation or behavior indicators observed or expressed   Agreement Not to Harm Self Yes  Description of Agreement Verbal contract  Danger to Others  Danger to Others None reported or observed  Danger to Others Abnormal  Harmful Behavior to others No threats or harm toward other people  Destructive Behavior No threats or harm toward property   Patient alert and oriented, appropriate to circumstance and anxious mood. Patient denies SI, HI, AVH. Patient requested PRN for heartburn. Patient declined PRN for anxiety at this time. Pt informed to let staff know if anxiety increases.Scheduled/PRN medications administered to patient, per provider orders. Support and encouragement provided. Routine safety checks conducted every 15 minutes. Patient contracts for safety and remains safe on the unit.

## 2022-04-23 NOTE — Progress Notes (Addendum)
[facility] MD Progress Note  04/23/2022 4:18 PM Marie Diaz  MRN:  [id number]  HPI:  Marie Diaz is a 56 yr old female who presented to [facility] on 10/13 due to depression, SI, and command auditory hallucinations, she was admitted to [facility] on 10/14.  PPHx is significant for Anxiety and no history of Suicide Attempts, Self Injurious Behavior, or Psychiatric Hospitalizations.   24 hr chart review: Blood pressure still with periods of elevation, with HR also slightly elevated, but overall, much improved that it has been in the past couple of days. Pt slept for a total of 7.5 hrs last night, and continues to take all medications as scheduled.  Last night, patient required the following as needed medications; Xanax 0.5 mg for anxiety and hydroxyzine 25 mg for anxiety.  Today's nursing assessment note: Today, patient presents with a slight improvement in her mood, but continues to report feeling sad, down, and depressed.  She rates her depression on a scale from 0-10 with 10 being the worst as 8.  Using the same scale, she rates her anxiety as 8.  She however, denies suicidal ideations, denies AVH, denies paranoia, and there is no evidence of delusional thinking.  Patient reported some nausea in the morning which rendered her unable to eat breakfast, but she ate lunch.  Staff is continuing to be educated to keep a food log so that we can ascertain how much patient is eating.  She reports a good sleep quality last night and denies being in any physical pain today.  Patient observed to have moderate amount of tremors to bilateral hands and legs, but states that this is how she gets when she becomes very anxious.  She reports that the tremors were not existent prior to this hospitalization.  She denies any medication related side effects.  Patient educated that the nausea might be due to the Zoloft and that it should resolve within a few days.  Zofran as needed ordered for nausea.  Continuing other medications as  listed below.  Tentative discharge date for patient is Friday, 04/25/2022, if safety planning is completed with her family and order follow-up appointments are completed.  This has been explained to patient who has verbalized understanding.  Today patient was unable to provide phone number for a family to be contacted for discharge planning.  We will revisit this tomorrow.  Principal Problem: Major depressive disorder, single episode with psychotic features (HCC) Diagnosis: Principal Problem:   Major depressive disorder, single episode with psychotic features (HCC)  Total Time spent with patient:  I personally spent 30 minutes on the unit in direct patient care. The direct patient care time included face-to-face time with the patient, reviewing the patient's chart, communicating with other professionals, and coordinating care. Greater than 50% of this time was spent in counseling or coordinating care with the patient regarding goals of hospitalization, psycho-education, and discharge planning needs.  Past Psychiatric History: Anxiety and no history of Suicide Attempts, Self Injurious Behavior, or Psychiatric Hospitalizations.   Past Medical History:  Past Medical History:  Diagnosis Date   Anxiety    Depression    Gastric ulcer    Gastritis    Hypertension     Past Surgical History:  Procedure Laterality Date   ABDOMINAL HYSTERECTOMY     BIOPSY  08/29/2019   Procedure: BIOPSY;  Surgeon: West Bali, MD;  Location: [facility];  Service: Endoscopy;;   BIOPSY  02/26/2021   Procedure: BIOPSY;  Surgeon: Marletta Lor,  Hennie Duos, DO;  Location: [facility];  Service: Endoscopy;;   CHOLECYSTECTOMY     ESOPHAGOGASTRODUODENOSCOPY N/A 08/29/2019   normal esophagus, localized moderate inflammation with adherent blood, congestion, erosions, and friability on greater curvature of stomach. Biopsy with chronic gastritis, negative H.pylori.    ESOPHAGOGASTRODUODENOSCOPY (EGD) WITH PROPOFOL N/A  02/26/2021   gastritis and pathology with reactive gastropathy and negative H.pylori   HEMORRHOID SURGERY     Family History:  Family History  Problem Relation Age of Onset   Colon cancer Neg Hx    Colon polyps Neg Hx    Family Psychiatric  History: Reports None Social History:  Social History   Substance and Sexual Activity  Alcohol Use No     Social History   Substance and Sexual Activity  Drug Use No    Social History   Socioeconomic History   Marital status: Married    Spouse name: Not on file   Number of children: Not on file   Years of education: Not on file   Highest education level: Not on file  Occupational History   Not on file  Tobacco Use   Smoking status: Every Day    Packs/day: 0.50    Types: Cigarettes   Smokeless tobacco: Never  Vaping Use   Vaping Use: Former  Substance and Sexual Activity   Alcohol use: No   Drug use: No   Sexual activity: Yes  Other Topics Concern   Not on file  Social History Narrative   [phone]), kids(2). 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 SHE'S AN ONLY CHILD.   Social Determinants of Health   Financial Resource Strain: Not on file  Food Insecurity: Not on file  Transportation Needs: Not on file  Physical Activity: Not on file  Stress: Not on file  Social Connections: Not on file   Additional Social History:     Sleep: Poor  Appetite:  Fair  Current Medications: Current Facility-Administered Medications  Medication Dose Route Frequency Provider Last Rate Last Admin   acetaminophen (TYLENOL) tablet 650 mg  650 mg Oral Q6H PRN Lauree Chandler, NP       ALPRAZolam Prudy Feeler) tablet 0.5 mg  0.5 mg Oral BID PRN Thalia Party, MD   0.5 mg at 04/23/22 1210   alum & mag hydroxide-simeth (MAALOX/MYLANTA) 200-200-20 MG/5ML suspension 30 mL  30 mL Oral Q4H PRN Lauree Chandler, NP   30 mL at 04/22/22 2050   amLODipine (NORVASC) tablet 10 mg  10 mg Oral Daily  Starleen Blue, NP   10 mg at 04/23/22 0756   benztropine (COGENTIN) tablet 0.5 mg  0.5 mg Oral BID PRN Leevy-Johnson, Brooke A, NP   0.5 mg at 04/23/22 1211   cloNIDine (CATAPRES) tablet 0.1 mg  0.1 mg Oral BID PRN Starleen Blue, NP       docusate sodium (COLACE) capsule 100 mg  100 mg Oral Daily Ephraim Reichel, NP   100 mg at 04/23/22 0756   donepezil (ARICEPT) tablet 5 mg  5 mg Oral QHS Attiah, Nadir, MD   5 mg at 04/22/22 2057   famotidine (PEPCID) tablet 20 mg  20 mg Oral Daily Ellajane Stong, NP   20 mg at 04/23/22 0756   feeding supplement (ENSURE ENLIVE / ENSURE PLUS) liquid 237 mL  237 mL Oral BID BM Lauree Chandler, NP   237 mL at 04/23/22 1346   folic acid (FOLVITE) tablet 1  mg  1 mg Oral Daily Paliy, Delrae Rend, MD   1 mg at 04/23/22 0756   haloperidol (HALDOL) tablet 5 mg  5 mg Oral BID Leevy-Johnson, Brooke A, NP   5 mg at 04/23/22 0756   hydrOXYzine (ATARAX) tablet 25 mg  25 mg Oral TID PRN Nicholes Rough, NP   25 mg at 04/22/22 1113   irbesartan (AVAPRO) tablet 75 mg  75 mg Oral Daily Niyah Mamaril, Tamela Oddi, NP   75 mg at 04/23/22 0756   magnesium hydroxide (MILK OF MAGNESIA) suspension 30 mL  30 mL Oral Daily PRN Tharon Aquas, NP       methylphenidate (RITALIN) tablet 5 mg  5 mg Oral Daily Alhassan Everingham, NP   5 mg at 04/23/22 0800   OLANZapine zydis (ZYPREXA) disintegrating tablet 5 mg  5 mg Oral Q8H PRN Tharon Aquas, NP   5 mg at 04/16/22 2351   sertraline (ZOLOFT) tablet 100 mg  100 mg Oral Daily Nicholes Rough, NP   100 mg at 04/23/22 0756   traZODone (DESYREL) tablet 100 mg  100 mg Oral QHS Nicholes Rough, NP   100 mg at 04/22/22 2055    Lab Results:  No results found for this or any previous visit (from the past 13 hour(s)).   Blood Alcohol level:  Lab Results  Component Value Date   ETH <10 04/11/2022   ETH <10 85/08/7739    Metabolic Disorder Labs: Lab Results  Component Value Date   HGBA1C 4.8 04/11/2022   MPG 91.06 04/11/2022   No results found for:  "PROLACTIN" Lab Results  Component Value Date   CHOL 177 04/11/2022   TRIG 193 (H) 04/11/2022   HDL 41 04/11/2022   CHOLHDL 4.3 04/11/2022   VLDL 39 04/11/2022   LDLCALC 97 04/11/2022    Physical Findings: AIMS: 0 CIWA:  n/a COWS: n/a    Musculoskeletal: Strength & Muscle Tone: decreased Gait & Station: normal Patient leans: N/A  Psychiatric Specialty Exam:  Presentation  General Appearance:  Disheveled  Eye Contact: Good  Speech: Clear and Coherent  Speech Volume: Normal  Handedness:Right   Mood and Affect  Mood: Depressed; Anxious  Affect: Congruent  Thought Process  Thought Processes: Coherent  Descriptions of Associations:Intact  Orientation:Full (Time, Place and Person)  Thought Content:Logical  History of Schizophrenia/Schizoaffective disorder:No  Duration of Psychotic Symptoms:Less than six months  Hallucinations:Hallucinations: None  Ideas of Reference:None  Suicidal Thoughts:Suicidal Thoughts: No  Homicidal Thoughts:Homicidal Thoughts: No  Sensorium  Memory: Immediate Good  Judgment: Poor  Insight: Poor   Executive Functions  Concentration: Fair  Attention Span: Fair  Recall: [place] of Knowledge: Fair  Language: Fair  Psychomotor Activity  Psychomotor Activity:Psychomotor Activity: Other (comment) (tremors to b/l arms and feet but states that it is related to anxiety and preexisting prior to this hospitalization)  Assets  Assets: Resilience  Sleep  Sleep:Sleep: Good  Physical Exam: Physical Exam Vitals and nursing note reviewed.  Constitutional:      General: She is not in acute distress.    Appearance: Normal appearance. She is normal weight. She is ill-appearing. She is not toxic-appearing.  HENT:     Head: Normocephalic and atraumatic.  Eyes:     Pupils: Pupils are equal, round, and reactive to light.  Pulmonary:     Effort: Pulmonary effort is normal.  Musculoskeletal:         General: Normal range of motion.  Neurological:     General: No focal deficit  present.     Mental Status: She is alert and oriented to person, place, and time.    Review of Systems  Constitutional: Negative.   HENT: Negative.    Eyes: Negative.   Respiratory:  Negative for cough and shortness of breath.   Cardiovascular:  Negative for chest pain.  Gastrointestinal:  Negative for abdominal pain, constipation, diarrhea, nausea and vomiting.  Skin: Negative.   Neurological:  Positive for weakness (mild). Negative for dizziness and headaches (mild).  Psychiatric/Behavioral:  Positive for depression. Negative for hallucinations, substance abuse and suicidal ideas (passive SI). The patient is nervous/anxious and has insomnia.    Blood pressure 104/71, pulse (!) 108, temperature (!) 97.5 F (36.4 C), temperature source Oral, resp. rate 16, height 5\' 1"  (1.549 m), SpO2 97 %. Body mass index is 18.89 kg/m.  Treatment Plan Summary: Daily contact with patient to assess and evaluate symptoms and progress in treatment and Medication management PLAN Safety and Monitoring: Involuntary admission to inpatient psychiatric unit for safety, stabilization and treatment Daily contact with patient to assess and evaluate symptoms and progress in treatment Patient's case to be discussed in multi-disciplinary team meeting Observation Level : q15 minute checks Vital signs: q12 hours Precautions:Safety  MDD, Recurrent, Severe, w/Psychosis: -Continue Ritalin 5 mg daily for energy stimulation -Previously discontinued Remeron 45 mg QHS for depression -Continue Zoloft 100 mg for depressive symptoms -Continue Haldol 5 mg BID for psychosis -Previously discontinued Zyprexa to 15 mg d/t lack of efficacy -Continue Xanax 0.5 mg BID PRN for anxiety -Continue Aricept 5 mg daily for memory loss -Continue Agitation Protocol: Zyprexa/Ativan/Geodon  HTN: -Continue Amlodipine to 10 mg daily -Continue Clonidine to 0.1  mg PRN for SBP>160 & DBP>100 -Continue Irbesartan 75 mg daily  GERD -Continue Famotidine 20 mg daily  Constipation -Continue Colace 100 mg daily  Folate Deficiency: -Continue Folic Acid 1 mg daily  Insomnia -Continue Trazodone to 75 mg nightly  Hypokalemia Resolved: -One time dose Kdur 40 mEq -K now 4.1 from 10/17  -Continue Ensure 237 mg BID -Continue PRN's: Tylenol, Maalox, Atarax, Milk of Magnesia  Labs: 10/13- CMP: WNL except for K: 3.0,  BUN < 5,  Total Protein: 6.4,  CBC: WNL except WBC: 11.8,  RBC: 5.27,  Hem: 16.7,  MCHC: 36.5,  Neut Abs: 8.4, Mono Abs: 1.1, Lipid Panel: WNL except Trig: 193,  A1c: 4.8, TSH: 1.902,  UDS: Benzo pos,  EtOH: Neg,  EKG: Sinus Tach with Qtc: 439 10/14- UA: WNL with exception of straw color  10/15-  RPR: Neg,  Folate: 5.2,  Ceruloplasmin: 15.6,  HIV nonreactive  11/15, NP 04/23/2022, 4:18 PMPatient ID: 04/25/2022, female   DOB: 1965-08-31, 56 y.o. Patient ID: [id number], female  Patient ID: Vikki Ports, femalePatient ID: Vikki Ports, female   DOB: 06/28/1966, 56 y.o.   MRN: [id number]

## 2022-04-23 NOTE — Progress Notes (Signed)
Pt presents with flat affect, depressed mood and is very anxious and tremulous (bilaterally hands and legs, moderate) on interactions. Denies SI, HI, AVH and pain. Observed to be very preoccupied about current situation "I'm going to have a nervous breakdown, this is from just everything that happened to me. I can't go through this, please help. I need help. It's going to happen, it's going to happened'. Received PRN Xanax 0.5 mg and cogentin 0.5 mg both PO for anxiety and tremors with some relief when reassessed at 1300. Required multiple prompts to come to dayroom for groups. Appetite remains poor. Refused both breakfast and lunch "I don't want it, I will vomit. I'm not hungry right now. I'm going to have a nervous breakdown". Drank 2 bottles of ensures with  gatorade, water and juice, all recorded on intake sheet. Continue support, reassurance and encouragement provided to pt. Safety checks maintained at Q 15 minutes intervals. Pt remains safe in milieu. Attended CSW with increased prompts.

## 2022-04-23 NOTE — Group Note (Signed)
LCSW Group Therapy Note   Group Date: 04/23/2022 Start Time: 1300 End Time: 1400   Type of Therapy and Topic:  Group Therapy: Boundaries  Participation Level:  Minimal  Description of Group: This group will address the use of boundaries in their personal lives. Patients will explore why boundaries are important, the difference between healthy and unhealthy boundaries, and negative and postive outcomes of different boundaries and will look at how boundaries can be crossed.  Patients will be encouraged to identify current boundaries in their own lives and identify what kind of boundary is being set. Facilitators will guide patients in utilizing problem-solving interventions to address and correct types boundaries being used and to address when no boundary is being used. Understanding and applying boundaries will be explored and addressed for obtaining and maintaining a balanced life. Patients will be encouraged to explore ways to assertively make their boundaries and needs known to significant others in their lives, using other group members and facilitator for role play, support, and feedback.  Therapeutic Goals:  1.  Patient will identify areas in their life where setting clear boundaries could be  used to improve their life.  2.  Patient will identify signs/triggers that a boundary is not being respected. 3.  Patient will identify two ways to set boundaries in order to achieve balance in  their lives: 4.  Patient will demonstrate ability to communicate their needs and set boundaries  through discussion and/or role plays  Summary of Patient Progress:  Marie Diaz was present/active throughout the session and proved open to feedback from [place] and peers. Patient demonstrated good insight into the subject matter, was respectful of peers, and was present throughout the entire session.  Therapeutic Modalities:   Cognitive Behavioral Therapy Solution-Focused Therapy  Zachery Conch,  LCSW 04/23/2022  1:48 PM

## 2022-04-24 DIAGNOSIS — F323 Major depressive disorder, single episode, severe with psychotic features: Secondary | ICD-10-CM | POA: Diagnosis not present

## 2022-04-24 NOTE — Progress Notes (Signed)
[facility] MD Progress Note  04/24/2022 2:49 PM Marie Diaz  MRN:  [id number]  HPI:  Marie Diaz is a 56 yr old female who presented to [facility] on 10/13 due to depression, SI, and command auditory hallucinations, she was admitted to [facility] on 10/14.  PPHx is significant for Anxiety and no history of Suicide Attempts, Self Injurious Behavior, or Psychiatric Hospitalizations.   24 hr chart review: Blood pressure still with periods of elevation, with HR also slightly elevated, but overall, much improved that it has been in the past couple of days. Pt slept for a total of 7.5 hrs last night, and continues to take all medications as scheduled.  Last night, patient required the following as needed medications; Xanax 0.5 mg for anxiety and hydroxyzine 25 mg for anxiety.  Today's nursing assessment note: Today, patient presents with a slight improvement in her mood, but continues to report feeling sad, down, and depressed.  She rates her depression on a scale from 0-10 with 10 being the worst as 8.  Using the same scale, she rates her anxiety as 8.  She however, denies suicidal ideations, denies AVH, denies paranoia, and there is no evidence of delusional thinking.  Patient reported some nausea in the morning which rendered her unable to eat breakfast, but she ate lunch.  Staff is continuing to be educated to keep a food log so that we can ascertain how much patient is eating.  She reports a good sleep quality last night and denies being in any physical pain today.  Patient observed to have moderate amount of tremors to bilateral hands and legs, but states that this is how she gets when she becomes very anxious.  She reports that the tremors were not existent prior to this hospitalization.  She denies any medication related side effects.  Patient educated that the nausea might be due to the Zoloft and that it should resolve within a few days.  Zofran as needed ordered for nausea.  Continuing other medications as  listed below.  Tentative discharge date for patient is Friday, 04/25/2022, if safety planning is completed with her family and order follow-up appointments are completed.  This has been explained to patient who has verbalized understanding.  Today patient was unable to provide phone number for a family to be contacted for discharge planning.  We will revisit this tomorrow.  Principal Problem: Major depressive disorder, single episode with psychotic features (HCC) Diagnosis: Principal Problem:   Major depressive disorder, single episode with psychotic features (HCC)  Total Time spent with patient:  I personally spent 30 minutes on the unit in direct patient care. The direct patient care time included face-to-face time with the patient, reviewing the patient's chart, communicating with other professionals, and coordinating care. Greater than 50% of this time was spent in counseling or coordinating care with the patient regarding goals of hospitalization, psycho-education, and discharge planning needs.  Past Psychiatric History: Anxiety and no history of Suicide Attempts, Self Injurious Behavior, or Psychiatric Hospitalizations.   Past Medical History:  Past Medical History:  Diagnosis Date   Anxiety    Depression    Gastric ulcer    Gastritis    Hypertension     Past Surgical History:  Procedure Laterality Date   ABDOMINAL HYSTERECTOMY     BIOPSY  08/29/2019   Procedure: BIOPSY;  Surgeon: West Bali, MD;  Location: [facility];  Service: Endoscopy;;   BIOPSY  02/26/2021   Procedure: BIOPSY;  Surgeon: Marletta Lor,  Elon Alas, DO;  Location: [facility];  Service: Endoscopy;;   CHOLECYSTECTOMY     ESOPHAGOGASTRODUODENOSCOPY N/A 08/29/2019   normal esophagus, localized moderate inflammation with adherent blood, congestion, erosions, and friability on greater curvature of stomach. Biopsy with chronic gastritis, negative H.pylori.    ESOPHAGOGASTRODUODENOSCOPY (EGD) WITH PROPOFOL N/A  02/26/2021   gastritis and pathology with reactive gastropathy and negative H.pylori   HEMORRHOID SURGERY     Family History:  Family History  Problem Relation Age of Onset   Colon cancer Neg Hx    Colon polyps Neg Hx    Family Psychiatric  History: Reports None Social History:  Social History   Substance and Sexual Activity  Alcohol Use No     Social History   Substance and Sexual Activity  Drug Use No    Social History   Socioeconomic History   Marital status: Married    Spouse name: Not on file   Number of children: Not on file   Years of education: Not on file   Highest education level: Not on file  Occupational History   Not on file  Tobacco Use   Smoking status: Every Day    Packs/day: 0.50    Types: Cigarettes   Smokeless tobacco: Never  Vaping Use   Vaping Use: Former  Substance and Sexual Activity   Alcohol use: No   Drug use: No   Sexual activity: Yes  Other Topics Concern   Not on file  Social History Narrative   [phone]), kids(2). 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 SHE'S AN ONLY CHILD.   Social Determinants of Health   Financial Resource Strain: Not on file  Food Insecurity: Not on file  Transportation Needs: Not on file  Physical Activity: Not on file  Stress: Not on file  Social Connections: Not on file   Additional Social History:     Sleep: Poor  Appetite:  Fair  Current Medications: Current Facility-Administered Medications  Medication Dose Route Frequency Provider Last Rate Last Admin   acetaminophen (TYLENOL) tablet 650 mg  650 mg Oral Q6H PRN Tharon Aquas, NP       ALPRAZolam Duanne Moron) tablet 0.5 mg  0.5 mg Oral BID PRN Larita Fife, MD   0.5 mg at 04/23/22 1210   alum & mag hydroxide-simeth (MAALOX/MYLANTA) 200-200-20 MG/5ML suspension 30 mL  30 mL Oral Q4H PRN Tharon Aquas, NP   30 mL at 04/23/22 2110   amLODipine (NORVASC) tablet 10 mg  10 mg Oral Daily  Nicholes Rough, NP   10 mg at 04/24/22 0747   benztropine (COGENTIN) tablet 0.5 mg  0.5 mg Oral BID PRN Leevy-Johnson, Brooke A, NP   0.5 mg at 04/23/22 1211   cloNIDine (CATAPRES) tablet 0.1 mg  0.1 mg Oral BID PRN Nicholes Rough, NP       docusate sodium (COLACE) capsule 100 mg  100 mg Oral Daily Nkwenti, Doris, NP   100 mg at 04/24/22 0754   donepezil (ARICEPT) tablet 5 mg  5 mg Oral QHS Attiah, Nadir, MD   5 mg at 04/23/22 2108   famotidine (PEPCID) tablet 20 mg  20 mg Oral Daily Nkwenti, Doris, NP   20 mg at 04/24/22 0747   feeding supplement (ENSURE ENLIVE / ENSURE PLUS) liquid 237 mL  237 mL Oral BID BM Tharon Aquas, NP   237 mL at 08/65/78 4696   folic acid (FOLVITE) tablet 1  mg  1 mg Oral Daily Thalia Party, MD   1 mg at 04/24/22 0747   haloperidol (HALDOL) tablet 5 mg  5 mg Oral BID Leevy-Johnson, Brooke A, NP   5 mg at 04/24/22 0747   hydrOXYzine (ATARAX) tablet 25 mg  25 mg Oral TID PRN Starleen Blue, NP   25 mg at 04/24/22 0747   irbesartan (AVAPRO) tablet 75 mg  75 mg Oral Daily Nkwenti, Tyler Aas, NP   75 mg at 04/24/22 0747   magnesium hydroxide (MILK OF MAGNESIA) suspension 30 mL  30 mL Oral Daily PRN Lauree Chandler, NP       methylphenidate (RITALIN) tablet 5 mg  5 mg Oral Daily Nkwenti, Doris, NP   5 mg at 04/24/22 0754   OLANZapine zydis (ZYPREXA) disintegrating tablet 5 mg  5 mg Oral Q8H PRN Lauree Chandler, NP   5 mg at 04/16/22 2351   sertraline (ZOLOFT) tablet 100 mg  100 mg Oral Daily Starleen Blue, NP   100 mg at 04/24/22 0747   traZODone (DESYREL) tablet 100 mg  100 mg Oral QHS Starleen Blue, NP   100 mg at 04/23/22 2108    Lab Results:  No results found for this or any previous visit (from the past 48 hour(s)).   Blood Alcohol level:  Lab Results  Component Value Date   ETH <10 04/11/2022   ETH <10 06/11/2019    Metabolic Disorder Labs: Lab Results  Component Value Date   HGBA1C 4.8 04/11/2022   MPG 91.06 04/11/2022   No results found for:  "PROLACTIN" Lab Results  Component Value Date   CHOL 177 04/11/2022   TRIG 193 (H) 04/11/2022   HDL 41 04/11/2022   CHOLHDL 4.3 04/11/2022   VLDL 39 04/11/2022   LDLCALC 97 04/11/2022    Physical Findings: AIMS: 0 CIWA:  n/a COWS: n/a    Musculoskeletal: Strength & Muscle Tone: decreased Gait & Station: normal Patient leans: N/A  Psychiatric Specialty Exam:  Presentation  General Appearance:  Casual  Eye Contact: Fair  Speech: Clear and Coherent  Speech Volume: Normal  Handedness:Right   Mood and Affect  Mood: Anxious; Depressed  Affect: Congruent  Thought Process  Thought Processes: Coherent  Descriptions of Associations:Intact  Orientation:Full (Time, Place and Person)  Thought Content:Logical  History of Schizophrenia/Schizoaffective disorder:No  Duration of Psychotic Symptoms:Less than six months  Hallucinations:Hallucinations: None  Ideas of Reference:None  Suicidal Thoughts:Suicidal Thoughts: No  Homicidal Thoughts:Homicidal Thoughts: No  Sensorium  Memory: Immediate Good; Remote Fair; Recent Good  Judgment: Poor  Insight: Poor   Executive Functions  Concentration: Fair  Attention Span: Fair  Recall: Fair  Fund of Knowledge: Fair  Language: Fair  Psychomotor Activity  Psychomotor Activity:Psychomotor Activity: Normal  Assets  Assets: Resilience; Physical Health  Sleep  Sleep:Sleep: Good  Physical Exam: Physical Exam Vitals and nursing note reviewed.  Constitutional:      General: She is not in acute distress.    Appearance: Normal appearance. She is normal weight. She is ill-appearing. She is not toxic-appearing.  HENT:     Head: Normocephalic and atraumatic.  Eyes:     Pupils: Pupils are equal, round, and reactive to light.  Pulmonary:     Effort: Pulmonary effort is normal.  Musculoskeletal:        General: Normal range of motion.  Neurological:     General: No focal deficit present.      Mental Status: She is alert and oriented to person, place,  and time.    Review of Systems  Constitutional: Negative.   HENT: Negative.    Eyes: Negative.   Respiratory:  Negative for cough and shortness of breath.   Cardiovascular:  Negative for chest pain.  Gastrointestinal:  Negative for abdominal pain, constipation, diarrhea, nausea and vomiting.  Skin: Negative.   Neurological:  Positive for weakness (mild). Negative for dizziness and headaches (mild).  Psychiatric/Behavioral:  Positive for depression. Negative for hallucinations, substance abuse and suicidal ideas (passive SI). The patient is nervous/anxious and has insomnia.    Blood pressure (!) 143/98, pulse (!) 118, temperature 98 F (36.7 C), temperature source Oral, resp. rate 16, height 5\' 1"  (1.549 m), SpO2 97 %. Body mass index is 18.89 kg/m.  Treatment Plan Summary: Daily contact with patient to assess and evaluate symptoms and progress in treatment and Medication management PLAN Safety and Monitoring: Involuntary admission to inpatient psychiatric unit for safety, stabilization and treatment Daily contact with patient to assess and evaluate symptoms and progress in treatment Patient's case to be discussed in multi-disciplinary team meeting Observation Level : q15 minute checks Vital signs: q12 hours Precautions:Safety  MDD, Recurrent, Severe, w/Psychosis: -Continue Ritalin 5 mg daily for energy stimulation -Previously discontinued Remeron 45 mg QHS for depression -Continue Zoloft 100 mg for depressive symptoms -Continue Haldol 5 mg BID for psychosis -Previously discontinued Zyprexa to 15 mg d/t lack of efficacy -Continue Xanax 0.5 mg BID PRN for anxiety -Continue Aricept 5 mg daily for memory loss -Continue Agitation Protocol: Zyprexa/Ativan/Geodon  HTN: -Continue Amlodipine to 10 mg daily -Continue Clonidine to 0.1 mg PRN for SBP>160 & DBP>100 -Continue Irbesartan 75 mg daily  GERD -Continue Famotidine 20  mg daily  Constipation -Continue Colace 100 mg daily  Folate Deficiency: -Continue Folic Acid 1 mg daily  Insomnia -Continue Trazodone to 75 mg nightly  Hypokalemia Resolved: -One time dose Kdur 40 mEq -K now 4.1 from 10/17  -Continue Ensure 237 mg BID -Continue PRN's: Tylenol, Maalox, Atarax, Milk of Magnesia  Labs: 10/13- CMP: WNL except for K: 3.0,  BUN < 5,  Total Protein: 6.4,  CBC: WNL except WBC: 11.8,  RBC: 5.27,  Hem: 16.7,  MCHC: 36.5,  Neut Abs: 8.4, Mono Abs: 1.1, Lipid Panel: WNL except Trig: 193,  A1c: 4.8, TSH: 1.902,  UDS: Benzo pos,  EtOH: Neg,  EKG: Sinus Tach with Qtc: 439 10/14- UA: WNL with exception of straw color  10/15-  RPR: Neg,  Folate: 5.2,  Ceruloplasmin: 15.6,  HIV nonreactive  11/15, MD 04/24/2022, 2:49 PM

## 2022-04-24 NOTE — Progress Notes (Signed)
   04/24/22 0900  Psych Admission Type (Psych Patients Only)  Admission Status Involuntary  Psychosocial Assessment  Patient Complaints Anxiety;Appetite decrease;Depression  Eye Contact Fair  Facial Expression Anxious;Worried;Sad  Affect Appropriate to circumstance  Speech Logical/coherent  Interaction Forwards little;Minimal  Motor Activity Tremors  Appearance/Hygiene Disheveled  Behavior Characteristics Cooperative;Appropriate to situation  Mood Depressed;Anxious  Aggressive Behavior  Effect No apparent injury  Thought Process  Coherency WDL  Content WDL  Delusions None reported or observed  Perception WDL  Hallucination None reported or observed  Judgment WDL  Confusion None  Danger to Self  Current suicidal ideation? Denies  Self-Injurious Behavior No self-injurious ideation or behavior indicators observed or expressed   Agreement Not to Harm Self Yes  Description of Agreement Verbal  Danger to Others  Danger to Others None reported or observed  Danger to Others Abnormal  Harmful Behavior to others No threats or harm toward other people  Destructive Behavior No threats or harm toward property

## 2022-04-25 DIAGNOSIS — F323 Major depressive disorder, single episode, severe with psychotic features: Secondary | ICD-10-CM | POA: Diagnosis not present

## 2022-04-25 MED ORDER — PROPRANOLOL HCL 10 MG PO TABS
10.0000 mg | ORAL_TABLET | Freq: Two times a day (BID) | ORAL | Status: DC
  Administered 2022-04-25 – 2022-04-28 (×7): 10 mg via ORAL
  Filled 2022-04-25 (×9): qty 1

## 2022-04-25 MED ORDER — SENNOSIDES-DOCUSATE SODIUM 8.6-50 MG PO TABS
1.0000 | ORAL_TABLET | Freq: Every day | ORAL | Status: DC
  Administered 2022-04-25 – 2022-04-27 (×3): 1 via ORAL
  Filled 2022-04-25 (×5): qty 1

## 2022-04-25 NOTE — Progress Notes (Signed)
[facility] MD Progress Note  04/25/2022 1:27 PM KEIRSTYN AYDT  MRN:  [id number]  HPI:  Marie Diaz is a 56 yr old female who presented to [facility] on 10/13 due to depression, SI, and command auditory hallucinations, she was admitted to [facility] on 10/14.  PPHx is significant for Anxiety and no history of Suicide Attempts, Self Injurious Behavior, or Psychiatric Hospitalizations.   24 hr chart review: Blood pressures are better controlled, but heart rate continues to have periods of elevations. Pt continuing to be compliant with all scheduled medications. PRN medications taken in the past 24 hrs are: Xanax 0.5 mg last night, Cogentin 0.5 mg earlier today morning, and Hydroxyzine 25 mg last night for anxiety. Pt has made some progress over the past 24 hrs, and has attended some group sessions.   Today's nursing assessment note: Today, patient reports that her mood has improved over the past couple of days. She denies suicidal ideations, she denies homicidal ideations, denies denies auditory or visual hallucinations and denies paranoia. There is no evidence of delusional thinking. She presents with a depressed mood, but looks brighter than at admission and in previous days. Her attention to personal hygiene and grooming is fair, which is an improvement from the past couple of days. Eye contact is good, speech is clear & coherent. Thought contents are organized and logical.  Pt reports that her sleep quality last night was good, she reports a good appetite, and denies being in any physical distress today. Pt's only complaint today is constipations. She states that she has not had a bowel movement in 3 days. She is agreeable to senna-colace being ordered for constipation. Pt observed to have tremors and psychomotor agitation when seated, but states that this is the way she presents when anxious. She reports that this was existent prior to admission, and is not medication related. Pt rates both anxiety and depression  today as 5 (10 being worst). This is progress as compared to the days prior.  Adding Inderal 10 mg BID to pt's medication regimen to help with anxiety and tachycardia. Adding senna-colace for constipation and continuing all other medications as listed below. Initially, plan was to discharge pt on today, but that plan changed because safety planning has not been completed for pt yet. Pt is at high risk for danger to self given that she does not have adequate social support, and is of low socioeconomic status. Medication adjustments were also being completed, and are still being completed, which requires additional days of hospitalization to monitor for side effects, etc. We are planning to discharge pt on 04/28/2022.  Principal Problem: Major depressive disorder, single episode with psychotic features (HCC) Diagnosis: Principal Problem:   Major depressive disorder, single episode with psychotic features (HCC)  Total Time spent with patient:  I personally spent 30 minutes on the unit in direct patient care. The direct patient care time included face-to-face time with the patient, reviewing the patient's chart, communicating with other professionals, and coordinating care. Greater than 50% of this time was spent in counseling or coordinating care with the patient regarding goals of hospitalization, psycho-education, and discharge planning needs.  Past Psychiatric History: Anxiety and no history of Suicide Attempts, Self Injurious Behavior, or Psychiatric Hospitalizations.   Past Medical History:  Past Medical History:  Diagnosis Date   Anxiety    Depression    Gastric ulcer    Gastritis    Hypertension     Past Surgical History:  Procedure Laterality Date  ABDOMINAL HYSTERECTOMY     BIOPSY  08/29/2019   Procedure: BIOPSY;  Surgeon: West Bali, MD;  Location: [facility] ENDO SUITE;  Service: Endoscopy;;   BIOPSY  02/26/2021   Procedure: BIOPSY;  Surgeon: Lanelle Bal, DO;  Location: [facility]  ENDO SUITE;  Service: Endoscopy;;   CHOLECYSTECTOMY     ESOPHAGOGASTRODUODENOSCOPY N/A 08/29/2019   normal esophagus, localized moderate inflammation with adherent blood, congestion, erosions, and friability on greater curvature of stomach. Biopsy with chronic gastritis, negative H.pylori.    ESOPHAGOGASTRODUODENOSCOPY (EGD) WITH PROPOFOL N/A 02/26/2021   gastritis and pathology with reactive gastropathy and negative H.pylori   HEMORRHOID SURGERY     Family History:  Family History  Problem Relation Age of Onset   Colon cancer Neg Hx    Colon polyps Neg Hx    Family Psychiatric  History: Reports None Social History:  Social History   Substance and Sexual Activity  Alcohol Use No     Social History   Substance and Sexual Activity  Drug Use No    Social History   Socioeconomic History   Marital status: Married    Spouse name: Not on file   Number of children: Not on file   Years of education: Not on file   Highest education level: Not on file  Occupational History   Not on file  Tobacco Use   Smoking status: Every Day    Packs/day: 0.50    Types: Cigarettes   Smokeless tobacco: Never  Vaping Use   Vaping Use: Former  Substance and Sexual Activity   Alcohol use: No   Drug use: No   Sexual activity: Yes  Other Topics Concern   Not on file  Social History Narrative   [phone]), kids(2). 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 SHE'S AN ONLY CHILD.   Social Determinants of Health   Financial Resource Strain: Not on file  Food Insecurity: Not on file  Transportation Needs: Not on file  Physical Activity: Not on file  Stress: Not on file  Social Connections: Not on file   Additional Social History:     Sleep: Poor  Appetite:  Fair  Current Medications: Current Facility-Administered Medications  Medication Dose Route Frequency Provider Last Rate Last Admin   acetaminophen (TYLENOL) tablet 650 mg  650 mg Oral  Q6H PRN Lauree Chandler, NP       ALPRAZolam Prudy Feeler) tablet 0.5 mg  0.5 mg Oral BID PRN Thalia Party, MD   0.5 mg at 04/24/22 1632   alum & mag hydroxide-simeth (MAALOX/MYLANTA) 200-200-20 MG/5ML suspension 30 mL  30 mL Oral Q4H PRN Lauree Chandler, NP   30 mL at 04/23/22 2110   amLODipine (NORVASC) tablet 10 mg  10 mg Oral Daily Starleen Blue, NP   10 mg at 04/25/22 2440   benztropine (COGENTIN) tablet 0.5 mg  0.5 mg Oral BID PRN Leevy-Johnson, Brooke A, NP   0.5 mg at 04/25/22 0810   cloNIDine (CATAPRES) tablet 0.1 mg  0.1 mg Oral BID PRN Starleen Blue, NP       donepezil (ARICEPT) tablet 5 mg  5 mg Oral QHS Attiah, Nadir, MD   5 mg at 04/24/22 2014   famotidine (PEPCID) tablet 20 mg  20 mg Oral Daily Mckala Pantaleon, NP   20 mg at 04/25/22 0808   feeding supplement (ENSURE ENLIVE / ENSURE PLUS) liquid 237 mL  237 mL Oral BID BM Darrick Grinder  Eun, NP   237 mL at 04/24/22 1404   folic acid (FOLVITE) tablet 1 mg  1 mg Oral Daily Paliy, Serina Cowper, MD   1 mg at 04/25/22 6503   haloperidol (HALDOL) tablet 5 mg  5 mg Oral BID Leevy-Johnson, Brooke A, NP   5 mg at 04/25/22 5465   hydrOXYzine (ATARAX) tablet 25 mg  25 mg Oral TID PRN Starleen Blue, NP   25 mg at 04/24/22 2014   irbesartan (AVAPRO) tablet 75 mg  75 mg Oral Daily Buckley Bradly, Tyler Aas, NP   75 mg at 04/25/22 6812   magnesium hydroxide (MILK OF MAGNESIA) suspension 30 mL  30 mL Oral Daily PRN Lauree Chandler, NP       methylphenidate (RITALIN) tablet 5 mg  5 mg Oral Daily Danaye Sobh, Tyler Aas, NP   5 mg at 04/25/22 0848   OLANZapine zydis (ZYPREXA) disintegrating tablet 5 mg  5 mg Oral Q8H PRN Lauree Chandler, NP   5 mg at 04/16/22 2351   propranolol (INDERAL) tablet 10 mg  10 mg Oral BID Starleen Blue, NP       senna-docusate (Senokot-S) tablet 1 tablet  1 tablet Oral QHS Orly Quimby, NP       sertraline (ZOLOFT) tablet 100 mg  100 mg Oral Daily Sachit Gilman, NP   100 mg at 04/25/22 7517   traZODone (DESYREL) tablet 100 mg  100  mg Oral QHS Starleen Blue, NP   100 mg at 04/24/22 2014    Lab Results:  No results found for this or any previous visit (from the past 48 hour(s)).   Blood Alcohol level:  Lab Results  Component Value Date   ETH <10 04/11/2022   ETH <10 06/11/2019    Metabolic Disorder Labs: Lab Results  Component Value Date   HGBA1C 4.8 04/11/2022   MPG 91.06 04/11/2022   No results found for: "PROLACTIN" Lab Results  Component Value Date   CHOL 177 04/11/2022   TRIG 193 (H) 04/11/2022   HDL 41 04/11/2022   CHOLHDL 4.3 04/11/2022   VLDL 39 04/11/2022   LDLCALC 97 04/11/2022    Physical Findings: AIMS: 0 CIWA:  n/a COWS: n/a    Musculoskeletal: Strength & Muscle Tone: decreased Gait & Station: normal Patient leans: N/A  Psychiatric Specialty Exam:  Presentation  General Appearance:  Appropriate for Environment; Fairly Groomed  Eye Contact: Good  Speech: Clear and Coherent  Speech Volume: Normal  Handedness:Right   Mood and Affect  Mood: Depressed  Affect: Congruent  Thought Process  Thought Processes: Coherent  Descriptions of Associations:Intact  Orientation:Full (Time, Place and Person)  Thought Content:Logical  History of Schizophrenia/Schizoaffective disorder:No  Duration of Psychotic Symptoms:Less than six months  Hallucinations:Hallucinations: None  Ideas of Reference:None  Suicidal Thoughts:Suicidal Thoughts: No  Homicidal Thoughts:Homicidal Thoughts: No  Sensorium  Memory: Immediate Good  Judgment: Fair  Insight: Fair   Art therapist  Concentration: Fair  Attention Span: Good  Recall: Fair  Fund of Knowledge: Good  Language: Good  Psychomotor Activity  Psychomotor Activity:Psychomotor Activity: Tremor  Assets  Assets: Communication Skills; Resilience  Sleep  Sleep:Sleep: Good  Physical Exam: Physical Exam Vitals and nursing note reviewed.  Constitutional:      General: She is not in acute  distress.    Appearance: Normal appearance. She is normal weight. She is ill-appearing. She is not toxic-appearing.  HENT:     Head: Normocephalic and atraumatic.  Eyes:     Pupils: Pupils are equal, round, and  reactive to light.  Pulmonary:     Effort: Pulmonary effort is normal.  Musculoskeletal:        General: Normal range of motion.  Neurological:     General: No focal deficit present.     Mental Status: She is alert and oriented to person, place, and time.    Review of Systems  Constitutional: Negative.   HENT: Negative.    Eyes: Negative.   Respiratory:  Negative for cough and shortness of breath.   Cardiovascular:  Negative for chest pain.  Gastrointestinal:  Negative for abdominal pain, constipation, diarrhea, nausea and vomiting.  Skin: Negative.   Neurological:  Positive for weakness (mild). Negative for dizziness and headaches (mild).  Psychiatric/Behavioral:  Positive for depression (resolving) and memory loss. Negative for hallucinations, substance abuse and suicidal ideas (passive SI). The patient is nervous/anxious. The patient does not have insomnia.    Blood pressure (!) 125/91, pulse (!) 118, temperature 98.4 F (36.9 C), temperature source Oral, resp. rate 16, height 5\' 1"  (1.549 m), SpO2 97 %. Body mass index is 18.89 kg/m.  Treatment Plan Summary: Daily contact with patient to assess and evaluate symptoms and progress in treatment and Medication management PLAN Safety and Monitoring: Involuntary admission to inpatient psychiatric unit for safety, stabilization and treatment Daily contact with patient to assess and evaluate symptoms and progress in treatment Patient's case to be discussed in multi-disciplinary team meeting Observation Level : q15 minute checks Vital signs: q12 hours Precautions:Safety  MDD, Recurrent, Severe, w/Psychosis: -Continue Ritalin 5 mg daily for energy stimulation -Previously discontinued Remeron 45 mg QHS for  depression -Continue Zoloft 100 mg for depressive symptoms -Continue Haldol 5 mg BID for psychosis -Previously discontinued Zyprexa to 15 mg d/t lack of efficacy -Continue Xanax 0.5 mg BID PRN for anxiety -Continue Aricept 5 mg daily for memory loss -Continue Agitation Protocol: Zyprexa/Ativan/Geodon  HTN: -Continue Amlodipine to 10 mg daily -Continue Clonidine to 0.1 mg PRN for SBP>160 & DBP>100 -Continue Irbesartan 75 mg daily -Start Inderal 10 mg BID for tachycardia  GERD -Continue Famotidine 20 mg daily  Constipation -Start Senna-Colace daily -Discontinue Colace 100 mg daily  Folate Deficiency: -Continue Folic Acid 1 mg daily  Insomnia -Continue Trazodone to 75 mg nightly  Hypokalemia Resolved: -One time dose Kdur 40 mEq -K now 4.1 from 10/17  -Continue Ensure 237 mg BID -Continue PRN's: Tylenol, Maalox, Atarax, Milk of Magnesia  Labs: 10/13- CMP: WNL except for K: 3.0,  BUN < 5,  Total Protein: 6.4,  CBC: WNL except WBC: 11.8,  RBC: 5.27,  Hem: 16.7,  MCHC: 36.5,  Neut Abs: 8.4, Mono Abs: 1.1, Lipid Panel: WNL except Trig: 193,  A1c: 4.8, TSH: 1.902,  UDS: Benzo pos,  EtOH: Neg,  EKG: Sinus Tach with Qtc: 439 10/14- UA: WNL with exception of straw color  10/15-  RPR: Neg,  Folate: 5.2,  Ceruloplasmin: 15.6,  HIV nonreactive  Nicholes Rough, NP 04/25/2022, 1:27 PMPatient ID: Kathlene Cote, female   DOB: June 24, 1966, 56 y.o. Patient ID: Kathlene Cote, female  Patient ID: Kathlene Cote, female

## 2022-04-25 NOTE — Progress Notes (Signed)
Did not attend wrap up group

## 2022-04-25 NOTE — Progress Notes (Addendum)
Pt received PRN Cogentin 0.5 mg for tremors and Vistaril 25 mg anxiety both PO with desired effect. Observed with improvement in tremors and anxiety in comparison to previous days. Reports she slept well but appetite remains poor. Cooperative with care when prompted. Denies SI, HI, AVH and pain when assessed. Affect is flat with logical speech, she's guarded but brightens up on interactions. Declined lunch but drank 2 ensures, fluids (juice and water) ate 50% of dinner, tolerated all well (I & O) sheet. Continued support, encouragement and reassurance provided to pt. Safety checks maintained at Q 15 minutes intervals on and off unit unit without issues.

## 2022-04-25 NOTE — Progress Notes (Signed)
No acute distress overnight. Medication compliant.    04/24/22 2100  Psych Admission Type (Psych Patients Only)  Admission Status Involuntary  Psychosocial Assessment  Patient Complaints Anxiety;Depression  Eye Contact Fair  Facial Expression Anxious  Affect Appropriate to circumstance  Speech Logical/coherent  Interaction Minimal  Motor Activity Slow;Tremors  Appearance/Hygiene Disheveled  Behavior Characteristics Cooperative  Mood Anxious  Thought Process  Coherency WDL  Content WDL  Delusions None reported or observed  Perception WDL  Hallucination None reported or observed  Judgment WDL  Confusion None  Danger to Self  Current suicidal ideation?  (Denies)  Agreement Not to Harm Self Yes  Description of Agreement Verbal  Danger to Others  Danger to Others None reported or observed  Danger to Others Abnormal  Harmful Behavior to others No threats or harm toward other people  Destructive Behavior No threats or harm toward property

## 2022-04-25 NOTE — [facility] Group Notes (Signed)
Spirituality group facilitated by Simone Curia, MDiv, B[facility].  Group Description:  Group focused on topic of hope.  Patients participated in facilitated discussion around topic, connecting with one another around experiences and definitions for hope.  Group members engaged with visual explorer photos, reflecting on what hope looks like for them today.  Group engaged in discussion around how their definitions of hope are present today in hospital.   Modalities: Psycho-social ed, Adlerian, Narrative, MI Patient Progress: Pam was present throughout group.  Shared name and acknowledged facilitator at beginning of group.  Minimally participatory throughout discussion

## 2022-04-25 NOTE — Group Note (Signed)
LCSW Group Therapy  Type of Therapy and Topic:  Group Therapy: Thoughts, Feelings, and Actions  Participation Level:  Minimal   Description of Group:   In this group, each patient discussed their previous experiencing and understanding of overthinking, identifying the harmful impact on their lives. As a group, each patient was introduced to the basic concepts of Cognitive Behavioral Therapy: that thoughts, feelings, and actions are all connected and influence one another. They were given examples of how overthinking can affect our feelings, actions, and vise versa. The group was then asked to analyze how overthinking was harmful and brainstorm alternative thinking patterns/reactions to the example situation. Then, each group member filled out and identified their own example situation in which a problem situation caused their thoughts, feelings, and actions to be negatively impacted; they were asked to come up with 3 new (more adaptive/positive) thoughts that led to 3 new feelings and actions.  Therapeutic Goals: Patients will review and discuss their past experience with overthinking. Patients will learn the basics of the CBT model through group-led examples.. Patients will identify situations where they may have negative thoughts, feelings, or actions and will then reframe the situation using more positive thoughts to react differently.  Summary of Patient Progress:  The patient shared that they are strong. Patient contributed to the discussion of how thoughts, feelings, and actions interact, noting when they may have experienced a negative thought pattern and recognized it as harmful. They were attentive when other patients shared their experiences, and worked to reframe their own thoughts in an activity to identify future situations where they may typically overthink.  Patient seemed anxious and shook leg throughout the group.  Patient participated minimally and had a hard time engaging with peers  and CSW when asked questions directly.  Therapeutic Modalities:   Cognitive Behavioral Therapy Mindfulness  Zachery Conch, [place] 04/25/2022  11:39 AM

## 2022-04-26 DIAGNOSIS — F323 Major depressive disorder, single episode, severe with psychotic features: Secondary | ICD-10-CM | POA: Diagnosis not present

## 2022-04-26 MED ORDER — BENZTROPINE MESYLATE 0.5 MG PO TABS
0.5000 mg | ORAL_TABLET | Freq: Every day | ORAL | Status: DC | PRN
  Administered 2022-04-26 – 2022-04-28 (×2): 0.5 mg via ORAL
  Filled 2022-04-26 (×2): qty 1
  Filled 2022-04-26: qty 7

## 2022-04-26 MED ORDER — HALOPERIDOL 5 MG PO TABS
10.0000 mg | ORAL_TABLET | Freq: Every day | ORAL | Status: DC
  Administered 2022-04-27: 10 mg via ORAL
  Filled 2022-04-26 (×2): qty 2

## 2022-04-26 NOTE — Group Note (Signed)
  [facility] LCSW Group Therapy Note  Date/Time:  04/26/2022 11:15AM-12:00PM  Type of Therapy and Topic:  Group Therapy:  Facing Change at Discharge  Participation Level:  Minimal   Description of Group This process group involved patients discussing what actions they need to take to continue to stay well at discharge.  These necessary actions were discussed in detail, including why they are important and methods to be used to ensure they are continued.  The group brainstormed together other possible actions that would benefit patients by being continued after discharge.  Therapeutic Goals Patients will identify and describe actions that have been helpful in the hospital that they wish to continue after discharge Patients will verbalize benefits of these actions Patients will describe specifically how they plan to keep these habits active Patients will brainstorm other necessary actions that can produce positive benefits in the outpatient setting  Summary of Patient Progress:  The patient expressed one thing she can continue after hospital discharge that will help her stay well is "stay positive."  The way in which this can be continued is to look in the mirror daily and tell herself, "You got this, girl."  The first thing the patient is looking forward to upon discharge is to go home and organize -- when asked for more details, she declined to comment, stating that it was personal.  She remained flat and depressed while talking about staying positive..  Therapeutic Modalities Cognitive Behavioral Therapy Motivational Interviewing    Selmer Dominion, LCSW 04/26/2022, 5:10 PM

## 2022-04-26 NOTE — Progress Notes (Signed)
   04/25/22 2200  Psych Admission Type (Psych Patients Only)  Admission Status Involuntary  Psychosocial Assessment  Patient Complaints Anxiety  Eye Contact Fair  Facial Expression Anxious;Animated  Affect Appropriate to circumstance  Speech Logical/coherent  Interaction Assertive  Motor Activity Slow;Tremors  Appearance/Hygiene Unremarkable  Behavior Characteristics Cooperative;Appropriate to situation  Mood Anxious  Thought Process  Coherency WDL  Content WDL  Delusions None reported or observed  Perception WDL  Hallucination None reported or observed  Judgment WDL  Confusion None  Danger to Self  Current suicidal ideation? Denies  Self-Injurious Behavior No self-injurious ideation or behavior indicators observed or expressed   Agreement Not to Harm Self Yes  Description of Agreement verbal  Danger to Others  Danger to Others None reported or observed  Danger to Others Abnormal  Harmful Behavior to others No threats or harm toward other people  Destructive Behavior No threats or harm toward property

## 2022-04-26 NOTE — Progress Notes (Signed)
[facility] MD Progress Note  04/26/2022 1:38 PM Marie Diaz  MRN:  [id number]  HPI:  Marie Diaz is a 56 yr old female who [facility] due to depression, SI, and command auditory hallucinations, she was admitted to [facility] on 10/14.  PPHx is significant for Anxiety and no history of Suicide Attempts, Self Injurious Behavior, or Psychiatric Hospitalizations.   24 hr chart review: Blood pressures have been WNL for the past 24 hrs. Pt is continuing to be compliant with all scheduled medications. PRN medications taken in the past 24 hrs are: Xanax 0.5 mg last night, and Hydroxyzine 25 mg last night for anxiety. Pt is continuing to make progress, and over the past 24 hrs, she has attended some group sessions. As per nursing documentation, she slept for 7.5 hrs last night. No behavioral episodes noted in the past 24 hrs.  Today's nursing assessment note: Today, patient reports that her mood is continuing to improve. She is continuing to deny suicidal ideations, she denies homicidal ideations, denies auditory or visual hallucinations and denies paranoia. There is no evidence of delusional thinking. Mood continues to be depressed, affect is congruent, but there is an improvement in overall presentation, as compared to last week, and at admission. Her attention to personal hygiene and grooming is fair, which is also an improvement from last week and at admission. Eye contact is good, speech is clear & coherent. Thought contents are organized and logical.  Marie Diaz reports that her sleep quality last night was good, she reports a good appetite, and denies being in any physical distress today. Pt's only complaint today remains constipation. She states that she has not had a bowel movement in 4 days, and that the Senna-Colace was not helpful. She is agreeable to a one time order for Magnesium Citrate for constipation. Pt  continues to be observed to have tremors and psychomotor agitation when seated,  but states that this is the way she presents when anxious. She continues to report that this was existent prior to admission, and is not medication related. Pt has rated both anxiety and depression today as 5 (10 being worst). This is same as yesterday, and is an improvement as compared to last week.  Medication adjustments for today are as follows; Discontinuing Clonidine PRN as blood pressure has stabilized. Discontinuing Cogentin as this might be contributing to constipation. Discontinuing Ritalin as this might be contributing to psychomotor agitation and anxiety. Haldol moved to 10 mg nightly due to day time tiredness. Continuing other medications as listed below. Tentative plan is to discharge pt on Monday 04/28/22, pending outpatient f/u appointments being completed and safety planning with pt's family being completed.   Principal Problem: Major depressive disorder, single episode with psychotic features ([place]) Diagnosis: Principal Problem:   Major depressive disorder, single episode with psychotic features ([place])  Total Time spent with patient:  I personally spent 30 minutes on the unit in direct patient Diaz. The direct patient Diaz time included face-to-face time with the patient, reviewing the patient's chart, communicating with other professionals, and coordinating Diaz. Greater than 50% of this time was spent in counseling or coordinating Diaz with the patient regarding goals of hospitalization, psycho-education, and discharge planning needs.  Past Psychiatric History: Anxiety and no history of Suicide Attempts, Self Injurious Behavior, or Psychiatric Hospitalizations.   Past Medical History:  Past Medical History:  Diagnosis Date   Anxiety    Depression    Gastric ulcer    Gastritis  Hypertension     Past Surgical History:  Procedure Laterality Date   ABDOMINAL HYSTERECTOMY     BIOPSY  08/29/2019   Procedure: BIOPSY;  Surgeon: Danie Binder, MD;  Location: [facility];   Service: Endoscopy;;   BIOPSY  02/26/2021   Procedure: BIOPSY;  Surgeon: Eloise Harman, DO;  Location: [facility];  Service: Endoscopy;;   CHOLECYSTECTOMY     ESOPHAGOGASTRODUODENOSCOPY N/A 08/29/2019   normal esophagus, localized moderate inflammation with adherent blood, congestion, erosions, and friability on greater curvature of stomach. Biopsy with chronic gastritis, negative H.pylori.    ESOPHAGOGASTRODUODENOSCOPY (EGD) WITH PROPOFOL N/A 02/26/2021   gastritis and pathology with reactive gastropathy and negative H.pylori   HEMORRHOID SURGERY     Family History:  Family History  Problem Relation Age of Onset   Colon cancer Neg Hx    Colon polyps Neg Hx    Family Psychiatric  History: Reports None Social History:  Social History   Substance and Sexual Activity  Alcohol Use No     Social History   Substance and Sexual Activity  Drug Use No    Social History   Socioeconomic History   Marital status: Married    Spouse name: Not on file   Number of children: Not on file   Years of education: Not on file   Highest education level: Not on file  Occupational History   Not on file  Tobacco Use   Smoking status: Every Day    Packs/day: 0.50    Types: Cigarettes   Smokeless tobacco: Never  Vaping Use   Vaping Use: Former  Substance and Sexual Activity   Alcohol use: No   Drug use: No   Sexual activity: Yes  Other Topics Concern   Not on file  Social History Narrative   [phone]), kids(2). 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 DOESN'T HAVE TIME FOR SELF Diaz. SHE'S AN ONLY CHILD.   Social Determinants of Health   Financial Resource Strain: Not on file  Food Insecurity: Not on file  Transportation Needs: Not on file  Physical Activity: Not on file  Stress: Not on file  Social Connections: Not on file   Additional Social History:     Sleep: Poor  Appetite:  Fair  Current Medications: Current Facility-Administered Medications   Medication Dose Route Frequency Provider Last Rate Last Admin   acetaminophen (TYLENOL) tablet 650 mg  650 mg Oral Q6H PRN Tharon Aquas, NP       ALPRAZolam Duanne Moron) tablet 0.5 mg  0.5 mg Oral BID PRN Larita Fife, MD   0.5 mg at 04/25/22 2121   alum & mag hydroxide-simeth (MAALOX/MYLANTA) 200-200-20 MG/5ML suspension 30 mL  30 mL Oral Q4H PRN Tharon Aquas, NP   30 mL at 04/23/22 2110   amLODipine (NORVASC) tablet 10 mg  10 mg Oral Daily Nicholes Rough, NP   10 mg at 04/26/22 [id number]   benztropine (COGENTIN) tablet 0.5 mg  0.5 mg Oral Daily PRN Nicholes Rough, NP       donepezil (ARICEPT) tablet 5 mg  5 mg Oral QHS Attiah, Nadir, MD   5 mg at 04/25/22 2120   famotidine (PEPCID) tablet 20 mg  20 mg Oral Daily Jenness Stemler, Tamela Oddi, NP   20 mg at 04/26/22 0806   feeding supplement (ENSURE ENLIVE / ENSURE PLUS) liquid 237 mL  237 mL Oral BID BM Tharon Aquas, NP   237 mL at 04/26/22 [phone]  folic acid (FOLVITE) tablet 1 mg  1 mg Oral Daily Paliy, Alisa, MD   1 mg at 04/26/22 0806   [START ON 04/27/2022] haloperidol (HALDOL) tablet 10 mg  10 mg Oral QHS Maeola Mchaney, NP       hydrOXYzine (ATARAX) tablet 25 mg  25 mg Oral TID PRN Nicholes Rough, NP   25 mg at 04/25/22 1403   irbesartan (AVAPRO) tablet 75 mg  75 mg Oral Daily Benjamin Casanas, Tamela Oddi, NP   75 mg at 04/26/22 0805   magnesium hydroxide (MILK OF MAGNESIA) suspension 30 mL  30 mL Oral Daily PRN Tharon Aquas, NP       OLANZapine zydis (ZYPREXA) disintegrating tablet 5 mg  5 mg Oral Q8H PRN Tharon Aquas, NP   5 mg at 04/16/22 2351   propranolol (INDERAL) tablet 10 mg  10 mg Oral BID Nicholes Rough, NP   10 mg at 04/26/22 0805   senna-docusate (Senokot-S) tablet 1 tablet  1 tablet Oral QHS Seini Lannom, Tamela Oddi, NP   1 tablet at 04/25/22 1414   sertraline (ZOLOFT) tablet 100 mg  100 mg Oral Daily Nicholes Rough, NP   100 mg at 04/26/22 [id number]   traZODone (DESYREL) tablet 100 mg  100 mg Oral QHS Nicholes Rough, NP   100 mg at 04/25/22  2120    Lab Results:  No results found for this or any previous visit (from the past 25 hour(s)).   Blood Alcohol level:  Lab Results  Component Value Date   ETH <10 04/11/2022   ETH <10 [id number]    Metabolic Disorder Labs: Lab Results  Component Value Date   HGBA1C 4.8 04/11/2022   MPG 91.06 04/11/2022   No results found for: "PROLACTIN" Lab Results  Component Value Date   CHOL 177 04/11/2022   TRIG 193 (H) 04/11/2022   HDL 41 04/11/2022   CHOLHDL 4.3 04/11/2022   VLDL 39 04/11/2022   LDLCALC 97 04/11/2022   Physical Findings: AIMS: 0 CIWA:  n/a COWS: n/a   Musculoskeletal: Strength & Muscle Tone: decreased Gait & Station: normal Patient leans: N/A  Psychiatric Specialty Exam:  Presentation  General Appearance:  Appropriate for Environment; Fairly Groomed  Eye Contact: Good  Speech: Clear and Coherent  Speech Volume: Normal  Handedness:Right  Mood and Affect  Mood: Depressed  Affect: Congruent  Thought Process  Thought Processes: Coherent  Descriptions of Associations:Intact  Orientation:Full (Time, Place and Person)  Thought Content:Logical  History of Schizophrenia/Schizoaffective disorder:No  Duration of Psychotic Symptoms:Less than six months  Hallucinations:Hallucinations: None  Ideas of Reference:None  Suicidal Thoughts:Suicidal Thoughts: No  Homicidal Thoughts:Homicidal Thoughts: No  Sensorium  Memory: Immediate Good  Judgment: Fair  Insight: Fair  Community education officer  Concentration: Good  Attention Span: Good  Recall: Good  Fund of Knowledge: Good  Language: Good  Psychomotor Activity  Psychomotor Activity:Psychomotor Activity: Tremor  Assets  Assets: Resilience  Sleep  Sleep:Sleep: Good  Physical Exam: Physical Exam Vitals and nursing note reviewed.  Constitutional:      General: She is not in acute distress.    Appearance: Normal appearance. She is normal weight. She is  ill-appearing. She is not toxic-appearing.  HENT:     Head: Normocephalic and atraumatic.  Eyes:     Pupils: Pupils are equal, round, and reactive to light.  Pulmonary:     Effort: Pulmonary effort is normal.  Musculoskeletal:        General: Normal range of motion.  Neurological:  General: No focal deficit present.     Mental Status: She is alert and oriented to person, place, and time.    Review of Systems  Constitutional: Negative.   HENT: Negative.    Eyes: Negative.   Respiratory:  Negative for cough and shortness of breath.   Cardiovascular:  Negative for chest pain.  Gastrointestinal:  Negative for abdominal pain, constipation, diarrhea, nausea and vomiting.  Skin: Negative.   Neurological:  Positive for weakness (mild). Negative for dizziness and headaches (mild).  Psychiatric/Behavioral:  Positive for depression (resolving) and memory loss. Negative for hallucinations, substance abuse and suicidal ideas (passive SI). The patient is nervous/anxious. The patient does not have insomnia.    Blood pressure 110/89, pulse 100, temperature 98.2 F (36.8 C), temperature source Oral, resp. rate 16, height 5\' 1"  (1.549 m), SpO2 97 %. Body mass index is 18.89 kg/m.  Treatment Plan Summary: Daily contact with patient to assess and evaluate symptoms and progress in treatment and Medication management PLAN Safety and Monitoring: Involuntary admission to inpatient psychiatric unit for safety, stabilization and treatment Daily contact with patient to assess and evaluate symptoms and progress in treatment Patient's case to be discussed in multi-disciplinary team meeting Observation Level : q15 minute checks Vital signs: q12 hours Precautions:Safety  MDD, Recurrent, Severe, w/Psychosis: -Discontinue Ritalin 5 mg daily as might be worsening psychomotor agitation and anxiety -Change Cogentin to 0.5 mg PRN daily as might be worsening constipation -Previously discontinued Remeron 45  mg QHS for depression -Continue Zoloft 100 mg for depressive symptoms -Continue Haldol 5 mg BID for psychosis -Previously discontinued Zyprexa to 15 mg d/t lack of efficacy -Continue Xanax 0.5 mg BID PRN for anxiety -Continue Aricept 5 mg daily for memory loss -Continue Agitation Protocol: Zyprexa/Ativan/Geodon  HTN: -Continue Amlodipine to 10 mg daily -Discontinue Clonidine 0.1 mg PRN -Continue Irbesartan 75 mg daily -Continue Inderal 10 mg BID for tachycardia & anxiety  GERD -Continue Famotidine 20 mg daily  Constipation -Give Magnesium Citrate x 1 dose -Continue Senna-Colace daily -Discontinue Colace 100 mg daily  Folate Deficiency: -Continue Folic Acid 1 mg daily  Insomnia -Continue Trazodone to 75 mg nightly  Hypokalemia Resolved: -One time dose Kdur 40 mEq -K now 4.1 from 10/17  -Continue Ensure 237 mg BID -Continue PRN's: Tylenol, Maalox, Atarax, Milk of Magnesia  Labs: 10/13- CMP: WNL except for K: 3.0,  BUN < 5,  Total Protein: 6.4,  CBC: WNL except WBC: 11.8,  RBC: 5.27,  Hem: 16.7,  MCHC: 36.5,  Neut Abs: 8.4, Mono Abs: 1.1, Lipid Panel: WNL except Trig: 193,  A1c: 4.8, TSH: 1.902,  UDS: Benzo pos,  EtOH: Neg,  EKG: Sinus Tach with Qtc: 439 10/14- UA: WNL with exception of straw color  10/15-  RPR: Neg,  Folate: 5.2,  Ceruloplasmin: 15.6,  HIV nonreactive  Nicholes Rough, NP 04/26/2022, 1:38 PMPatient ID: Marie Diaz, female   DOB: 11-11-1965, 56 y.o. Patient ID: Marie Diaz, female  Patient ID: Marie Diaz, femalePatient ID: Marie Diaz, female   DOB: Jul 27, 1965, 56 y.o.   MRN: [id number]

## 2022-04-26 NOTE — [facility] Group Notes (Signed)
Goals Group 04/26/22   Group Focus: affirmation, clarity of thought, and goals/reality orientation Treatment Modality:  Psychoeducation Interventions utilized were assignment, group exercise, and support Purpose: To be able to understand and verbalize the reason for their admission to the hospital. To understand that the medication helps with their chemical imbalance but they also need to work on their choices in life. To be challenged to develop a list of 30 positives about themselves. Also introduce the concept that "feelings" are not reality.  Participation Level:  did not attend  [name] 

## 2022-04-26 NOTE — [facility] Group Notes (Signed)
The focus of this group is to help patients review their daily goal of treatment and discuss progress on daily workbooks.Pt didn't attend wrap up group.  

## 2022-04-27 DIAGNOSIS — F411 Generalized anxiety disorder: Secondary | ICD-10-CM | POA: Insufficient documentation

## 2022-04-27 DIAGNOSIS — K5900 Constipation, unspecified: Secondary | ICD-10-CM | POA: Insufficient documentation

## 2022-04-27 DIAGNOSIS — F323 Major depressive disorder, single episode, severe with psychotic features: Secondary | ICD-10-CM | POA: Diagnosis not present

## 2022-04-27 DIAGNOSIS — G4700 Insomnia, unspecified: Secondary | ICD-10-CM | POA: Insufficient documentation

## 2022-04-27 MED ORDER — FLEET ENEMA 7-19 GM/118ML RE ENEM
1.0000 | ENEMA | Freq: Once | RECTAL | Status: AC
  Administered 2022-04-27: 1 via RECTAL
  Filled 2022-04-27: qty 1

## 2022-04-27 NOTE — Group Note (Signed)
[facility] LCSW Group Therapy Note  Date/Time:  04/27/2022  11:00AM-12:00PM  Type of Therapy and Topic:  Group Therapy:  Music and Mood  Participation Level:  Did Not Attend   Description of Group: In this process group, members listened to a variety of genres of music and identified that different types of music evoke different responses.  Patients were encouraged to identify music that was soothing for them and music that was energizing for them.  Patients discussed how this knowledge can help with wellness and recovery in various ways including managing depression and anxiety as well as encouraging healthy sleep habits.    Therapeutic Goals: Patients will explore the impact of different varieties of music on mood Patients will verbalize the thoughts they have when listening to different types of music Patients will identify music that is soothing to them as well as music that is energizing to them Patients will discuss how to use this knowledge to assist in maintaining wellness and recovery Patients will explore the use of music as a coping skill  Summary of Patient Progress:  Patient was invited to group, did not attend.   Therapeutic Modalities: Solution Focused Brief Therapy Activity   Olliver Boyadjian Grossman-Orr, LCSW   

## 2022-04-27 NOTE — Progress Notes (Signed)
Adult Psychoeducational Group Note  Date:  04/27/2022 Time:  9:20 PM  Group Topic/Focus:  Wrap-Up Group:   The focus of this group is to help patients review their daily goal of treatment and discuss progress on daily workbooks.  Participation Level:  Active  Participation Quality:  Appropriate  Affect:  Appropriate  Cognitive:  Appropriate  Insight: Appropriate  Engagement in Group:  Engaged  Modes of Intervention:  Discussion  Additional Comments:   Pt states that she had a good day and states that she is excited to go home tomorrow. Pt endorses feelings of anxiety and depression.   Marie Diaz 04/27/2022, 9:20 PM

## 2022-04-27 NOTE — [facility] Group Notes (Signed)
Adult Psychoeducational Group Note Date:  04/27/2022 Time:  1100-1130 Group Topic/Focus: PROGRESSIVE RELAXATION. A group where deep breathing is taught and tensing and relaxation muscle groups is used. Imagery is used as well.  Pts are asked to imagine 3 pillars that hold them up when they are not able to hold themselves up and to share that with the group.   Participation Level:  did not attend   Participation Level: Marie Diaz A   

## 2022-04-27 NOTE — Progress Notes (Signed)
   04/27/22 1200  Psych Admission Type (Psych Patients Only)  Admission Status Involuntary  Psychosocial Assessment  Patient Complaints Anxiety;Appetite decrease  Eye Contact Fair  Facial Expression Anxious  Affect Appropriate to circumstance  Speech Logical/coherent  Interaction Assertive  Motor Activity Slow;Tremors  Appearance/Hygiene Unremarkable  Behavior Characteristics Cooperative  Mood Anxious  Aggressive Behavior  Effect No apparent injury  Thought Process  Coherency WDL  Content WDL  Delusions None reported or observed  Perception WDL  Hallucination None reported or observed  Judgment WDL  Confusion None  Danger to Self  Current suicidal ideation? Denies  Self-Injurious Behavior No self-injurious ideation or behavior indicators observed or expressed   Agreement Not to Harm Self Yes  Description of Agreement Verbal  Danger to Others  Danger to Others None reported or observed  Danger to Others Abnormal  Harmful Behavior to others No threats or harm toward other people  Destructive Behavior No threats or harm toward property

## 2022-04-27 NOTE — Progress Notes (Signed)
[facility] MD Progress Note  04/27/2022 11:19 AM Marie Diaz  MRN:  [id number]  HPI:  Marie Diaz is a 56 yr old female who presented to [facility] on 10/13 due to depression, SI, and command auditory hallucinations, she was admitted to [facility] on 10/14.  PPHx is significant for Anxiety and no history of Suicide Attempts, Self Injurious Behavior, or Psychiatric Hospitalizations.   24 hr chart review: Blood pressures have  continued to trend WNL for the past 24 hrs. Pt is continuing to be compliant with all scheduled medications. PRN medications taken in the past 24 hrs are: Xanax 0.5 mg last night, and Hydroxyzine 25 mg last night for anxiety, cogentin 0.5 mg for tremors, and Milk of Magnesia for constipation. Pt is continuing to make progress, and over the past 24 hrs, she is continuing to attend some group sessions. As per nursing documentation, she slept for 7.5 hrs last night. No behavioral episodes noted in the past 24 hrs. Yesterday, adjustments to pt's medications were as follows:  Discontinued Clonidine PRN as blood pressure has stabilized. Changed Cogentin from scheduled to PRN as this might be contributing to constipation. Discontinued Ritalin as this might be contributing to psychomotor agitation, elevations in BP and anxiety. Haldol moved to 10 mg nightly due to day time tiredness.   Today's nursing assessment note: Today, pt is seen in her room on the 500 hall resting in bed. She reports that she is "not feeling too well today". She elaborates that she is experiencing abdominal pain of 7 (10 being worst). She reports that she has not had a bowel movement in 5 days. Pt has active bowel sounds x 4 abdominal quadrants. Fleet enema ordered. Will consider KUB if pt is unable to fully evacuate her bowels after the fleet enema.  Pt is continuing to report that her depressive symptoms have significantly reduced in the past week. She is continuing to deny SI, denies HI, denies AVH, denies paranoia, and  there is no evidence of delusional thinking. Mood is depressed, affect is congruent, but there is an overall improvement both objectively and subjectively in depressive symptoms and presentation. She is continuing to report readiness for discharge, and plan remains to discharge her tomorrow (04/28/22) back to her home where she lives with her son. Writer spent a lot of time asking pt questions regarding safety in the home, and pt denies any safety concerns at her home, and states that her son is supportive and helpful. Writer made attempts to call pt's daughter Marie Diaz, [phone]) to educate on management of her medications after discharge, and there was no answer. Writer will make one more attempt this afternoon to reach family for medication education and safety since pt has mild cognitive impairment. Pt continues to have some psychomotor agitation, but she reports that this was preexistent prior to this hospitalization, and gets worse with anxiety. She is continuing to report depression as 5 (10 being worst), and anxiety as 5 (10 being worst). This is a significant improvement as compared to last week. She is attending unit group sessions.  Pt reports appetite as being fair, states she can't really eat more due to stomach pain, and reports having a good sleep quality last night. We are continuing alll medications as listed below.  Principal Problem: Major depressive disorder, single episode with psychotic features ([place]) Diagnosis: Principal Problem:   Major depressive disorder, single episode with psychotic features [facility]) Active Problems:   Constipation   Generalized anxiety disorder  Insomnia  Total Time spent with patient:  I personally spent 45 minutes on the unit in direct patient care. The direct patient care time included face-to-face time with the patient, reviewing the patient's chart, communicating with other professionals, and coordinating care. Greater than 50% of this time  was spent in counseling or coordinating care with the patient regarding goals of hospitalization, psycho-education, and discharge planning needs.  Past Psychiatric History: Anxiety and no history of Suicide Attempts, Self Injurious Behavior, or Psychiatric Hospitalizations.   Past Medical History:  Past Medical History:  Diagnosis Date   Anxiety    Depression    Gastric ulcer    Gastritis    Hypertension     Past Surgical History:  Procedure Laterality Date   ABDOMINAL HYSTERECTOMY     BIOPSY  08/29/2019   Procedure: BIOPSY;  Surgeon: Danie Binder, MD;  Location: [facility];  Service: Endoscopy;;   BIOPSY  02/26/2021   Procedure: BIOPSY;  Surgeon: Eloise Harman, DO;  Location: [facility];  Service: Endoscopy;;   CHOLECYSTECTOMY     ESOPHAGOGASTRODUODENOSCOPY N/A 08/29/2019   normal esophagus, localized moderate inflammation with adherent blood, congestion, erosions, and friability on greater curvature of stomach. Biopsy with chronic gastritis, negative H.pylori.    ESOPHAGOGASTRODUODENOSCOPY (EGD) WITH PROPOFOL N/A 02/26/2021   gastritis and pathology with reactive gastropathy and negative H.pylori   HEMORRHOID SURGERY     Family History:  Family History  Problem Relation Age of Onset   Colon cancer Neg Hx    Colon polyps Neg Hx    Family Psychiatric  History: Reports None Social History:  Social History   Substance and Sexual Activity  Alcohol Use No     Social History   Substance and Sexual Activity  Drug Use No    Social History   Socioeconomic History   Marital status: Married    Spouse name: Not on file   Number of children: Not on file   Years of education: Not on file   Highest education level: Not on file  Occupational History   Not on file  Tobacco Use   Smoking status: Every Day    Packs/day: 0.50    Types: Cigarettes   Smokeless tobacco: Never  Vaping Use   Vaping Use: Former  Substance and Sexual Activity   Alcohol use: No    Drug use: No   Sexual activity: Yes  Other Topics Concern   Not on file  Social History Narrative   [phone]), kids(2). 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 SHE'S AN ONLY CHILD.   Social Determinants of Health   Financial Resource Strain: Not on file  Food Insecurity: Not on file  Transportation Needs: Not on file  Physical Activity: Not on file  Stress: Not on file  Social Connections: Not on file   Additional Social History:     Sleep: Poor  Appetite:  Fair  Current Medications: Current Facility-Administered Medications  Medication Dose Route Frequency Provider Last Rate Last Admin   acetaminophen (TYLENOL) tablet 650 mg  650 mg Oral Q6H PRN Tharon Aquas, NP   650 mg at 04/26/22 1851   ALPRAZolam (XANAX) tablet 0.5 mg  0.5 mg Oral BID PRN Larita Fife, MD   0.5 mg at 04/26/22 2035   alum & mag hydroxide-simeth (MAALOX/MYLANTA) 200-200-20 MG/5ML suspension 30 mL  30 mL Oral Q4H PRN Tharon Aquas, NP   30 mL at  04/26/22 1828   amLODipine (NORVASC) tablet 10 mg  10 mg Oral Daily Roby Spalla, NP   10 mg at 04/27/22 0800   benztropine (COGENTIN) tablet 0.5 mg  0.5 mg Oral Daily PRN Nicholes Rough, NP   0.5 mg at 04/26/22 1355   donepezil (ARICEPT) tablet 5 mg  5 mg Oral QHS Attiah, Nadir, MD   5 mg at 04/26/22 2033   famotidine (PEPCID) tablet 20 mg  20 mg Oral Daily Cobain Morici, NP   20 mg at 04/27/22 0800   feeding supplement (ENSURE ENLIVE / ENSURE PLUS) liquid 237 mL  237 mL Oral BID BM Tharon Aquas, NP   237 mL at [id number]   folic acid (FOLVITE) tablet 1 mg  1 mg Oral Daily Paliy, Delrae Rend, MD   1 mg at 04/27/22 0800   haloperidol (HALDOL) tablet 10 mg  10 mg Oral QHS Cleatus Goodin, NP       hydrOXYzine (ATARAX) tablet 25 mg  25 mg Oral TID PRN Nicholes Rough, NP   25 mg at 04/26/22 1355   irbesartan (AVAPRO) tablet 75 mg  75 mg Oral Daily Rhyder Bratz, NP   75 mg at 04/27/22 0800   magnesium  hydroxide (MILK OF MAGNESIA) suspension 30 mL  30 mL Oral Daily PRN Tharon Aquas, NP   30 mL at 04/26/22 1830   OLANZapine zydis (ZYPREXA) disintegrating tablet 5 mg  5 mg Oral Q8H PRN Tharon Aquas, NP   5 mg at 04/16/22 2351   propranolol (INDERAL) tablet 10 mg  10 mg Oral BID Nicholes Rough, NP   10 mg at 04/27/22 0800   senna-docusate (Senokot-S) tablet 1 tablet  1 tablet Oral QHS Nicholes Rough, NP   1 tablet at 04/26/22 2033   sertraline (ZOLOFT) tablet 100 mg  100 mg Oral Daily Nicholes Rough, NP   100 mg at 04/27/22 0800   traZODone (DESYREL) tablet 100 mg  100 mg Oral QHS Nicholes Rough, NP   100 mg at 04/26/22 2033    Lab Results:  No results found for this or any previous visit (from the past 64 hour(s)).   Blood Alcohol level:  Lab Results  Component Value Date   ETH <10 04/11/2022   ETH <10 99991111    Metabolic Disorder Labs: Lab Results  Component Value Date   HGBA1C 4.8 04/11/2022   MPG 91.06 04/11/2022   No results found for: "PROLACTIN" Lab Results  Component Value Date   CHOL 177 04/11/2022   TRIG 193 (H) 04/11/2022   HDL 41 04/11/2022   CHOLHDL 4.3 04/11/2022   VLDL 39 04/11/2022   LDLCALC 97 04/11/2022   Physical Findings: AIMS: 0 CIWA:  n/a COWS: n/a   Musculoskeletal: Strength & Muscle Tone: decreased Gait & Station: normal Patient leans: N/A  Psychiatric Specialty Exam:  Presentation  General Appearance:  Fairly Groomed  Eye Contact: Good  Speech: Clear and Coherent  Speech Volume: Normal  Handedness:Right  Mood and Affect  Mood: Depressed  Affect: Congruent  Thought Process  Thought Processes: Coherent  Descriptions of Associations:Intact  Orientation:Full (Time, Place and Person)  Thought Content:Logical  History of Schizophrenia/Schizoaffective disorder:No  Duration of Psychotic Symptoms:Less than six months  Hallucinations:Hallucinations: None  Ideas of Reference:None  Suicidal  Thoughts:Suicidal Thoughts: No  Homicidal Thoughts:Homicidal Thoughts: No  Sensorium  Memory: Immediate Good  Judgment: Fair  Insight: Fair  Community education officer  Concentration: Fair  Attention Span: Good  Recall: Good  Fund of Knowledge:  Good  Language: Good  Psychomotor Activity  Psychomotor Activity:Psychomotor Activity: Tremor  Assets  Assets: Housing; Resilience  Sleep  Sleep:Sleep: Good  Physical Exam: Physical Exam Vitals and nursing note reviewed.  Constitutional:      General: She is not in acute distress.    Appearance: Normal appearance. She is normal weight. She is ill-appearing. She is not toxic-appearing.  HENT:     Head: Normocephalic and atraumatic.  Eyes:     Pupils: Pupils are equal, round, and reactive to light.  Pulmonary:     Effort: Pulmonary effort is normal.  Musculoskeletal:        General: Normal range of motion.  Neurological:     General: No focal deficit present.     Mental Status: She is alert and oriented to person, place, and time.    Review of Systems  Constitutional: Negative.   HENT: Negative.    Eyes: Negative.   Respiratory:  Negative for cough and shortness of breath.   Cardiovascular:  Negative for chest pain.  Gastrointestinal:  Negative for abdominal pain, constipation, diarrhea, nausea and vomiting.  Skin: Negative.   Neurological:  Positive for weakness (mild). Negative for dizziness and headaches (mild).  Psychiatric/Behavioral:  Positive for depression (resolving) and memory loss. Negative for hallucinations, substance abuse and suicidal ideas (passive SI). The patient is nervous/anxious. The patient does not have insomnia.    Blood pressure 106/74, pulse 99, temperature 98.2 F (36.8 C), temperature source Oral, resp. rate 16, height 5\' 1"  (1.549 m), SpO2 96 %. Body mass index is 18.89 kg/m.  Treatment Plan Summary: Daily contact with patient to assess and evaluate symptoms and progress in  treatment and Medication management PLAN Safety and Monitoring: Involuntary admission to inpatient psychiatric unit for safety, stabilization and treatment Daily contact with patient to assess and evaluate symptoms and progress in treatment Patient's case to be discussed in multi-disciplinary team meeting Observation Level : q15 minute checks Vital signs: q12 hours Precautions:Safety  MDD, Recurrent, Severe, w/Psychosis: -Continue Zoloft 100 mg for depressive symptoms -Continue Haldol 5 mg BID for psychosis -Previously discontinue Ritalin 5 mg daily as might have been worsening psychomotor agitation and anxiety -Changed Cogentin to 0.5 mg PRN daily as might be worsening constipation -Previously discontinued Remeron 45 mg QHS for depression -Previously discontinued Zyprexa to 15 mg d/t lack of efficacy -Continue Xanax 0.5 mg BID PRN for anxiety -Continue Aricept 5 mg daily for memory loss -Continue Agitation Protocol: Zyprexa/Ativan/Geodon  HTN: -Continue Amlodipine to 10 mg daily -Previously discontinued Clonidine 0.1 mg PRN (BP stable) -Continue Irbesartan 75 mg daily -Continue Inderal 10 mg BID for tachycardia & anxiety  GERD -Continue Famotidine 20 mg daily  Constipation -Give Fleet Enema x 1 dose -Given  Magnesium Citrate x 1 dose on 10/28 -Continue Senna-Colace daily -Discontinue Colace 100 mg daily  Folate Deficiency: -Continue Folic Acid 1 mg daily  Insomnia -Continue Trazodone to 75 mg nightly  Hypokalemia Resolved: -One time dose Kdur 40 mEq -K now 4.1 from 10/17  -Continue Ensure 237 mg BID -Continue PRN's: Tylenol, Maalox, Atarax, Milk of Magnesia  Labs: 10/13- CMP: WNL except for K: 3.0,  BUN < 5,  Total Protein: 6.4,  CBC: WNL except WBC: 11.8,  RBC: 5.27,  Hem: 16.7,  MCHC: 36.5,  Neut Abs: 8.4, Mono Abs: 1.1, Lipid Panel: WNL except Trig: 193,  A1c: 4.8, TSH: 1.902,  UDS: Benzo pos,  EtOH: Neg,  EKG: Sinus Tach with Qtc: 439 10/14- UA: WNL with  exception  of straw color  10/15-  RPR: Neg,  Folate: 5.2,  Ceruloplasmin: 15.6,  HIV nonreactive  Nicholes Rough, NP 04/27/2022, 11:19 AMPatient ID: Marie Diaz, female   DOB: 05/17/1966, 56 y.o. Patient ID: Marie Diaz, female  Patient ID: Marie Diaz, female

## 2022-04-28 ENCOUNTER — Encounter (HOSPITAL_COMMUNITY): Payer: Self-pay

## 2022-04-28 DIAGNOSIS — F323 Major depressive disorder, single episode, severe with psychotic features: Secondary | ICD-10-CM | POA: Diagnosis not present

## 2022-04-28 MED ORDER — IRBESARTAN 75 MG PO TABS: 75.0000 mg | ORAL_TABLET | Freq: Every day | ORAL | 0 refills | Status: DC

## 2022-04-28 MED ORDER — HALOPERIDOL 10 MG PO TABS: 10.0000 mg | ORAL_TABLET | Freq: Every day | ORAL | 0 refills | Status: DC

## 2022-04-28 MED ORDER — HYDROXYZINE HCL 25 MG PO TABS: 25.0000 mg | ORAL_TABLET | Freq: Three times a day (TID) | ORAL | 0 refills | Status: AC | PRN

## 2022-04-28 MED ORDER — FAMOTIDINE 20 MG PO TABS: 20.0000 mg | ORAL_TABLET | Freq: Every day | ORAL | Status: DC

## 2022-04-28 MED ORDER — BENZTROPINE MESYLATE 0.5 MG PO TABS: 0.5000 mg | ORAL_TABLET | Freq: Every day | ORAL | 0 refills | Status: DC | PRN

## 2022-04-28 MED ORDER — SERTRALINE HCL 100 MG PO TABS: 100.0000 mg | ORAL_TABLET | Freq: Every day | ORAL | 0 refills | Status: DC

## 2022-04-28 MED ORDER — AMLODIPINE BESYLATE 10 MG PO TABS: 10.0000 mg | ORAL_TABLET | Freq: Every day | ORAL | 0 refills | Status: DC

## 2022-04-28 MED ORDER — DONEPEZIL HCL 5 MG PO TABS: 5.0000 mg | ORAL_TABLET | Freq: Every day | ORAL | 0 refills | Status: DC

## 2022-04-28 MED ORDER — PROPRANOLOL HCL 10 MG PO TABS: 10.0000 mg | ORAL_TABLET | Freq: Two times a day (BID) | ORAL | 0 refills | Status: DC

## 2022-04-28 MED ORDER — ENSURE ENLIVE PO LIQD: 237.0000 mL | Freq: Two times a day (BID) | ORAL | 12 refills | Status: DC

## 2022-04-28 MED ORDER — FOLIC ACID 1 MG PO TABS: 1.0000 mg | ORAL_TABLET | Freq: Every day | ORAL | Status: DC

## 2022-04-28 MED ORDER — TRAZODONE HCL 100 MG PO TABS: 100.0000 mg | ORAL_TABLET | Freq: Every day | ORAL | 0 refills | Status: DC

## 2022-04-28 MED ORDER — SENNOSIDES-DOCUSATE SODIUM 8.6-50 MG PO TABS: 1.0000 | ORAL_TABLET | Freq: Every day | ORAL | Status: DC

## 2022-04-28 NOTE — Plan of Care (Signed)
?  Problem: Coping: ?Goal: Coping ability will improve ?Outcome: Progressing ?Goal: Will verbalize feelings ?Outcome: Progressing ?  ?Problem: Education: ?Goal: Will be free of psychotic symptoms ?Outcome: Progressing ?Goal: Knowledge of the prescribed therapeutic regimen will improve ?Outcome: Progressing ?  ?

## 2022-04-28 NOTE — Discharge Instructions (Signed)
-  Follow-up with your outpatient psychiatric provider -instructions on appointment date, time, and address (location) are provided to you in discharge paperwork.  -Take your psychiatric medications as prescribed at discharge - instructions are provided to you in the discharge paperwork  -Follow-up with outpatient primary care doctor and other specialists -for management of preventative medicine and any chronic medical disease.  -Recommend abstinence from alcohol, tobacco, and other illicit drug use at discharge.   -If your psychiatric symptoms recur, worsen, or if you have side effects to your psychiatric medications, call your outpatient psychiatric provider, 911, [phone] or go to the nearest emergency department.  -If suicidal thoughts recur, call your outpatient psychiatric provider, 911, [phone] or go to the nearest emergency department.  

## 2022-04-28 NOTE — [facility] Suicide Risk Assessment (Signed)
Suicide Risk Assessment  Discharge Assessment    [facility] Discharge Suicide Risk Assessment   Principal Problem: Major depressive disorder, single episode with psychotic features [facility]) Discharge Diagnoses: Principal Problem:   Major depressive disorder, single episode with psychotic features ([place]) Active Problems:   Constipation   Generalized anxiety disorder   Insomnia  Reason For Admission:   AIZLYN SCHIFANO is a 56 yr old female who presented to [facility] on 10/13 due to depression, SI, and command auditory hallucinations, she was admitted to [facility] on 10/14.  PPHx is significant for Anxiety and no history of Suicide Attempts, Self Injurious Behavior, or Psychiatric Hospitalizations.                                            HOSPITAL COURSE During the patient's hospitalization, patient had extensive initial psychiatric evaluation, and follow-up psychiatric evaluations every day. Psychiatric diagnoses provided upon initial assessment were as follows: Principal Problem:   Major depressive disorder, single episode with psychotic features ([place]) Active Problems:   Constipation   Generalized anxiety disorder   Insomnia  Patient's psychiatric medications were adjusted on admission as follows: -Start Olanzapine 5mg  PO QHS for psychosis, depression, sleep -restart home Xanax at reduced dose 0.5mg  PO BID PRN anxiety. -Continue Mirtazapine 7.5mg  PO QHS for depression, sleep. -Restarted Clonidine 0.1mg  PO BID for htn -Patient declined Nicotine patch   During the hospitalization, other adjustments were made to the patient's psychiatric medication regimen.  -Discontinued Clonidine 0.1 mg PRN (BP stable) -Discontinued Ritalin 5 mg daily as might have been worsening   psychomotor   agitation and anxiety -Discontinued Remeron 45 mg QHS for depression due to lack of efficacy -Discontinued Zyprexa to 15 mg d/t lack of efficacy.  -Discontinued Xanax 0.5 mg BID PRN for anxiety  Medications to continue  taking after discharge are as follows: -Continue Zoloft 100 mg for depressive symptoms -Continue Haldol 5 mg BID for psychosis -Continue Aricept 5 mg daily for memory loss -Cogentin to 0.5 mg PRN daily for EPS  -Continue Amlodipine to 10 mg daily for hypertension -Continue Irbesartan 75 mg daily for hypertension -Continue Inderal 10 mg BID for tachycardia & anxiety -Continue Senna-Colace daily for constipation -Continue Trazodone to 75 mg nightly for sleep -Continue Folic Acid 1 mg daily  Patient's care was discussed during the interdisciplinary team meeting every day during the hospitalization. The patient denies having side effects to prescribed psychiatric medication. Gradually, patient started adjusting to milieu. The patient was evaluated each day by a clinical provider to ascertain response to treatment. Improvement was noted by the patient's report of decreasing symptoms, improved sleep and appetite, affect, medication tolerance, behavior, and participation in unit programming.  Patient was asked each day to complete a self inventory noting mood, mental status, pain, new symptoms, anxiety and concerns.    Symptoms were reported as significantly decreased or resolved completely by discharge. On day of discharge, the patient reports that their mood is stable. The patient denied having suicidal thoughts for more than 48 hours prior to discharge.  Patient denies having homicidal thoughts.  Patient denies having auditory hallucinations.  Patient denies any visual hallucinations or other symptoms of psychosis. The patient was motivated to continue taking medication with a goal of continued improvement in mental health.   The patient reports their target psychiatric symptoms of depression, psychosis and insomnia responded well to the psychiatric medications,  and the patient reports overall benefit from this psychiatric hospitalization. Supportive psychotherapy was provided to the patient. The patient  also participated in regular group therapy while hospitalized. Coping skills, problem solving as well as relaxation therapies were also part of the unit programming.  Total Time spent with patient: 30 minutes  Musculoskeletal: Strength & Muscle Tone: within normal limits Gait & Station: normal Patient leans: N/A  Psychiatric Specialty Exam  Presentation  General Appearance:  Appropriate for Environment; Fairly Groomed  Eye Contact: Good  Speech: Clear and Coherent  Speech Volume: Normal  Handedness: Right  Mood and Affect  Mood: Euthymic  Duration of Depression Symptoms: Greater than two weeks  Affect: Congruent  Thought Process  Thought Processes: Coherent  Descriptions of Associations:Intact  Orientation:Full (Time, Place and Person)  Thought Content:Logical  History of Schizophrenia/Schizoaffective disorder:No  Duration of Psychotic Symptoms:Less than six months  Hallucinations:Hallucinations: None  Ideas of Reference:None  Suicidal Thoughts:Suicidal Thoughts: No  Homicidal Thoughts:Homicidal Thoughts: No  Sensorium  Memory: Immediate Good  Judgment: Fair  Insight: Fair  Art therapist  Concentration: Good  Attention Span: Good  Recall: Fair  Fund of Knowledge: Fair  Language: Fair  Psychomotor Activity  Psychomotor Activity: Psychomotor Activity: Normal  Assets  Assets: Communication Skills  Sleep  Sleep: Sleep: Good  Physical Exam: Physical Exam Constitutional:      Appearance: Normal appearance.  HENT:     Head: Normocephalic.     Nose: Nose normal. No congestion or rhinorrhea.  Eyes:     Pupils: Pupils are equal, round, and reactive to light.  Pulmonary:     Effort: Pulmonary effort is normal.  Musculoskeletal:        General: Normal range of motion.     Cervical back: Normal range of motion.  Neurological:     Mental Status: She is alert and oriented to person, place, and time.    Review of  Systems  Constitutional:  Negative for fever.  HENT:  Negative for hearing loss.   Respiratory:  Negative for cough.   Cardiovascular:  Negative for chest pain.  Gastrointestinal: Negative.   Genitourinary:  Negative for dysuria.  Musculoskeletal: Negative.   Skin: Negative.   Neurological: Negative.   Psychiatric/Behavioral:  Positive for depression (Denies SI, denies HI, denies AVH, denies psychosis, contracts for safty outside of this [facility]) and memory loss (mild memory loss, on Aricept). Negative for hallucinations, substance abuse and suicidal ideas. The patient is nervous/anxious (resolving on current medications) and has insomnia (resolved on current medications).    Blood pressure 107/66, pulse 71, temperature 98 F (36.7 C), temperature source Oral, resp. rate 16, height 5\' 1"  (1.549 m), SpO2 99 %. Body mass index is 18.89 kg/m.  Mental Status Per Nursing Assessment::   On Admission:  Suicidal ideation indicated by patient  Demographic Factors:  Caucasian  Loss Factors: Financial problems/change in socioeconomic status  Historical Factors: NA  Risk Reduction Factors:   Living with another person, especially a relative  Continued Clinical Symptoms:  Previous Psychiatric Diagnoses and Treatments-Pt currently denies SI,denies HI, denies AVH, psychosis has resolved and she verbalizes readiness for discharge. She verbally contracts for safety outside of this [facility]  Cognitive Features That Contribute To Risk:  None    Suicide Risk:  Mild:  There are no identifiable suicide plans, no associated intent, mild dysphoria and related symptoms, good self-control (both objective and subjective assessment), few other risk factors, and identifiable protective factors, including available and accessible social support.  Follow-up Information     [facility]. Go on 05/01/2022.   Why: You have a behavioral health assessment appointment to get connected to  thearpy and med management on 05/01/22 at 12:30pm.  Please go in person to this appointment so that you can fill out paperwork and get scheduled for follow up appts. ([phone] for discharge paperwork. Contact information: [address] [place] [address] [phone]                Plan Of Care/Follow-up recommendations:  abs were reviewed with the patient, and abnormal results were discussed with the patient.  The patient is able to verbalize their individual safety plan to this provider.  # It is recommended to the patient to continue psychiatric medications as prescribed, after discharge from the hospital.    # It is recommended to the patient to follow up with your outpatient psychiatric provider and PCP.  # It was discussed with the patient, the impact of alcohol, drugs, tobacco have been there overall psychiatric and medical wellbeing, and total abstinence from substance use was recommended the patient.ed.  # Prescriptions provided or sent directly to preferred pharmacy at discharge. Patient agreeable to plan. Given opportunity to ask questions. Appears to feel comfortable with discharge.    # In the event of worsening symptoms, the patient is instructed to call the crisis hotline (988), 911 and or go to the nearest ED for appropriate evaluation and treatment of symptoms. To follow-up with primary care provider for other medical issues, concerns and or health care needs  # Patient was discharged home with a plan to follow up as noted above.   Starleen Blue, NP 04/28/2022, 10:30 AM

## 2022-04-28 NOTE — Progress Notes (Signed)
D/c Instructions-Education: [place] Discharge instructions given to patient and she verbalized understanding of all discharge information given to her. D/c education completed with patient including follow up instructions, medication list, d/c activities limitations if indicated, with other d/c instructions as indicated by MD - patient able to verbalize understanding, all questions fully answered. Patient denied SI/HI/AVH. Suicide prevention information given and discussed with patient and understanding of D/C information verbalized by patient. All questions asked were answered by staff, patient stated they have no questions at time of D/C. Patient received all belongings in locker 16, brought to the unit at discharge. Patient showed appreciation of care given to her at the [facility] unit. Patient was escorted to the lobby and left for home in private auto at 1140.    Dorris Carnes, RN

## 2022-04-28 NOTE — Discharge Summary (Signed)
Physician Discharge Summary Note  Patient:  Marie Diaz is an 56 y.o., female MRN:  [id number] DOB:  Apr 25, 1966 Patient phone:  [phone] (home)  Patient address:   [address] [place] [address],  Total Time spent with patient: 30 minutes  Date of Admission:  04/11/2022 Date of Discharge: 04/28/2022  Reason for Admission:  Marie Diaz is a 56 yr old female who presented to [facility] on 10/13 due to depression, SI, and command auditory hallucinations, she was admitted to [facility] on 10/14.  PPHx is significant for Anxiety and no history of Suicide Attempts, Self Injurious Behavior, or Psychiatric Hospitalizations.   Principal Problem: Major depressive disorder, single episode with psychotic features [facility]) Discharge Diagnoses: Principal Problem:   Major depressive disorder, single episode with psychotic features (HCC) Active Problems:   Constipation   Generalized anxiety disorder   Insomnia  Past Psychiatric History: As above  Past Medical History:  Past Medical History:  Diagnosis Date   Anxiety    Depression    Gastric ulcer    Gastritis    Hypertension     Past Surgical History:  Procedure Laterality Date   ABDOMINAL HYSTERECTOMY     BIOPSY  08/29/2019   Procedure: BIOPSY;  Surgeon: West Bali, MD;  Location: [facility];  Service: Endoscopy;;   BIOPSY  02/26/2021   Procedure: BIOPSY;  Surgeon: Lanelle Bal, DO;  Location: [facility];  Service: Endoscopy;;   CHOLECYSTECTOMY     ESOPHAGOGASTRODUODENOSCOPY N/A 08/29/2019   normal esophagus, localized moderate inflammation with adherent blood, congestion, erosions, and friability on greater curvature of stomach. Biopsy with chronic gastritis, negative H.pylori.    ESOPHAGOGASTRODUODENOSCOPY (EGD) WITH PROPOFOL N/A 02/26/2021   gastritis and pathology with reactive gastropathy and negative H.pylori   HEMORRHOID SURGERY     Family History:  Family History  Problem Relation Age of Onset   Colon  cancer Neg Hx    Colon polyps Neg Hx    Family Psychiatric  History: none reported Social History:  Social History   Substance and Sexual Activity  Alcohol Use No     Social History   Substance and Sexual Activity  Drug Use No    Social History   Socioeconomic History   Marital status: Married    Spouse name: Not on file   Number of children: Not on file   Years of education: Not on file   Highest education level: Not on file  Occupational History   Not on file  Tobacco Use   Smoking status: Every Day    Packs/day: 0.50    Types: Cigarettes   Smokeless tobacco: Never  Vaping Use   Vaping Use: Former  Substance and Sexual Activity   Alcohol use: No   Drug use: No   Sexual activity: Yes  Other Topics Concern   Not on file  Social History Narrative   [phone]), kids(2). 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 SHE'S AN ONLY CHILD.   Social Determinants of Health   Financial Resource Strain: Not on file  Food Insecurity: Not on file  Transportation Needs: Not on file  Physical Activity: Not on file  Stress: Not on file  Social Connections: Not on file  HOSPITAL COURSE During the patient's hospitalization, patient had extensive initial psychiatric evaluation, and follow-up psychiatric evaluations every day. Psychiatric diagnoses provided upon initial assessment were as follows: Principal Problem:   Major depressive disorder, single episode with psychotic features ([place]) Active Problems:   Constipation   Generalized anxiety disorder   Insomnia   Patient's psychiatric medications were adjusted on admission as follows: -Start Olanzapine 5mg  PO QHS for psychosis, depression, sleep -restart home Xanax at reduced dose 0.5mg  PO BID PRN anxiety. -Continue Mirtazapine 7.5mg  PO QHS for depression, sleep. -Restarted Clonidine 0.1mg  PO BID for htn -Patient declined Nicotine  patch    During the hospitalization, other adjustments were made to the patient's psychiatric medication regimen.  -Discontinued Clonidine 0.1 mg PRN (BP stable) -Discontinued Ritalin 5 mg daily as might have been worsening   psychomotor   agitation and anxiety -Discontinued Remeron 45 mg QHS for depression due to lack of efficacy -Discontinued Zyprexa to 15 mg d/t lack of efficacy.  -Discontinued Xanax 0.5 mg BID PRN for anxiety   Medications to continue taking after discharge are as follows: -Continue Zoloft 100 mg for depressive symptoms -Continue Haldol 5 mg BID for psychosis -Continue Aricept 5 mg daily for memory loss -Cogentin to 0.5 mg PRN daily for EPS  -Continue Amlodipine to 10 mg daily for hypertension -Continue Irbesartan 75 mg daily for hypertension -Continue Inderal 10 mg BID for tachycardia & anxiety -Continue Senna-Colace daily for constipation -Continue Trazodone to 75 mg nightly for sleep -Continue Folic Acid 1 mg daily   Patient's care was discussed during the interdisciplinary team meeting every day during the hospitalization. The patient denies having side effects to prescribed psychiatric medication. Gradually, patient started adjusting to milieu. The patient was evaluated each day by a clinical provider to ascertain response to treatment. Improvement was noted by the patient's report of decreasing symptoms, improved sleep and appetite, affect, medication tolerance, behavior, and participation in unit programming.  Patient was asked each day to complete a self inventory noting mood, mental status, pain, new symptoms, anxiety and concerns.     Symptoms were reported as significantly decreased or resolved completely by discharge. On day of discharge, the patient reports that their mood is stable. The patient denied having suicidal thoughts for more than 48 hours prior to discharge.  Patient denies having homicidal thoughts.  Patient denies having auditory hallucinations.   Patient denies any visual hallucinations or other symptoms of psychosis. The patient was motivated to continue taking medication with a goal of continued improvement in mental health.    The patient reports their target psychiatric symptoms of depression, psychosis and insomnia responded well to the psychiatric medications, and the patient reports overall benefit from this psychiatric hospitalization. Supportive psychotherapy was provided to the patient. The patient also participated in regular group therapy while hospitalized. Coping skills, problem solving as well as relaxation therapies were also part of the unit programming.  Physical Findings: AIMS: Facial and Oral Movements Muscles of Facial Expression: None, normal Lips and Perioral Area: None, normal Jaw: None, normal Tongue: None, normal,Extremity Movements Upper (arms, wrists, hands, fingers): Mild Lower (legs, knees, ankles, toes): Moderate, Trunk Movements Neck, shoulders, hips: None, normal, Overall Severity Severity of abnormal movements (highest score from questions above): Mild Incapacitation due to abnormal movements: None, normal Patient's awareness of abnormal movements (rate only patient's report): Aware, mild distress, Dental Status Current problems with teeth and/or dentures?: No Does patient usually wear dentures?: No  CIWA:  n/a  COWS: n/a  AIMS:2 -Tremors  to b/l hands and feet    Musculoskeletal: Strength & Muscle Tone: within normal limits Gait & Station: normal Patient leans: N/A  Psychiatric Specialty Exam:  Presentation  General Appearance:  Appropriate for Environment; Fairly Groomed  Eye Contact: Good  Speech: Clear and Coherent  Speech Volume: Normal  Handedness: Right   Mood and Affect  Mood: Euthymic  Affect: Congruent   Thought Process  Thought Processes: Coherent  Descriptions of Associations:Intact  Orientation:Full (Time, Place and Person)  Thought  Content:Logical  History of Schizophrenia/Schizoaffective disorder:No  Duration of Psychotic Symptoms:Less than six months  Hallucinations:Hallucinations: None  Ideas of Reference:None  Suicidal Thoughts:Suicidal Thoughts: No  Homicidal Thoughts:Homicidal Thoughts: No   Sensorium  Memory: Immediate Good  Judgment: Fair  Insight: Fair   Art therapist  Concentration: Good  Attention Span: Good  Recall: Fair  Fund of Knowledge: Fair  Language: Fair   Psychomotor Activity  Psychomotor Activity: Psychomotor Activity: Normal   Assets  Assets: Communication Skills   Sleep  Sleep: Sleep: Good  Physical Exam: Physical Exam Constitutional:      Appearance: Normal appearance.  HENT:     Head: Normocephalic.     Nose: Nose normal. No congestion or rhinorrhea.  Eyes:     Pupils: Pupils are equal, round, and reactive to light.  Pulmonary:     Effort: Pulmonary effort is normal.  Musculoskeletal:        General: Normal range of motion.     Cervical back: Normal range of motion.  Neurological:     General: No focal deficit present.     Mental Status: She is alert and oriented to person, place, and time.    Review of Systems  Constitutional:  Negative for chills and fever.  HENT:  Negative for sore throat.   Eyes: Negative.   Respiratory:  Negative for cough.   Cardiovascular:  Negative for chest pain.  Gastrointestinal:  Negative for heartburn.  Musculoskeletal: Negative.   Psychiatric/Behavioral:  Positive for depression (Denies SI, denies HI, denies AVH, verbally contracts for safety outside of this [facility]) and memory loss (mild cognitive impairment as per MOCA score). Negative for hallucinations, substance abuse and suicidal ideas. The patient is nervous/anxious (significantly improved on current medications) and has insomnia (resolved on current medications).    Blood pressure 107/66, pulse 71, temperature 98 F (36.7 C), temperature  source Oral, resp. rate 16, height 5\' 1"  (1.549 m), SpO2 99 %. Body mass index is 18.89 kg/m.  Social History   Tobacco Use  Smoking Status Every Day   Packs/day: 0.50   Types: Cigarettes  Smokeless Tobacco Never   Tobacco Cessation:  A prescription for an FDA-approved tobacco cessation medication was offered at discharge and the patient refused-Pt refused a nicotine patch or the nicorette gum on admission. These were offered again at discharge and she declined. Stated she wants to continue smoking cigarettes after discharge. Blood Alcohol level:  Lab Results  Component Value Date   ETH <10 04/11/2022   ETH <10 06/11/2019   Metabolic Disorder Labs:  Lab Results  Component Value Date   HGBA1C 4.8 04/11/2022   MPG 91.06 04/11/2022   No results found for: "PROLACTIN" Lab Results  Component Value Date   CHOL 177 04/11/2022   TRIG 193 (H) 04/11/2022   HDL 41 04/11/2022   CHOLHDL 4.3 04/11/2022   VLDL 39 04/11/2022   LDLCALC 97 04/11/2022   See Psychiatric Specialty Exam and Suicide Risk Assessment completed by Attending Physician prior to discharge.  Discharge destination:  Home  Is patient on multiple antipsychotic therapies at discharge:  No   Has Patient had three or more failed trials of antipsychotic monotherapy by history:  No  Recommended Plan for Multiple Antipsychotic Therapies: NA  Discharge Instructions     Diet - low sodium heart healthy   Complete by: As directed    Increase activity slowly   Complete by: As directed       Allergies as of 04/28/2022       Reactions   Tetracyclines & Related Other (See Comments)   Unknown reaction (patient does not recall)   Ultram [tramadol] Other (See Comments)   "knocks me out"        Medication List     STOP taking these medications    ALPRAZolam 1 MG tablet Commonly known as: XANAX   cloNIDine 0.2 MG tablet Commonly known as: CATAPRES       TAKE these medications      Indication  amLODipine  10 MG tablet Commonly known as: NORVASC Take 1 tablet (10 mg total) by mouth daily. Start taking on: April 29, 2022  Indication: High Blood Pressure Disorder   benztropine 0.5 MG tablet Commonly known as: COGENTIN Take 1 tablet (0.5 mg total) by mouth daily as needed for up to 10 doses for tremors.  Indication: Extrapyramidal Reaction caused by Medications, Parkinsonian-Like Syndrome   donepezil 5 MG tablet Commonly known as: ARICEPT Take 1 tablet (5 mg total) by mouth at bedtime.  Indication: mild cognitive impairment   famotidine 20 MG tablet Commonly known as: PEPCID Take 1 tablet (20 mg total) by mouth daily. Start taking on: April 29, 2022  Indication: Gastroesophageal Reflux Disease   feeding supplement Liqd Take 237 mLs by mouth 2 (two) times daily between meals.  Indication: nurishment   folic acid 1 MG tablet Commonly known as: FOLVITE Take 1 tablet (1 mg total) by mouth daily. Start taking on: April 29, 2022  Indication: vitamin   haloperidol 10 MG tablet Commonly known as: HALDOL Take 1 tablet (10 mg total) by mouth at bedtime.  Indication: Psychosis   hydrOXYzine 25 MG tablet Commonly known as: ATARAX Take 1 tablet (25 mg total) by mouth 3 (three) times daily as needed for anxiety.  Indication: Feeling Anxious   irbesartan 75 MG tablet Commonly known as: AVAPRO Take 1 tablet (75 mg total) by mouth daily. Start taking on: April 29, 2022  Indication: High Blood Pressure Disorder   propranolol 10 MG tablet Commonly known as: INDERAL Take 1 tablet (10 mg total) by mouth 2 (two) times daily.  Indication: Fine to Coarse Slow Tremor Affecting Head, Hands & Voice, tachycardia   senna-docusate 8.6-50 MG tablet Commonly known as: Senokot-S Take 1 tablet by mouth at bedtime.  Indication: Constipation   sertraline 100 MG tablet Commonly known as: ZOLOFT Take 1 tablet (100 mg total) by mouth daily. Start taking on: April 29, 2022  Indication:  Major Depressive Disorder   traZODone 100 MG tablet Commonly known as: DESYREL Take 1 tablet (100 mg total) by mouth at bedtime.  Indication: Trouble Sleeping        Follow-up Information     [facility]   Why: You have a behavioral health assessment appointment to get connected to thearpy and med management on 05/01/22 at 12:30pm.  Please go in person to this appointment so that you can fill out paperwork and get scheduled for follow up appts. [phone]  for discharge paperwork. Contact information: [address] [place] [address] [phone]                Follow-up recommendations:  Activity:  As tolerated Labs were reviewed with the patient, and abnormal results were discussed with the patient.   The patient is able to verbalize their individual safety plan to this provider.   # It is recommended to the patient to continue psychiatric medications as prescribed, after discharge from the hospital.     # It is recommended to the patient to follow up with your outpatient psychiatric provider and PCP.   # It was discussed with the patient, the impact of alcohol, drugs, tobacco have been there overall psychiatric and medical wellbeing, and total abstinence from substance use was recommended the patient.ed.   # Prescriptions provided or sent directly to preferred pharmacy at discharge. Patient agreeable to plan. Given opportunity to ask questions. Appears to feel comfortable with discharge.    # In the event of worsening symptoms, the patient is instructed to call the crisis hotline (988), 911 and or go to the nearest ED for appropriate evaluation and treatment of symptoms. To follow-up with primary care provider for other medical issues, concerns and or health care needs   # Patient was discharged home with a plan to follow up as noted above.   Signed: Starleen Blue, NP 04/28/2022, 10:40 AM

## 2022-04-28 NOTE — Progress Notes (Signed)
  [facility] Adult Case Management Discharge Plan :  Will you be returning to the same living situation after discharge:  Yes,  home At discharge, do you have transportation home?: Yes,  son will be picking patient up Do you have the ability to pay for your medications: Yes,  patient provided with discount card and referred to clinic with sliding scale.   Release of information consent forms completed and in the chart;  Patient's signature needed at discharge.  Patient to Follow up at:  [place]. Go on 05/01/2022.   Why: You have a behavioral health assessment appointment to get connected to thearpy and med management on 05/01/22 at 12:30pm.  Please go in person to this appointment so that you can fill out paperwork and get scheduled for follow up appts. [phone] for discharge paperwork. Contact information: [place] [address] [phone]                 Next level of care provider has access to [facility] and Suicide Prevention discussed: Yes,  son, Yalissa Fink [phone]     Has patient been referred to the Quitline?: Patient refused referral  Patient has been referred for addiction treatment: [place], LCSW 04/28/2022, 10:05 AM

## 2022-04-28 NOTE — BH IP Treatment Plan (Signed)
Interdisciplinary Treatment and Diagnostic Plan Update  04/28/2022 Time of Session: 0830 Marie Diaz MRN: [id number]  Principal Diagnosis: Major depressive disorder, single episode with psychotic features [facility])  Secondary Diagnoses: Principal Problem:   Major depressive disorder, single episode with psychotic features (HCC) Active Problems:   Constipation   Generalized anxiety disorder   Insomnia   Current Medications:  Current Facility-Administered Medications  Medication Dose Route Frequency Provider Last Rate Last Admin   acetaminophen (TYLENOL) tablet 650 mg  650 mg Oral Q6H PRN Lauree Chandler, NP   650 mg at 04/26/22 1851   ALPRAZolam Prudy Feeler) tablet 0.5 mg  0.5 mg Oral BID PRN Thalia Party, MD   0.5 mg at 04/27/22 2042   alum & mag hydroxide-simeth (MAALOX/MYLANTA) 200-200-20 MG/5ML suspension 30 mL  30 mL Oral Q4H PRN Lauree Chandler, NP   30 mL at 04/26/22 1828   amLODipine (NORVASC) tablet 10 mg  10 mg Oral Daily Starleen Blue, NP   10 mg at 04/28/22 0809   benztropine (COGENTIN) tablet 0.5 mg  0.5 mg Oral Daily PRN Starleen Blue, NP   0.5 mg at 04/28/22 0610   donepezil (ARICEPT) tablet 5 mg  5 mg Oral QHS Attiah, Nadir, MD   5 mg at 04/27/22 2043   famotidine (PEPCID) tablet 20 mg  20 mg Oral Daily Nkwenti, Tyler Aas, NP   20 mg at 04/28/22 0809   feeding supplement (ENSURE ENLIVE / ENSURE PLUS) liquid 237 mL  237 mL Oral BID BM Lauree Chandler, NP   237 mL at 04/28/22 0810   folic acid (FOLVITE) tablet 1 mg  1 mg Oral Daily Paliy, Serina Cowper, MD   1 mg at 04/28/22 0810   haloperidol (HALDOL) tablet 10 mg  10 mg Oral QHS Nkwenti, Tyler Aas, NP   10 mg at 04/27/22 2042   hydrOXYzine (ATARAX) tablet 25 mg  25 mg Oral TID PRN Starleen Blue, NP   25 mg at 04/28/22 0610   irbesartan (AVAPRO) tablet 75 mg  75 mg Oral Daily Starleen Blue, NP   75 mg at 04/28/22 0809   magnesium hydroxide (MILK OF MAGNESIA) suspension 30 mL  30 mL Oral Daily PRN Lauree Chandler, NP   30  mL at 04/26/22 1830   OLANZapine zydis (ZYPREXA) disintegrating tablet 5 mg  5 mg Oral Q8H PRN Lauree Chandler, NP   5 mg at 04/16/22 2351   propranolol (INDERAL) tablet 10 mg  10 mg Oral BID Starleen Blue, NP   10 mg at 04/28/22 0809   senna-docusate (Senokot-S) tablet 1 tablet  1 tablet Oral QHS Starleen Blue, NP   1 tablet at 04/27/22 2043   sertraline (ZOLOFT) tablet 100 mg  100 mg Oral Daily Starleen Blue, NP   100 mg at 04/28/22 0809   traZODone (DESYREL) tablet 100 mg  100 mg Oral QHS Starleen Blue, NP   100 mg at 04/27/22 2042   PTA Medications: Medications Prior to Admission  Medication Sig Dispense Refill Last Dose   ALPRAZolam (XANAX) 1 MG tablet Take 1 mg by mouth 3 (three) times daily.      cloNIDine (CATAPRES) 0.2 MG tablet Take 0.5 tablets (0.1 mg total) by mouth 2 (two) times daily. (Patient taking differently: Take 0.2 mg by mouth 2 (two) times daily.)       Patient Stressors: Loss of husband    Patient Strengths: Average or above average intelligence   Treatment Modalities: Medication Management, Group therapy, Case management,  1  to 1 session with clinician, Psychoeducation, Recreational therapy.   Physician Treatment Plan for Primary Diagnosis: Major depressive disorder, single episode with psychotic features (HCC) Long Term Goal(s): Improvement in symptoms so as ready for discharge   Short Term Goals: Ability to identify changes in lifestyle to reduce recurrence of condition will improve Ability to verbalize feelings will improve Ability to disclose and discuss suicidal ideas Ability to demonstrate self-control will improve Ability to identify and develop effective coping behaviors will improve Ability to maintain clinical measurements within normal limits will improve Compliance with prescribed medications will improve Ability to identify triggers associated with substance abuse/mental health issues will improve  Medication Management: Evaluate  patient's response, side effects, and tolerance of medication regimen.  Therapeutic Interventions: 1 to 1 sessions, Unit Group sessions and Medication administration.  Evaluation of Outcomes: Progressing  Physician Treatment Plan for Secondary Diagnosis: Principal Problem:   Major depressive disorder, single episode with psychotic features (HCC) Active Problems:   Constipation   Generalized anxiety disorder   Insomnia  Long Term Goal(s): Improvement in symptoms so as ready for discharge   Short Term Goals: Ability to identify changes in lifestyle to reduce recurrence of condition will improve Ability to verbalize feelings will improve Ability to disclose and discuss suicidal ideas Ability to demonstrate self-control will improve Ability to identify and develop effective coping behaviors will improve Ability to maintain clinical measurements within normal limits will improve Compliance with prescribed medications will improve Ability to identify triggers associated with substance abuse/mental health issues will improve     Medication Management: Evaluate patient's response, side effects, and tolerance of medication regimen.  Therapeutic Interventions: 1 to 1 sessions, Unit Group sessions and Medication administration.  Evaluation of Outcomes: Progressing   RN Treatment Plan for Primary Diagnosis: Major depressive disorder, single episode with psychotic features (HCC) Long Term Goal(s): Knowledge of disease and therapeutic regimen to maintain health will improve  Short Term Goals: Ability to remain free from injury will improve, Ability to verbalize frustration and anger appropriately will improve, Ability to demonstrate self-control, Ability to participate in decision making will improve, Ability to verbalize feelings will improve, Ability to disclose and discuss suicidal ideas, Ability to identify and develop effective coping behaviors will improve, and Compliance with prescribed  medications will improve  Medication Management: RN will administer medications as ordered by provider, will assess and evaluate patient's response and provide education to patient for prescribed medication. RN will report any adverse and/or side effects to prescribing provider.  Therapeutic Interventions: 1 on 1 counseling sessions, Psychoeducation, Medication administration, Evaluate responses to treatment, Monitor vital signs and CBGs as ordered, Perform/monitor CIWA, COWS, AIMS and Fall Risk screenings as ordered, Perform wound care treatments as ordered.  Evaluation of Outcomes: Progressing   LCSW Treatment Plan for Primary Diagnosis: Major depressive disorder, single episode with psychotic features (HCC) Long Term Goal(s): Safe transition to appropriate next level of care at discharge, Engage patient in therapeutic group addressing interpersonal concerns.  Short Term Goals: Engage patient in aftercare planning with referrals and resources, Increase social support, Increase ability to appropriately verbalize feelings, Increase emotional regulation, Facilitate acceptance of mental health diagnosis and concerns, Facilitate patient progression through stages of change regarding substance use diagnoses and concerns, Identify triggers associated with mental health/substance abuse issues, and Increase skills for wellness and recovery  Therapeutic Interventions: Assess for all discharge needs, 1 to 1 time with Social worker, Explore available resources and support systems, Assess for adequacy in community support network, Educate family  and significant other(s) on suicide prevention, Complete Psychosocial Assessment, Interpersonal group therapy.  Evaluation of Outcomes: Progressing   Progress in Treatment: Attending groups: Yes. Participating in groups: Yes. Taking medication as prescribed: Yes. Toleration medication: Yes. Family/Significant other contact made: Yes, individual(s) contacted:   Cecile Hearing,  [phone] Patient understands diagnosis: No. Discussing patient identified problems/goals with staff: Yes. Medical problems stabilized or resolved: Yes. Denies suicidal/homicidal ideation: No. Issues/concerns per patient self-inventory: Yes. Other: none  New problem(s) identified: No, Describe:  none  New Short Term/Long Term Goal(s): Patient to work towards elimination of symptoms of psychosis, medication management for mood stabilization;development of comprehensive mental wellness plan.  Patient Goals: No additional goals identified at this time. Patient to continue to work towards original goals identified in initial treatment team meeting. CSW will remain available to patient should they voice additional treatment goals.   Discharge Plan or Barriers: No psychosocial barriers identified at this time, patient to return to place of residence when appropriate for discharge.   Reason for Continuation of Hospitalization: Depression Medication stabilization Other; describe psychosis   Estimated Length of Stay: 1-7 days   Last 3 [place] Suicide Severity Risk Score: [place] Admission (Current) from 04/11/2022 in [facility] Most recent reading at 04/11/2022  6:00 PM ED from 04/11/2022 in [facility] Most recent reading at 04/11/2022 11:51 AM ED from 03/16/2022 in [facility] Most recent reading at 03/16/2022 12:55 AM  C-SSRS RISK CATEGORY High Risk High Risk No Risk      Scribe for Treatment Team: Larose Kells 04/28/2022 9:20 AM

## 2022-09-03 ENCOUNTER — Encounter: Payer: Self-pay | Admitting: *Deleted

## 2022-09-03 ENCOUNTER — Encounter: Payer: Self-pay | Admitting: Gastroenterology

## 2022-09-03 ENCOUNTER — Ambulatory Visit (INDEPENDENT_AMBULATORY_CARE_PROVIDER_SITE_OTHER): Payer: Medicaid Other | Admitting: Gastroenterology

## 2022-09-03 VITALS — BP 128/80 | HR 82 | Temp 97.4°F | Ht 62.0 in | Wt 102.8 lb

## 2022-09-03 DIAGNOSIS — K869 Disease of pancreas, unspecified: Secondary | ICD-10-CM

## 2022-09-03 DIAGNOSIS — R1013 Epigastric pain: Secondary | ICD-10-CM | POA: Diagnosis not present

## 2022-09-03 DIAGNOSIS — R933 Abnormal findings on diagnostic imaging of other parts of digestive tract: Secondary | ICD-10-CM | POA: Diagnosis not present

## 2022-09-03 MED ORDER — OMEPRAZOLE 40 MG PO CPDR: 40.0000 mg | DELAYED_RELEASE_CAPSULE | Freq: Two times a day (BID) | ORAL | 3 refills | Status: DC

## 2022-09-03 MED ORDER — SUCRALFATE 1 G PO TABS: 1.0000 g | ORAL_TABLET | Freq: Three times a day (TID) | ORAL | 3 refills | Status: DC

## 2022-09-03 NOTE — Progress Notes (Signed)
Referring Provider: Adaline Sill, NP Primary Care Physician:  Adaline Sill, NP Primary GI Physician: Dr. Abbey Chatters  Chief Complaint  Patient presents with   Abdominal Pain    Gastritis stomach hurts really bad.     HPI:   Marie Diaz is a 57 y.o. female with history of chronic abdominal pain, weight loss with multiple prior ED presentations for abdominal pain with negative evaluations.  EGD August 2022 with gastritis and pathology with reactive gastropathy, negative for H. pylori.  CTA abdomen/pelvis with patent mesenteric vasculature in September 2022.  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  Previously evaluated by psychology during hospital admission and felt that she likely had psychosomatic component to her abdominal pain symptoms and started on low-dose dicyclomine and amitriptyline.  Last seen in our office 03/04/2022.  Complained of constant pain/burning in the epigastric region radiating across upper abdomen.  Pain was worse in right upper abdomen.  Intermittent nausea/vomiting.  Unable to eat.  Her only relief came at nighttime when she was sleeping.  She was taking Xanax at bedtime.  Had not tried Xanax during the day to see if this helped with the pain.  No improvement with Mylanta.  Bentyl not helpful so she had discontinued this.  She was also not taking amitriptyline.  She was down to 100 pounds.  Denied NSAIDs or overt GI bleeding.  Plan to update labs and arrange an EGD, increase pantoprazole to twice daily.   CBC, CMP, lipase with no acute findings.  Viscous lidocaine was prescribed in the latter part of September 2023 due to patient calling in complaining of burning in the upper abdomen.  She no showed for her EGD.  She was most recently seen in the emergency room 08/30/2022.  CBC, CMP, lipase without significant abnormality.  CT A/P without contrast with subtle stranding adjacent to the ascending colon,  nonspecific but could be related to an infectious/inflammatory colitis.  She was prescribed 10-day course of Cipro and Flagyl.   Today:  Burning in the epigastric area. Constant.  Only gets relief when she goes to sleep.  Burning radiates to her back. No nausea or vomiting. No heartburn. Started pepcid on 3/2. Had been off PPI for a while. Ran out and didn't get it refilled.  Did not feel that PPI helped. Viscous lidocaine previously unhelpful. No aggravating or relieving factors. Not affected by meals.  Bowels are moving well without constipation or diarrhea.  No BRBPR or melena.  No NSAIDs. She does have early satiety.  Weight has been stable recently.  No Fhx gastric or colon cancer.  No prior colonoscopy. Wants to hold off.   Pancreatic lesion- overdue for MRI. Wants to hold off.   Past Medical History:  Diagnosis Date   Anxiety    Depression    Gastric ulcer    Gastritis    Hypertension     Past Surgical History:  Procedure Laterality Date   ABDOMINAL HYSTERECTOMY     BIOPSY  08/29/2019   Procedure: BIOPSY;  Surgeon: Danie Binder, MD;  Location: [facility];  Service: Endoscopy;;   BIOPSY  02/26/2021   Procedure: BIOPSY;  Surgeon: Eloise Harman, DO;  Location: [facility];  Service: Endoscopy;;   CHOLECYSTECTOMY     ESOPHAGOGASTRODUODENOSCOPY N/A 08/29/2019   normal esophagus, localized moderate inflammation with adherent blood, congestion, erosions, and friability on greater curvature of stomach. Biopsy with chronic gastritis,  negative H.pylori.    ESOPHAGOGASTRODUODENOSCOPY (EGD) WITH PROPOFOL N/A 02/26/2021   gastritis and pathology with reactive gastropathy and negative H.pylori   HEMORRHOID SURGERY      Current Outpatient Medications  Medication Sig Dispense Refill   ALPRAZolam (XANAX) 1 MG tablet Take 1 mg by mouth 3 (three) times daily as needed.     ciprofloxacin (CIPRO) 500 MG tablet Take by mouth.     famotidine (PEPCID) 20 MG tablet Take 1 tablet (20  mg total) by mouth daily.     metroNIDAZOLE (FLAGYL) 500 MG tablet Take by mouth.     omeprazole (PRILOSEC) 40 MG capsule Take 1 capsule (40 mg total) by mouth 2 (two) times daily before a meal. 60 capsule 3   promethazine (PHENERGAN) 25 MG tablet Take by mouth.     sucralfate (CARAFATE) 1 g tablet Take 1 tablet (1 g total) by mouth 4 (four) times daily -  with meals and at bedtime. 120 tablet 3   amLODipine (NORVASC) 10 MG tablet Take 1 tablet (10 mg total) by mouth daily. 30 tablet 0   No current facility-administered medications for this visit.    Allergies as of 09/03/2022 - Review Complete 09/03/2022  Allergen Reaction Noted   Tetracyclines & related Other (See Comments) 11/07/2015   Ultram [tramadol] Other (See Comments) 08/01/2012    Family History  Problem Relation Age of Onset   Colon cancer Neg Hx    Colon polyps Neg Hx     Social History   Socioeconomic History   Marital status: Married    Spouse name: Not on file   Number of children: Not on file   Years of education: Not on file   Highest education level: Not on file  Occupational History   Not on file  Tobacco Use   Smoking status: Every Day    Packs/day: 0.50    Types: Cigarettes   Smokeless tobacco: Never  Vaping Use   Vaping Use: Former  Substance and Sexual Activity   Alcohol use: No   Drug use: No   Sexual activity: Yes  Other Topics Concern   Not on file  Social History Narrative   [phone]), kids(2). 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 SHE'S AN ONLY CHILD.   Social Determinants of Health   Financial Resource Strain: Not on file  Food Insecurity: Not on file  Transportation Needs: Not on file  Physical Activity: Not on file  Stress: Not on file  Social Connections: Not on file    Review of Systems: Gen: Denies fever, chills, cold or flulike symptoms, presyncope, syncope. CV: Denies chest pain, palpitations. Resp: Denies dyspnea,  cough. GI: See HPI Heme: See HPI  Physical Exam: BP 128/80 (BP Location: Right Arm, Patient Position: Sitting, Cuff Size: Normal)   Pulse 82   Temp (!) 97.4 F (36.3 C) (Temporal)   Ht '5\' 2"'$  (1.575 m)   Wt 102 lb 12.8 oz (46.6 kg)   SpO2 99%   BMI 18.80 kg/m  General:   Alert and oriented. No distress noted. Pleasant and cooperative.  Head:  Normocephalic and atraumatic. Eyes:  Conjuctiva clear without scleral icterus. Heart:  S1, S2 present without murmurs appreciated. Lungs:  Clear to auscultation bilaterally. No wheezes, rales, or rhonchi. No distress.  Abdomen:  +BS, soft, non-tender and non-distended. No rebound or guarding. No HSM or masses noted. Msk:  Symmetrical without gross deformities. Normal posture. Extremities:  Without  edema. Neurologic:  Alert and  oriented x4 Psych:  Normal mood and affect.    Assessment:  57 year old female with history of HTN, depression, anxiety, chronic abdominal pain, presenting today for further evaluation of epigastric burning.  Epigastric burning: Chronic, constant epigastric burning without identified aggravating or relieving factor.  Denies improvement with PPI twice daily or viscous lidocaine.  Has been off PPI for quite some time and recently started famotidine.  Last EGD in August 2022 with gastritis, pathology with reactive gastropathy, negative for H. pylori.  She denies NSAID use.  Prior CTA A/P in September 2022 with patent mesenteric vasculature.  She had been losing weight, but weight has been stable recently.  Admits to some early satiety without nausea or vomiting.  Also with known history of pancreatic lesion though this was not felt to be the cause of her abdominal pain previously.  Recent CT A/P without contrast 08/30/2022 with subtle stranding adjacent to the ascending colon, otherwise no acute findings.  No pancreatic lesion noted on this CT.  Etiology of her symptoms is not entirely clear.  Unable to rule out gastritis,  duodenitis, PUD, H. pylori.  There could be a component of gastroparesis as she reports early satiety.  Will restart PPI twice daily, start Carafate, and arrange an EGD in the near future.  Abnormal CT of the colon: CT A/P without contrast 08/30/2022 with subtle stranding adjacent to the ascending colon, nonspecific but could be related to an infectious/inflammatory colitis.  She was prescribed 10-day course of Cipro and Flagyl.  She denies any right-sided abdominal pain or diarrhea.  She continues with her chronic epigastric burning that is unchanged.  Advised that she complete her course of antibiotics.  She will need a colonoscopy at some point in the near future but is requesting to hold off on this for now.  Notably, she has never had a colonoscopy.  No family history of colon cancer.  Pancreatic lesion: Previously with pancreatic lesion seen on CT.  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  Had recommended 74-monthfollow-up, but this was never completed.  Patient is again requesting to hold off on MRI.  Of note, recent CT A/P without contrast on 08/30/2022 and prior CT A/P with IV contrast in August 2023 without mention of pancreatic lesion.   Plan:  Proceed with upper endoscopy with propofol by Dr. CAbbey Chattersin near future. The risks, benefits, and alternatives have been discussed with the patient in detail. The patient states understanding and desires to proceed.  ASA 2 Start omeprazole 40 mg twice daily 30 minutes before breakfast and dinner. Start Carafate 1 g 3 times daily before meals and at bedtime. Complete course of biotics prescribed by the emergency room. She will need a colonoscopy at some point in the near future.  Patient requesting to hold off on this for now. Follow-up after EGD.   KAliene Altes PA-C [facility] 09/03/2022

## 2022-09-03 NOTE — Patient Instructions (Signed)
We will arrange for you to have an upper endoscopy in the near future with Dr. Abbey Chatters.  Start omeprazole 40 mg twice daily 30 minutes before breakfast and dinner.  Start Carafate 1 g 3 times daily before meals and at bedtime.  Complete the course of antibiotics that the emergency room prescribed for you.  You will need a colonoscopy at some point in the near future to follow-up on the abnormal CT scan of your colon.  As requested, we will hold off on this until your upper endoscopy has been completed.  You are also overdue for an MRI to follow-up on the lesion on your pancreas.  We will hold off on scheduling this for now as you requested.  We can discuss again at your follow-up.  It was very nice to meet you today!  We will plan to see you back after your upper endoscopy.  Aliene Altes, PA-C [facility]

## 2022-09-09 ENCOUNTER — Encounter (HOSPITAL_COMMUNITY): Admission: RE | Payer: Self-pay | Source: Home / Self Care

## 2022-09-09 ENCOUNTER — Telehealth (INDEPENDENT_AMBULATORY_CARE_PROVIDER_SITE_OTHER): Payer: Self-pay | Admitting: *Deleted

## 2022-09-09 ENCOUNTER — Ambulatory Visit (HOSPITAL_COMMUNITY): Admission: RE | Admit: 2022-09-09 | Payer: Medicaid Other | Source: Home / Self Care

## 2022-09-09 SURGERY — ESOPHAGOGASTRODUODENOSCOPY (EGD) WITH PROPOFOL
Anesthesia: Monitor Anesthesia Care

## 2022-09-09 NOTE — Telephone Encounter (Signed)
Pt left voicemail stating she had a missed call from this number. Attempted to contact pt back at [phone] but no answer and no voicemail set up at this time.

## 2022-09-09 NOTE — Telephone Encounter (Signed)
Patient has not shown up for procedure scheduled for today. Endo not able to reach pt either.  Called pt mother and she stated patient probably forgot about the procedure. She updated pt phone #'s mobile [phone], house# [phone]. I called pt at both #'s, no answer and not able to leave VM  Per Dr. Abbey Chatters, pt can be rescheduled but if she does not show up for next procedure she will be discharged.

## 2022-09-10 NOTE — Telephone Encounter (Signed)
Called pt and spoke with daughter. She stated pt not available but will have her call us back

## 2022-09-11 NOTE — Telephone Encounter (Signed)
Letter mailed for pt to call us

## 2022-11-06 ENCOUNTER — Emergency Department (HOSPITAL_COMMUNITY)
Admission: EM | Admit: 2022-11-06 | Discharge: 2022-11-06 | Disposition: A | Discharge status: Home / Self Care | Payer: Medicaid Other | Attending: Emergency Medicine | Admitting: Emergency Medicine

## 2022-11-06 ENCOUNTER — Other Ambulatory Visit: Payer: Self-pay

## 2022-11-06 ENCOUNTER — Emergency Department (HOSPITAL_COMMUNITY): Payer: Medicaid Other

## 2022-11-06 ENCOUNTER — Encounter (HOSPITAL_COMMUNITY): Payer: Self-pay | Admitting: Emergency Medicine

## 2022-11-06 DIAGNOSIS — R0789 Other chest pain: Secondary | ICD-10-CM | POA: Insufficient documentation

## 2022-11-06 DIAGNOSIS — F17200 Nicotine dependence, unspecified, uncomplicated: Secondary | ICD-10-CM | POA: Diagnosis not present

## 2022-11-06 DIAGNOSIS — R079 Chest pain, unspecified: Secondary | ICD-10-CM

## 2022-11-06 LAB — CBC WITH DIFFERENTIAL/PLATELET
Abs Immature Granulocytes: 0.06 10*3/uL (ref 0.00–0.07)
Basophils Absolute: 0.1 10*3/uL (ref 0.0–0.1)
Basophils Relative: 1 %
Eosinophils Absolute: 0.1 10*3/uL (ref 0.0–0.5)
Eosinophils Relative: 1 %
HCT: 45.8 % (ref 36.0–46.0)
Hemoglobin: 16.2 g/dL — ABNORMAL HIGH (ref 12.0–15.0)
Immature Granulocytes: 1 %
Lymphocytes Relative: 41 %
Lymphs Abs: 4.9 10*3/uL — ABNORMAL HIGH (ref 0.7–4.0)
MCH: 31.5 pg (ref 26.0–34.0)
MCHC: 35.4 g/dL (ref 30.0–36.0)
MCV: 89.1 fL (ref 80.0–100.0)
Monocytes Absolute: 1 10*3/uL (ref 0.1–1.0)
Monocytes Relative: 8 %
Neutro Abs: 5.7 10*3/uL (ref 1.7–7.7)
Neutrophils Relative %: 48 %
Platelets: 290 10*3/uL (ref 150–400)
RBC: 5.14 MIL/uL — ABNORMAL HIGH (ref 3.87–5.11)
RDW: 12.6 % (ref 11.5–15.5)
WBC: 11.8 10*3/uL — ABNORMAL HIGH (ref 4.0–10.5)
nRBC: 0 % (ref 0.0–0.2)

## 2022-11-06 LAB — D-DIMER, QUANTITATIVE: D-Dimer, Quant: 0.44 ug/mL-FEU (ref 0.00–0.50)

## 2022-11-06 LAB — PROTIME-INR
INR: 1.1 (ref 0.8–1.2)
Prothrombin Time: 14.8 seconds (ref 11.4–15.2)

## 2022-11-06 LAB — BASIC METABOLIC PANEL
Anion gap: 12 (ref 5–15)
BUN: 16 mg/dL (ref 6–20)
CO2: 23 mmol/L (ref 22–32)
Calcium: 8.7 mg/dL — ABNORMAL LOW (ref 8.9–10.3)
Chloride: 106 mmol/L (ref 98–111)
Creatinine, Ser: 1.06 mg/dL — ABNORMAL HIGH (ref 0.44–1.00)
GFR, Estimated: >60 mL/min (ref >60)
Glucose, Bld: 124 mg/dL — ABNORMAL HIGH (ref 70–99)
Potassium: 4.2 mmol/L (ref 3.5–5.1)
Sodium: 141 mmol/L (ref 135–145)

## 2022-11-06 LAB — TROPONIN I (HIGH SENSITIVITY): Troponin I (High Sensitivity): 7 ng/L (ref <18)

## 2022-11-06 MED ORDER — MORPHINE SULFATE (PF) 4 MG/ML IV SOLN: 4.0000 mg | Freq: Once | INTRAVENOUS | Status: DC

## 2022-11-06 MED ORDER — ACETAMINOPHEN 500 MG PO TABS
1000.0000 mg | ORAL_TABLET | Freq: Once | ORAL | Status: AC
  Administered 2022-11-06: 1000 mg via ORAL
  Filled 2022-11-06: qty 2

## 2022-11-06 NOTE — ED Provider Notes (Signed)
[place] EMERGENCY DEPARTMENT AT [facility] Provider Note   CSN: [id number] Arrival date & time: 11/06/22  2002     History Chief Complaint  Patient presents with   Chest Pain    HPI Marie Diaz is a 57 y.o. female presenting for chief complaint of chest pain.  57 year old female with a extensive medical history.  States that she has been smoking frequently and had a dry cough for last 4 days.  She had a cough caused a severe left-sided episode of chest pain earlier today.  It is now improving on arrival but relatively severe initially.  Denies fevers chills nausea vomiting syncope or shortness of breath.  No history of heart disease.  Her sister hypertension.  Is otherwise compliant with her medical therapy and follows with her primary care provider..   Patient's recorded medical, surgical, social, medication list and allergies were reviewed in the Snapshot window as part of the initial history.   Review of Systems   Review of Systems  Constitutional:  Negative for chills and fever.  HENT:  Negative for ear pain and sore throat.   Eyes:  Negative for pain and visual disturbance.  Respiratory:  Positive for cough. Negative for shortness of breath.   Cardiovascular:  Positive for chest pain. Negative for palpitations.  Gastrointestinal:  Negative for abdominal pain and vomiting.  Genitourinary:  Negative for dysuria and hematuria.  Musculoskeletal:  Negative for arthralgias and back pain.  Skin:  Negative for color change and rash.  Neurological:  Negative for seizures and syncope.  All other systems reviewed and are negative.   Physical Exam Updated Vital Signs BP 106/68   Pulse 85   Temp 98.5 F (36.9 C) (Oral)   Resp 16   Ht 5\' 2"  (1.575 m)   Wt 49.9 kg   SpO2 99%   BMI 20.12 kg/m  Physical Exam Vitals and nursing note reviewed.  Constitutional:      General: She is not in acute distress.    Appearance: She is well-developed.  HENT:     Head:  Normocephalic and atraumatic.  Eyes:     Conjunctiva/sclera: Conjunctivae normal.  Cardiovascular:     Rate and Rhythm: Normal rate and regular rhythm.     Heart sounds: No murmur heard. Pulmonary:     Effort: Pulmonary effort is normal. No respiratory distress.     Breath sounds: Normal breath sounds.  Chest:     Chest wall: Tenderness present.  Abdominal:     General: There is no distension.     Palpations: Abdomen is soft.     Tenderness: There is no abdominal tenderness. There is no right CVA tenderness or left CVA tenderness.  Musculoskeletal:        General: No swelling or tenderness. Normal range of motion.     Cervical back: Neck supple.  Skin:    General: Skin is warm and dry.  Neurological:     General: No focal deficit present.     Mental Status: She is alert and oriented to person, place, and time. Mental status is at baseline.     Cranial Nerves: No cranial nerve deficit.      ED Course/ Medical Decision Making/ A&P    Procedures Procedures   Medications Ordered in ED Medications  morphine (PF) 4 MG/ML injection 4 mg (has no administration in time range)  acetaminophen (TYLENOL) tablet 1,000 mg (1,000 mg Oral Given 11/06/22 2131)   Medical Decision Making: [facility]  Hukill is a 57 y.o. female who presented to the ED today with chest pain, detailed above.  Based on patient's comorbidities, patient has a heart score of 3.    Handoff received from EMS.  Patient's presentation is complicated by their history of multiple comorbid medical problems.  Patient placed on continuous vitals and telemetry monitoring while in ED which was reviewed periodically.  Complete initial physical exam performed, notably the patient was hemodynamically stable in no acute distress.  Initially patient is writhing in the bed.  Worse with palpation of her left chest..   Reviewed and confirmed nursing documentation for past medical history, family history, social history.    Initial  Assessment: With the patient's presentation of left-sided chest pain, most likely diagnosis is musculoskeletal chest pain versus GERD, although ACS remains on the differential. Other diagnoses were considered including (but not limited to) pulmonary embolism, community-acquired pneumonia, aortic dissection, pneumothorax, underlying bony abnormality, anemia. These are considered less likely due to history of present illness and physical exam findings.    In particular, concerning pulmonary embolism: deny malignancy, recent surgery, history of DVT, or calf tenderness leading to a low risk Wells score. This is most consistent with an acute life/limb threatening illness complicated by underlying chronic conditions.   Initial Plan: Evaluate for ACS with delta troponin and EKG evaluated as below  Evaluate for dissection, bony abnormality, or pneumonia with chest x-ray and screening laboratory evaluation including CBC, BMP  Further evaluation for pulmonary embolism indicated at this time based on patient's PERC and Wells score.  Further evaluation for Thoracic Aortic Dissection not indicated at this time based on patient's clinical history and PE findings.   Initial Study Results: EKG was reviewed independently. Rate, rhythm, axis, intervals all examined and without medically relevant abnormality. ST segments without concerns for elevations.    Laboratory  Initial troponin demonstrated   CBC and BMP without obvious metabolic or inflammatory abnormalities requiring further evaluation   Radiology  DG Chest Portable 1 View  Result Date: 11/06/2022 CLINICAL DATA:  Shortness of breath EXAM: PORTABLE CHEST 1 VIEW COMPARISON:  Chest radiograph 02/28/2021 FINDINGS: Hyperinflation. Stable cardiomediastinal silhouette. No focal consolidation, pleural effusion, or pneumothorax. No displaced rib fractures. IMPRESSION: No active disease.  Hyperinflation. Electronically Signed   By: Minerva Fester M.D.   On:  11/06/2022 21:02    Final Assessment and Plan: Patient's history of present illness and physical exam findings are most consistent musculoskeletal pathology.  Given duration of symptoms most likely nonspecific etiology.  Serial troponin not indicated given duration greater than 5 hours.  Resolution of symptoms on secondary evaluation and secondary interview.  Long conversation with patient regarding next steps in management.  Given resolution of symptoms she feels comfortable with outpatient care management.  She has a heart score 3 which could be safely followed up by her primary care provider.  Strict return precautions regarding interval recurrence or worsening patient feels comfortable outpatient care at this time.  Disposition:  I have considered need for hospitalization, however, considering all of the above, I believe this patient is stable for discharge at this time.  Patient/family educated about specific return precautions for given chief complaint and symptoms.  Patient/family educated about follow-up with PCP.     Patient/family expressed understanding of return precautions and need for follow-up. Patient spoken to regarding all imaging and laboratory results and appropriate follow up for these results. All education provided in verbal form with additional information in written form. Time was allowed for  answering of patient questions. Patient discharged.    Emergency Department Medication Summary:   Medications  morphine (PF) 4 MG/ML injection 4 mg (has no administration in time range)  acetaminophen (TYLENOL) tablet 1,000 mg (1,000 mg Oral Given 11/06/22 2131)        Clinical Impression:  1. Chest pain, unspecified type      Discharge   Final Clinical Impression(s) / ED Diagnoses Final diagnoses:  Chest pain, unspecified type    Rx / DC Orders ED Discharge Orders     None         Glyn Ade, MD 11/06/22 2328

## 2022-11-06 NOTE — ED Triage Notes (Signed)
Pt BIB [facility] from home. Pt c/o 10/10 central CP. Initial BP 180/150 and diaphoretic. Pt received 324mg  Asprin, 6mg  morphine,3 nitro, 4mg  zofran in route. Pt BP on arrival 100/60

## 2023-04-10 ENCOUNTER — Emergency Department (HOSPITAL_COMMUNITY)
Admission: EM | Admit: 2023-04-10 | Discharge: 2023-04-11 | Discharge status: Against Medical Advice | Payer: MEDICAID | Attending: Emergency Medicine | Admitting: Emergency Medicine

## 2023-04-10 ENCOUNTER — Encounter (HOSPITAL_COMMUNITY): Payer: Self-pay | Admitting: Emergency Medicine

## 2023-04-10 ENCOUNTER — Other Ambulatory Visit: Payer: Self-pay

## 2023-04-10 ENCOUNTER — Emergency Department (HOSPITAL_COMMUNITY): Payer: MEDICAID

## 2023-04-10 DIAGNOSIS — R110 Nausea: Secondary | ICD-10-CM | POA: Diagnosis not present

## 2023-04-10 DIAGNOSIS — R1013 Epigastric pain: Secondary | ICD-10-CM | POA: Diagnosis present

## 2023-04-10 DIAGNOSIS — R079 Chest pain, unspecified: Secondary | ICD-10-CM | POA: Diagnosis not present

## 2023-04-10 DIAGNOSIS — Z5321 Procedure and treatment not carried out due to patient leaving prior to being seen by health care provider: Secondary | ICD-10-CM | POA: Insufficient documentation

## 2023-04-10 LAB — COMPREHENSIVE METABOLIC PANEL
ALT: 14 U/L (ref 0–44)
AST: 17 U/L (ref 15–41)
Albumin: 4.7 g/dL (ref 3.5–5.0)
Alkaline Phosphatase: 82 U/L (ref 38–126)
Anion gap: 14 (ref 5–15)
BUN: 6 mg/dL (ref 6–20)
CO2: 21 mmol/L — ABNORMAL LOW (ref 22–32)
Calcium: 9.5 mg/dL (ref 8.9–10.3)
Chloride: 105 mmol/L (ref 98–111)
Creatinine, Ser: 0.89 mg/dL (ref 0.44–1.00)
GFR, Estimated: >60 mL/min (ref >60)
Glucose, Bld: 134 mg/dL — ABNORMAL HIGH (ref 70–99)
Potassium: 3.6 mmol/L (ref 3.5–5.1)
Sodium: 140 mmol/L (ref 135–145)
Total Bilirubin: 0.6 mg/dL (ref 0.3–1.2)
Total Protein: 7.7 g/dL (ref 6.5–8.1)

## 2023-04-10 LAB — CBC
HCT: 44.2 % (ref 36.0–46.0)
Hemoglobin: 15.5 g/dL — ABNORMAL HIGH (ref 12.0–15.0)
MCH: 31.8 pg (ref 26.0–34.0)
MCHC: 35.1 g/dL (ref 30.0–36.0)
MCV: 90.6 fL (ref 80.0–100.0)
Platelets: 374 10*3/uL (ref 150–400)
RBC: 4.88 MIL/uL (ref 3.87–5.11)
RDW: 12.2 % (ref 11.5–15.5)
WBC: 12.7 10*3/uL — ABNORMAL HIGH (ref 4.0–10.5)
nRBC: 0 % (ref 0.0–0.2)

## 2023-04-10 LAB — TROPONIN I (HIGH SENSITIVITY): Troponin I (High Sensitivity): 3 ng/L (ref <18)

## 2023-04-10 LAB — LIPASE, BLOOD: Lipase: 28 U/L (ref 11–51)

## 2023-04-10 MED ORDER — LIDOCAINE VISCOUS HCL 2 % MT SOLN
15.0000 mL | Freq: Once | OROMUCOSAL | Status: AC
  Administered 2023-04-10: 15 mL via ORAL
  Filled 2023-04-10: qty 15

## 2023-04-10 MED ORDER — ALUM & MAG HYDROXIDE-SIMETH 200-200-20 MG/5ML PO SUSP
30.0000 mL | Freq: Once | ORAL | Status: AC
  Administered 2023-04-10: 30 mL via ORAL
  Filled 2023-04-10: qty 30

## 2023-04-10 MED ORDER — ONDANSETRON 4 MG PO TBDP
4.0000 mg | ORAL_TABLET | Freq: Once | ORAL | Status: AC
  Administered 2023-04-10: 4 mg via ORAL
  Filled 2023-04-10: qty 1

## 2023-04-10 MED ORDER — DIAZEPAM 2 MG PO TABS
2.0000 mg | ORAL_TABLET | Freq: Once | ORAL | Status: AC
  Administered 2023-04-10: 2 mg via ORAL
  Filled 2023-04-10: qty 1

## 2023-04-10 NOTE — ED Notes (Signed)
Called pt x2, checked triage & outside for pt. Phlebotomist in triage called pt for repeat trop. No response.

## 2023-04-10 NOTE — ED Provider Triage Note (Signed)
Emergency Medicine Provider Triage Evaluation Note  Marie Diaz , a 57 y.o. female  was evaluated in triage.  Pt complains of severe epigastric abdominal pain and chest pain.  She has a longstanding history of the same.  Her daughter is here with her and states that she was supposed to follow-up for an EGD about a year ago but her husband died and she has not been handling life very well.  She has suspected ulcers.  She has pain every day.  She has been taking Maalox.  Her pain suddenly worsened last night.  She thinks it is due to chewing chewing gum.  Associated chest pain, nausea, dry heaving  Review of Systems  Positive: Epigastric abdominal pain and chest pain Negative: Fever  Physical Exam  BP (!) 141/126 (BP Location: Left Arm)   Pulse (!) 111   Temp 97.9 F (36.6 C) (Oral)   Resp (!) 42   SpO2 98%  Gen:   Awake, no distress   Resp:  Normal effort  MSK:   Moves extremities without difficulty  Other:  Grunting, crying and hyperventilating  Medical Decision Making  Medically screening exam initiated at 1:20 PM.  Appropriate orders placed.  JULINA ALTMANN was informed that the remainder of the evaluation will be completed by another provider, this initial triage assessment does not replace that evaluation, and the importance of remaining in the ED until their evaluation is complete.  Hx gastitis with negative H.pylori   Arthor Captain, PA-C 04/10/23 1322

## 2023-04-10 NOTE — ED Triage Notes (Signed)
PT grunting and crying in triage. Reports her stomach and chest feels like someone lit a fire in her chest. Pt hyperventilating. Pt has hx of anxiety. VSS.

## 2023-04-13 ENCOUNTER — Telehealth: Payer: Self-pay

## 2023-04-13 NOTE — Progress Notes (Unsigned)
Referring Provider: Rebekah Chesterfield, NP Primary Care Physician:  Rebekah Chesterfield, NP Primary GI Physician: Dr. Marletta Lor  No chief complaint on file.   HPI:   Marie Diaz is a 57 y.o. female with history of chronic abdominal pain, weight loss with multiple prior ED presentations for abdominal pain with largely negative evaluations. EGD August 2022 with gastritis and pathology with reactive gastropathy, negative for H. pylori. CTA abdomen/pelvis with patent mesenteric vasculature in September 2022. 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 Previously evaluated by psychology during hospital admission and felt that she likely had psychosomatic component to her abdominal pain symptoms and started on low-dose dicyclomine and amitriptyline. Later reported Bentyl not helpful. Stopped amitriptyline on her own, unclear if this medication had any effect on her symptoms.  She did have a CT in March 2024 while in the ER that showed subtle stranding adjacent to the ascending colon, possibly related to infectious/inflammatory colitis.  She was prescribed a 10-day course of Cipro and Flagyl though she did not have any typical of symptoms of colitis.  Last seen in the office 09/03/2022.  Reported constant epigastric burning.  Only getting relief when she went to sleep.  Burning radiating to her back.  Denied nausea, vomiting, heartburn.  She started Pepcid on 3/2.  She had been off of PPI for a while as she had run out and did not get it refilled.  Overall, did not feel that PPI was helpful.  Also stated viscous lidocaine previously and helpful.  No identified aggravating or relieving factors.  Denied association with meals.  She did report early satiety.  Her weight had recently been stable.  Recommended starting PPI twice daily, Carafate, EGD.  She was overdue for surveillance MRI due to pancreatic lesion and requested to hold off on this.  Also offered for  cervical anoscopy, but requested to hold off as well.  EGD was scheduled for 3/12, but patient no showed for procedure.  Patient called 10/14 stating she was ready to set up EGD.   In the interim, patient has had numerous ER visits primarily for atypical chest pain and/or suicidal ideations.  She has left the ER a few times AGAINST MEDICAL ADVICE.  Notably, she has also been hospitalized for mental crisis this year.  ER evaluations have been largely unremarkable.  Troponins have been negative.  No significant abnormalities on EKGs.  Past Medical History:  Diagnosis Date   Anxiety    Depression    Gastric ulcer    Gastritis    Hypertension     Past Surgical History:  Procedure Laterality Date   ABDOMINAL HYSTERECTOMY     BIOPSY  08/29/2019   Procedure: BIOPSY;  Surgeon: West Bali, MD;  Location: [facility];  Service: Endoscopy;;   BIOPSY  02/26/2021   Procedure: BIOPSY;  Surgeon: Lanelle Bal, DO;  Location: [facility];  Service: Endoscopy;;   CHOLECYSTECTOMY     ESOPHAGOGASTRODUODENOSCOPY N/A 08/29/2019   normal esophagus, localized moderate inflammation with adherent blood, congestion, erosions, and friability on greater curvature of stomach. Biopsy with chronic gastritis, negative H.pylori.    ESOPHAGOGASTRODUODENOSCOPY (EGD) WITH PROPOFOL N/A 02/26/2021   gastritis and pathology with reactive gastropathy and negative H.pylori   HEMORRHOID SURGERY      Current Outpatient Medications  Medication Sig Dispense Refill   ALPRAZolam (XANAX) 1 MG tablet Take 1 mg by mouth 3 (three) times daily as  needed for anxiety.     amLODipine (NORVASC) 10 MG tablet Take 1 tablet (10 mg total) by mouth daily. (Patient not taking: Reported on 11/06/2022) 30 tablet 0   cloNIDine (CATAPRES) 0.2 MG tablet Take 0.2 mg by mouth 2 (two) times daily.     famotidine (PEPCID) 20 MG tablet Take 1 tablet (20 mg total) by mouth daily. (Patient not taking: Reported on 11/06/2022)     omeprazole  (PRILOSEC) 40 MG capsule Take 1 capsule (40 mg total) by mouth 2 (two) times daily before a meal. (Patient not taking: Reported on 11/06/2022) 60 capsule 3   sucralfate (CARAFATE) 1 g tablet Take 1 tablet (1 g total) by mouth 4 (four) times daily -  with meals and at bedtime. (Patient not taking: Reported on 11/06/2022) 120 tablet 3   No current facility-administered medications for this visit.    Allergies as of 04/16/2023 - Unable to Assess 04/11/2023  Allergen Reaction Noted   Tetracyclines & related Other (See Comments) 11/07/2015   Ultram [tramadol] Other (See Comments) 08/01/2012    Family History  Problem Relation Age of Onset   Colon cancer Neg Hx    Colon polyps Neg Hx     Social History   Socioeconomic History   Marital status: Married    Spouse name: Not on file   Number of children: Not on file   Years of education: Not on file   Highest education level: Not on file  Occupational History   Not on file  Tobacco Use   Smoking status: Every Day    Current packs/day: 0.50    Types: Cigarettes   Smokeless tobacco: Never  Vaping Use   Vaping status: Former  Substance and Sexual Activity   Alcohol use: No   Drug use: No   Sexual activity: Yes  Other Topics Concern   Not on file  Social History Narrative   [phone]), kids(2). 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 SHE'S AN ONLY CHILD.   Social Determinants of Health   Financial Resource Strain: Not on file  Food Insecurity: Low Risk  (12/14/2022)   Received from [facility]   Food Insecurity    Within the past 12 months, did the food you bought just not last and you didn't have money to get more?: No    Within the past 12 months, did you worry that your food would run out before you got money to buy more?: No  Transportation Needs: Low Risk  (12/14/2022)   [facility]   Transportation Needs    Within the past 12 months,  has a lack of transportation kept you from medical appointments or from doing things needed for daily living?: No  Physical Activity: Not on file  Stress: Not on file  Social Connections: Not on file    Review of Systems: Gen: Denies fever, chills, anorexia. Denies fatigue, weakness, weight loss.  CV: Denies chest pain, palpitations, syncope, peripheral edema, and claudication. Resp: Denies dyspnea at rest, cough, wheezing, coughing up blood, and pleurisy. GI: Denies vomiting blood, jaundice, and fecal incontinence.   Denies dysphagia or odynophagia. Derm: Denies rash, itching, dry skin Psych: Denies depression, anxiety, memory loss, confusion. No homicidal or suicidal ideation.  Heme: Denies bruising, bleeding, and enlarged lymph nodes.  Physical Exam: There were no vitals taken for this visit. General:   Alert and oriented. No distress noted. Pleasant and  cooperative.  Head:  Normocephalic and atraumatic. Eyes:  Conjuctiva clear without scleral icterus. Heart:  S1, S2 present without murmurs appreciated. Lungs:  Clear to auscultation bilaterally. No wheezes, rales, or rhonchi. No distress.  Abdomen:  +BS, soft, non-tender and non-distended. No rebound or guarding. No HSM or masses noted. Msk:  Symmetrical without gross deformities. Normal posture. Extremities:  Without edema. Neurologic:  Alert and  oriented x4 Psych:  Normal mood and affect.    Assessment:     Plan:  ***   Ermalinda Memos, PA-C [facility] 04/16/2023

## 2023-04-13 NOTE — Telephone Encounter (Signed)
Yes, OV will be needed.

## 2023-04-13 NOTE — Telephone Encounter (Signed)
Pt called wanting to set up an EGD, pt was last seen in March and never was scheduled. Does pt need to come back in for f/u to have that scheduled?

## 2023-04-13 NOTE — Telephone Encounter (Signed)
Pt was made aware and ov was made.

## 2023-04-13 NOTE — H&P (View-Only) (Signed)
Referring Provider: Rebekah Chesterfield, NP Primary Care Physician:  Rebekah Chesterfield, NP Primary GI Physician: Dr. Marletta Lor  No chief complaint on file.   HPI:   Marie Diaz is a 57 y.o. female with history of chronic abdominal pain, weight loss with multiple prior ED presentations for abdominal pain with largely negative evaluations. EGD August 2022 with gastritis and pathology with reactive gastropathy, negative for H. pylori. CTA abdomen/pelvis with patent mesenteric vasculature in September 2022. 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 Previously evaluated by psychology during hospital admission and felt that she likely had psychosomatic component to her abdominal pain symptoms and started on low-dose dicyclomine and amitriptyline. Later reported Bentyl not helpful. Stopped amitriptyline on her own, unclear if this medication had any effect on her symptoms.  She did have a CT in March 2024 while in the ER that showed subtle stranding adjacent to the ascending colon, possibly related to infectious/inflammatory colitis.  She was prescribed a 10-day course of Cipro and Flagyl though she did not have any typical of symptoms of colitis.  Last seen in the office 09/03/2022.  Reported constant epigastric burning.  Only getting relief when she went to sleep.  Burning radiating to her back.  Denied nausea, vomiting, heartburn.  She started Pepcid on 3/2.  She had been off of PPI for a while as she had run out and did not get it refilled.  Overall, did not feel that PPI was helpful.  Also stated viscous lidocaine previously and helpful.  No identified aggravating or relieving factors.  Denied association with meals.  She did report early satiety.  Her weight had recently been stable.  Recommended starting PPI twice daily, Carafate, EGD.  She was overdue for surveillance MRI due to pancreatic lesion and requested to hold off on this.  Also offered for  cervical anoscopy, but requested to hold off as well.  EGD was scheduled for 3/12, but patient no showed for procedure.  Patient called 10/14 stating she was ready to set up EGD.   In the interim, patient has had numerous ER visits primarily for atypical chest pain and/or suicidal ideations.  She has left the ER a few times AGAINST MEDICAL ADVICE.  Notably, she has also been hospitalized for mental crisis this year.  ER evaluations have been largely unremarkable.  Troponins have been negative.  No significant abnormalities on EKGs.  Past Medical History:  Diagnosis Date   Anxiety    Depression    Gastric ulcer    Gastritis    Hypertension     Past Surgical History:  Procedure Laterality Date   ABDOMINAL HYSTERECTOMY     BIOPSY  08/29/2019   Procedure: BIOPSY;  Surgeon: West Bali, MD;  Location: [facility];  Service: Endoscopy;;   BIOPSY  02/26/2021   Procedure: BIOPSY;  Surgeon: Lanelle Bal, DO;  Location: [facility];  Service: Endoscopy;;   CHOLECYSTECTOMY     ESOPHAGOGASTRODUODENOSCOPY N/A 08/29/2019   normal esophagus, localized moderate inflammation with adherent blood, congestion, erosions, and friability on greater curvature of stomach. Biopsy with chronic gastritis, negative H.pylori.    ESOPHAGOGASTRODUODENOSCOPY (EGD) WITH PROPOFOL N/A 02/26/2021   gastritis and pathology with reactive gastropathy and negative H.pylori   HEMORRHOID SURGERY      Current Outpatient Medications  Medication Sig Dispense Refill   ALPRAZolam (XANAX) 1 MG tablet Take 1 mg by mouth 3 (three) times daily as  needed for anxiety.     amLODipine (NORVASC) 10 MG tablet Take 1 tablet (10 mg total) by mouth daily. (Patient not taking: Reported on 11/06/2022) 30 tablet 0   cloNIDine (CATAPRES) 0.2 MG tablet Take 0.2 mg by mouth 2 (two) times daily.     famotidine (PEPCID) 20 MG tablet Take 1 tablet (20 mg total) by mouth daily. (Patient not taking: Reported on 11/06/2022)     omeprazole  (PRILOSEC) 40 MG capsule Take 1 capsule (40 mg total) by mouth 2 (two) times daily before a meal. (Patient not taking: Reported on 11/06/2022) 60 capsule 3   sucralfate (CARAFATE) 1 g tablet Take 1 tablet (1 g total) by mouth 4 (four) times daily -  with meals and at bedtime. (Patient not taking: Reported on 11/06/2022) 120 tablet 3   No current facility-administered medications for this visit.    Allergies as of 04/16/2023 - Unable to Assess 04/11/2023  Allergen Reaction Noted   Tetracyclines & related Other (See Comments) 11/07/2015   Ultram [tramadol] Other (See Comments) 08/01/2012    Family History  Problem Relation Age of Onset   Colon cancer Neg Hx    Colon polyps Neg Hx     Social History   Socioeconomic History   Marital status: Married    Spouse name: Not on file   Number of children: Not on file   Years of education: Not on file   Highest education level: Not on file  Occupational History   Not on file  Tobacco Use   Smoking status: Every Day    Current packs/day: 0.50    Types: Cigarettes   Smokeless tobacco: Never  Vaping Use   Vaping status: Former  Substance and Sexual Activity   Alcohol use: No   Drug use: No   Sexual activity: Yes  Other Topics Concern   Not on file  Social History Narrative   [phone]), kids(2). 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 SHE'S AN ONLY CHILD.   Social Determinants of Health   Financial Resource Strain: Not on file  Food Insecurity: Low Risk  (12/14/2022)   Received from [facility]   Food Insecurity    Within the past 12 months, did the food you bought just not last and you didn't have money to get more?: No    Within the past 12 months, did you worry that your food would run out before you got money to buy more?: No  Transportation Needs: Low Risk  (12/14/2022)   Received [facility]   Transportation Needs    Within the past 12 months,  has a lack of transportation kept you from medical appointments or from doing things needed for daily living?: No  Physical Activity: Not on file  Stress: Not on file  Social Connections: Not on file    Review of Systems: Gen: Denies fever, chills, anorexia. Denies fatigue, weakness, weight loss.  CV: Denies chest pain, palpitations, syncope, peripheral edema, and claudication. Resp: Denies dyspnea at rest, cough, wheezing, coughing up blood, and pleurisy. GI: Denies vomiting blood, jaundice, and fecal incontinence.   Denies dysphagia or odynophagia. Derm: Denies rash, itching, dry skin Psych: Denies depression, anxiety, memory loss, confusion. No homicidal or suicidal ideation.  Heme: Denies bruising, bleeding, and enlarged lymph nodes.  Physical Exam: There were no vitals taken for this visit. General:   Alert and oriented. No distress noted. Pleasant and  cooperative.  Head:  Normocephalic and atraumatic. Eyes:  Conjuctiva clear without scleral icterus. Heart:  S1, S2 present without murmurs appreciated. Lungs:  Clear to auscultation bilaterally. No wheezes, rales, or rhonchi. No distress.  Abdomen:  +BS, soft, non-tender and non-distended. No rebound or guarding. No HSM or masses noted. Msk:  Symmetrical without gross deformities. Normal posture. Extremities:  Without edema. Neurologic:  Alert and  oriented x4 Psych:  Normal mood and affect.    Assessment:     Plan:  ***   Ermalinda Memos, PA-C [facility] 04/16/2023

## 2023-04-14 NOTE — Telephone Encounter (Signed)
Noted  

## 2023-04-16 ENCOUNTER — Encounter: Payer: Self-pay | Admitting: *Deleted

## 2023-04-16 ENCOUNTER — Encounter: Payer: Self-pay | Admitting: Gastroenterology

## 2023-04-16 ENCOUNTER — Telehealth: Payer: Self-pay | Admitting: *Deleted

## 2023-04-16 ENCOUNTER — Ambulatory Visit (INDEPENDENT_AMBULATORY_CARE_PROVIDER_SITE_OTHER): Payer: MEDICAID | Admitting: Gastroenterology

## 2023-04-16 VITALS — BP 138/84 | HR 81 | Temp 98.8°F | Ht 63.0 in | Wt 107.4 lb

## 2023-04-16 DIAGNOSIS — K219 Gastro-esophageal reflux disease without esophagitis: Secondary | ICD-10-CM | POA: Diagnosis not present

## 2023-04-16 DIAGNOSIS — Z8719 Personal history of other diseases of the digestive system: Secondary | ICD-10-CM

## 2023-04-16 DIAGNOSIS — R1013 Epigastric pain: Secondary | ICD-10-CM | POA: Diagnosis not present

## 2023-04-16 DIAGNOSIS — Z1211 Encounter for screening for malignant neoplasm of colon: Secondary | ICD-10-CM

## 2023-04-16 DIAGNOSIS — K862 Cyst of pancreas: Secondary | ICD-10-CM

## 2023-04-16 MED ORDER — OMEPRAZOLE 40 MG PO CPDR
40.0000 mg | DELAYED_RELEASE_CAPSULE | Freq: Two times a day (BID) | ORAL | 3 refills | Status: DC
Start: 2023-04-16 — End: 2023-09-16

## 2023-04-16 MED ORDER — SUCRALFATE 1 G PO TABS
1.0000 g | ORAL_TABLET | Freq: Three times a day (TID) | ORAL | 3 refills | Status: DC
Start: 2023-04-16 — End: 2023-09-16

## 2023-04-16 NOTE — Telephone Encounter (Signed)
Pt was informed at [facility] today that if she no shows the procedures per Dr.Carver, she will be discharged from the practice. Pt verbalized understanding.

## 2023-04-16 NOTE — Telephone Encounter (Signed)
[employer] PA of MRI: Authorization Number: [id number] Case Number: [id number] Review Date: 04/16/2023 1:41:59 PM Expiration Date: 05/16/2023 Status: Your case has been Approved. The prior authorization you submitted, Case [id number], has been received. Additional case status notifications will be sent if you opted in for email notifications. Thank you

## 2023-04-16 NOTE — Patient Instructions (Addendum)
We will get you scheduled for a colonoscopy and upper endoscopy with Dr. Marletta Lor and even hospital in the near future.  Will also get you scheduled for an MRI to follow-up on the lesion in your pancreas.  Start omeprazole 40 mg twice daily 30 minutes before breakfast and dinner.  Start Carafate 3 times daily before meals and at bedtime.  We will see you back in the office after your procedures.  Ermalinda Memos, PA-C [facility]

## 2023-04-16 NOTE — Telephone Encounter (Signed)
LM with pt's son to have pt return call.

## 2023-04-16 NOTE — Telephone Encounter (Signed)
Tried calling home #-numerous rings no vm or am. Tried cell #- call can not be completed at this time. Tried calling pt's mom Ninfa Meeker on DPR: vm not set up  MRI/MRCP scheduled for 04/20/23, arrive at 8:45 am to check in at [facility], NPO x 4 hours.

## 2023-04-17 NOTE — Telephone Encounter (Signed)
Pt informed of MRI/MRCP appt date, time and instructions. Verbalized understanding.

## 2023-04-20 ENCOUNTER — Other Ambulatory Visit: Payer: Self-pay | Admitting: Gastroenterology

## 2023-04-20 ENCOUNTER — Ambulatory Visit (HOSPITAL_COMMUNITY)
Admission: RE | Admit: 2023-04-20 | Discharge: 2023-04-20 | Disposition: A | Discharge status: Home / Self Care | Payer: MEDICAID | Source: Ambulatory Visit | Attending: Gastroenterology | Admitting: Gastroenterology

## 2023-04-20 DIAGNOSIS — K862 Cyst of pancreas: Secondary | ICD-10-CM | POA: Diagnosis present

## 2023-04-20 MED ORDER — GADOBUTROL 1 MMOL/ML IV SOLN
5.0000 mL | Freq: Once | INTRAVENOUS | Status: AC | PRN
  Administered 2023-04-20: 5 mL via INTRAVENOUS

## 2023-04-21 ENCOUNTER — Other Ambulatory Visit: Payer: Self-pay

## 2023-04-21 ENCOUNTER — Encounter (HOSPITAL_COMMUNITY): Payer: Self-pay

## 2023-04-21 ENCOUNTER — Encounter (HOSPITAL_COMMUNITY)
Admission: RE | Disposition: A | Discharge status: Home / Self Care | Payer: Self-pay | Source: Home / Self Care | Attending: Internal Medicine

## 2023-04-21 ENCOUNTER — Ambulatory Visit (HOSPITAL_COMMUNITY): Payer: MEDICAID | Admitting: Anesthesiology

## 2023-04-21 ENCOUNTER — Ambulatory Visit (HOSPITAL_COMMUNITY)
Admission: RE | Admit: 2023-04-21 | Discharge: 2023-04-21 | Disposition: A | Discharge status: Home / Self Care | Payer: MEDICAID | Attending: Internal Medicine | Admitting: Internal Medicine

## 2023-04-21 DIAGNOSIS — K2950 Unspecified chronic gastritis without bleeding: Secondary | ICD-10-CM | POA: Insufficient documentation

## 2023-04-21 DIAGNOSIS — R1013 Epigastric pain: Secondary | ICD-10-CM | POA: Diagnosis not present

## 2023-04-21 DIAGNOSIS — K2970 Gastritis, unspecified, without bleeding: Secondary | ICD-10-CM

## 2023-04-21 DIAGNOSIS — F419 Anxiety disorder, unspecified: Secondary | ICD-10-CM | POA: Insufficient documentation

## 2023-04-21 DIAGNOSIS — Z1211 Encounter for screening for malignant neoplasm of colon: Secondary | ICD-10-CM | POA: Insufficient documentation

## 2023-04-21 DIAGNOSIS — K529 Noninfective gastroenteritis and colitis, unspecified: Secondary | ICD-10-CM | POA: Diagnosis not present

## 2023-04-21 DIAGNOSIS — I10 Essential (primary) hypertension: Secondary | ICD-10-CM | POA: Insufficient documentation

## 2023-04-21 DIAGNOSIS — Z87891 Personal history of nicotine dependence: Secondary | ICD-10-CM | POA: Insufficient documentation

## 2023-04-21 DIAGNOSIS — F32A Depression, unspecified: Secondary | ICD-10-CM | POA: Diagnosis not present

## 2023-04-21 DIAGNOSIS — Z79899 Other long term (current) drug therapy: Secondary | ICD-10-CM | POA: Diagnosis not present

## 2023-04-21 DIAGNOSIS — Z8711 Personal history of peptic ulcer disease: Secondary | ICD-10-CM | POA: Diagnosis not present

## 2023-04-21 HISTORY — PX: BIOPSY: SHX5522

## 2023-04-21 HISTORY — PX: ESOPHAGOGASTRODUODENOSCOPY (EGD) WITH PROPOFOL: SHX5813

## 2023-04-21 HISTORY — PX: COLONOSCOPY WITH PROPOFOL: SHX5780

## 2023-04-21 SURGERY — COLONOSCOPY WITH PROPOFOL
Anesthesia: General

## 2023-04-21 MED ORDER — PROPOFOL 500 MG/50ML IV EMUL
INTRAVENOUS | Status: DC | PRN
  Administered 2023-04-21: 150 ug/kg/min via INTRAVENOUS

## 2023-04-21 MED ORDER — LACTATED RINGERS IV SOLN: INTRAVENOUS | Status: DC | PRN

## 2023-04-21 MED ORDER — LIDOCAINE HCL (CARDIAC) PF 100 MG/5ML IV SOSY
PREFILLED_SYRINGE | INTRAVENOUS | Status: DC | PRN
  Administered 2023-04-21: 60 mg via INTRATRACHEAL

## 2023-04-21 MED ORDER — STERILE WATER FOR IRRIGATION IR SOLN
Status: DC | PRN
  Administered 2023-04-21: 60 mL

## 2023-04-21 MED ORDER — PROPOFOL 10 MG/ML IV BOLUS
INTRAVENOUS | Status: DC | PRN
  Administered 2023-04-21: 30 mg via INTRAVENOUS
  Administered 2023-04-21: 100 mg via INTRAVENOUS

## 2023-04-21 NOTE — Transfer of Care (Signed)
Immediate Anesthesia Transfer of Care Note  Patient: Nemesis Kovalsky Hilley  Procedure(s) Performed: COLONOSCOPY WITH PROPOFOL ESOPHAGOGASTRODUODENOSCOPY (EGD) WITH PROPOFOL BIOPSY  Patient Location: Endoscopy Unit  Anesthesia Type:General  Level of Consciousness: sedated  Airway & Oxygen Therapy: Patient Spontanous Breathing  Post-op Assessment: Report given to RN and Post -op Vital signs reviewed and stable  Post vital signs: Reviewed and stable  Last Vitals:  Vitals Value Taken Time  BP 130/80   Temp 98   Pulse 75   Resp 16   SpO2 98     Last Pain:  Vitals:   04/21/23 1238  TempSrc:   PainSc: 0-No pain      Patients Stated Pain Goal: 8 (04/21/23 1223)  Complications: No notable events documented.

## 2023-04-21 NOTE — Op Note (Signed)
[facility] Patient Name: Marie Diaz Procedure Date: 04/21/2023 11:44 AM MRN: [id number] Date of Birth: 03-25-1966 Attending MD: Hennie Duos. Marletta Lor , [place], [id number] CSN: [id number] Age: 57 Admit Type: Outpatient Procedure:                Upper GI endoscopy Indications:              Epigastric abdominal pain, Heartburn Providers:                Hennie Duos. Marletta Lor, DO, Crystal Page, Francoise Ceo                            RN, RN, Lennice Sites Technician, Pensions consultant Referring MD:              Medicines:                See the Anesthesia note for documentation of the                            administered medications Complications:            No immediate complications. Estimated Blood Loss:     Estimated blood loss was minimal. Procedure:                Pre-Anesthesia Assessment:                           - The anesthesia plan was to use monitored                            anesthesia care (MAC).                           After obtaining informed consent, the endoscope was                            passed under direct vision. Throughout the                            procedure, the patient's blood pressure, pulse, and                            oxygen saturations were monitored continuously. The                            GIF-H190 ([id number]) scope was introduced through the                            mouth, and advanced to the second part of duodenum.                            The upper GI endoscopy was accomplished without                            difficulty. The patient tolerated the procedure  well. Scope In: 12:41:55 PM Scope Out: 12:46:19 PM Total Procedure Duration: 0 hours 4 minutes 24 seconds  Findings:      The Z-line was regular.      Biopsies were taken with a cold forceps in the middle third of the       esophagus for histology.      Patchy mild inflammation characterized by erythema was found in the       gastric body. Biopsies were  taken with a cold forceps for Helicobacter       pylori testing.      The duodenal bulb, first portion of the duodenum and second portion of       the duodenum were normal. Impression:               - Z-line regular.                           - Gastritis. Biopsied.                           - Normal duodenal bulb, first portion of the                            duodenum and second portion of the duodenum.                           - Biopsies were taken with a cold forceps for                            histology in the middle third of the esophagus. Moderate Sedation:      Per Anesthesia Care Recommendation:           - Patient has a contact number available for                            emergencies. The signs and symptoms of potential                            delayed complications were discussed with the                            patient. Return to normal activities tomorrow.                            Written discharge instructions were provided to the                            patient.                           - Resume previous diet.                           - Continue present medications.                           - Await pathology results.                           -  Use a proton pump inhibitor PO BID.                           - No ibuprofen, naproxen, or other non-steroidal                            anti-inflammatory drugs.                           - Return to GI clinic in 3 months.                           - Await MRI results Procedure Code(s):        --- Professional ---                           [phone], Esophagogastroduodenoscopy, flexible,                            transoral; with biopsy, single or multiple Diagnosis Code(s):        --- Professional ---                           K29.70, Gastritis, unspecified, without bleeding                           R10.13, Epigastric pain                           R12, Heartburn CPT copyright 2022 American Medical  Association. All rights reserved. The codes documented in this report are preliminary and upon coder review may  be revised to meet current compliance requirements. Hennie Duos. Marletta Lor, DO Hennie Duos. Marletta Lor, DO 04/21/2023 12:48:59 PM This report has been signed electronically. Number of Addenda: 0

## 2023-04-21 NOTE — Discharge Instructions (Addendum)
EGD Discharge instructions Please read the instructions outlined below and refer to this sheet in the next few weeks. These discharge instructions provide you with general information on caring for yourself after you leave the hospital. Your doctor may also give you specific instructions. While your treatment has been planned according to the most current medical practices available, unavoidable complications occasionally occur. If you have any problems or questions after discharge, please call your doctor. ACTIVITY You may resume your regular activity but move at a slower pace for the next 24 hours.  Take frequent rest periods for the next 24 hours.  Walking will help expel (get rid of) the air and reduce the bloated feeling in your abdomen.  No driving for 24 hours (because of the anesthesia (medicine) used during the test).  You may shower.  Do not sign any important legal documents or operate any machinery for 24 hours (because of the anesthesia used during the test).  NUTRITION Drink plenty of fluids.  You may resume your normal diet.  Begin with a light meal and progress to your normal diet.  Avoid alcoholic beverages for 24 hours or as instructed by your caregiver.  MEDICATIONS You may resume your normal medications unless your caregiver tells you otherwise.  WHAT YOU CAN EXPECT TODAY You may experience abdominal discomfort such as a feeling of fullness or "gas" pains.  FOLLOW-UP Your doctor will discuss the results of your test with you.  SEEK IMMEDIATE MEDICAL ATTENTION IF ANY OF THE FOLLOWING OCCUR: Excessive nausea (feeling sick to your stomach) and/or vomiting.  Severe abdominal pain and distention (swelling).  Trouble swallowing.  Temperature over 101 F (37.8 C).  Rectal bleeding or vomiting of blood.      Colonoscopy Discharge Instructions  Read the instructions outlined below and refer to this sheet in the next few weeks. These discharge instructions provide you  with general information on caring for yourself after you leave the hospital. Your doctor may also give you specific instructions. While your treatment has been planned according to the most current medical practices available, unavoidable complications occasionally occur.   ACTIVITY You may resume your regular activity, but move at a slower pace for the next 24 hours.  Take frequent rest periods for the next 24 hours.  Walking will help get rid of the air and reduce the bloated feeling in your belly (abdomen).  No driving for 24 hours (because of the medicine (anesthesia) used during the test).   Do not sign any important legal documents or operate any machinery for 24 hours (because of the anesthesia used during the test).  NUTRITION Drink plenty of fluids.  You may resume your normal diet as instructed by your doctor.  Begin with a light meal and progress to your normal diet. Heavy or fried foods are harder to digest and may make you feel sick to your stomach (nauseated).  Avoid alcoholic beverages for 24 hours or as instructed.  MEDICATIONS You may resume your normal medications unless your doctor tells you otherwise.  WHAT YOU CAN EXPECT TODAY Some feelings of bloating in the abdomen.  Passage of more gas than usual.  Spotting of blood in your stool or on the toilet paper.  IF YOU HAD POLYPS REMOVED DURING THE COLONOSCOPY: No aspirin products for 7 days or as instructed.  No alcohol for 7 days or as instructed.  Eat a soft diet for the next 24 hours.  FINDING OUT THE RESULTS OF YOUR TEST Not all test results  are available during your visit. If your test results are not back during the visit, make an appointment with your caregiver to find out the results. Do not assume everything is normal if you have not heard from your caregiver or the medical facility. It is important for you to follow up on all of your test results.  SEEK IMMEDIATE MEDICAL ATTENTION IF: You have more than a  spotting of blood in your stool.  Your belly is swollen (abdominal distention).  You are nauseated or vomiting.  You have a temperature over 101.  You have abdominal pain or discomfort that is severe or gets worse throughout the day.   Your EGD revealed mild amount inflammation in your stomach.  I took biopsies of this to rule out infection with a bacteria called H. pylori.  I also took samples of your esophagus.   Small bowel appeared normal.  Continue on omeprazole twice daily.  Unfortunately, your colon was not adequately prepped today for colonoscopy.  I did not see any evidence of colon cancer or large polyps, but certainly could have missed smaller polyps due to poor visualization.  Would recommend repeat colonoscopy in 3-6 months with a different colon prep.  You did have evidence of inflammation in your colon/colitis.  I took samples of your colon.  We will call with these results.  Follow-up in GI office in 6 weeks.    I hope you have a great rest of your week!  Hennie Duos. Marletta Lor, D.O. Gastroenterology and Hepatology [facility]

## 2023-04-21 NOTE — Interval H&P Note (Signed)
History and Physical Interval Note:  04/21/2023 12:26 PM  Marie Diaz  has presented today for surgery, with the diagnosis of colon cancer screening,epigastric pain.  The various methods of treatment have been discussed with the patient and family. After consideration of risks, benefits and other options for treatment, the patient has consented to  Procedure(s) with comments: COLONOSCOPY WITH PROPOFOL (N/A) - 12:45 am, asa 2 ESOPHAGOGASTRODUODENOSCOPY (EGD) WITH PROPOFOL (N/A) as a surgical intervention.  The patient's history has been reviewed, patient examined, no change in status, stable for surgery.  I have reviewed the patient's chart and labs.  Questions were answered to the patient's satisfaction.     Lanelle Bal

## 2023-04-21 NOTE — Anesthesia Postprocedure Evaluation (Signed)
Anesthesia Post Note  Patient: Marie Diaz  Procedure(s) Performed: COLONOSCOPY WITH PROPOFOL ESOPHAGOGASTRODUODENOSCOPY (EGD) WITH PROPOFOL BIOPSY  Patient location during evaluation: PACU Anesthesia Type: General Level of consciousness: awake and alert Pain management: pain level controlled Vital Signs Assessment: post-procedure vital signs reviewed and stable Respiratory status: spontaneous breathing, nonlabored ventilation, respiratory function stable and patient connected to nasal cannula oxygen Cardiovascular status: blood pressure returned to baseline and stable Postop Assessment: no apparent nausea or vomiting Anesthetic complications: no   There were no known notable events for this encounter.   Last Vitals:  Vitals:   04/21/23 1223 04/21/23 1313  BP: (!) 167/80 111/72  Pulse: 75 77  Resp: 15 16  Temp: 36.8 C 36.7 C  SpO2: 98% 98%    Last Pain:  Vitals:   04/21/23 1313  TempSrc: Oral  PainSc: 0-No pain                 Ozie Lupe L Khyra Viscuso

## 2023-04-21 NOTE — Anesthesia Preprocedure Evaluation (Signed)
Anesthesia Evaluation  Patient identified by MRN, date of birth, ID band Patient awake    Reviewed: Allergy & Precautions, H&P , NPO status , Patient's Chart, lab work & pertinent test results, reviewed documented beta blocker date and time   Airway Mallampati: II  TM Distance: >3 FB Neck ROM: Full    Dental no notable dental hx. (+) Dental Advisory Given, Chipped   Pulmonary neg pulmonary ROS, Patient abstained from smoking., former smoker   Pulmonary exam normal breath sounds clear to auscultation       Cardiovascular Exercise Tolerance: Good hypertension, Pt. on medications Normal cardiovascular exam Rhythm:Regular Rate:Normal     Neuro/Psych  PSYCHIATRIC DISORDERS Anxiety Depression    negative neurological ROS     GI/Hepatic Neg liver ROS, PUD,,,  Endo/Other  negative endocrine ROS    Renal/GU negative Renal ROS  negative genitourinary   Musculoskeletal negative musculoskeletal ROS (+)    Abdominal   Peds  Hematology negative hematology ROS (+)   Anesthesia Other Findings   Reproductive/Obstetrics negative OB ROS                             Anesthesia Physical Anesthesia Plan  ASA: 2  Anesthesia Plan: General   Post-op Pain Management: Minimal or no pain anticipated   Induction: Intravenous  PONV Risk Score and Plan:   Airway Management Planned: Nasal Cannula and Natural Airway  Additional Equipment: None  Intra-op Plan:   Post-operative Plan:   Informed Consent: I have reviewed the patients History and Physical, chart, labs and discussed the procedure including the risks, benefits and alternatives for the proposed anesthesia with the patient or authorized representative who has indicated his/her understanding and acceptance.     Dental advisory given  Plan Discussed with: CRNA  Anesthesia Plan Comments:         Anesthesia Quick Evaluation

## 2023-04-21 NOTE — Op Note (Signed)
[facility] Patient Name: Marie Diaz Procedure Date: 04/21/2023 11:42 AM MRN: [id number] Date of Birth: [birth year] Attending MD: Hennie Duos. Marletta Lor , [place], [id number] CSN: [id number] Age: 57 Admit Type: Outpatient Procedure:                Colonoscopy Indications:              Screening for colorectal malignant neoplasm Providers:                Hennie Duos. Marletta Lor, DO, Emilee Tubb RN, RN, Crystal                            Page, Roseanne Kaufman, Pensions consultant Referring MD:              Medicines:                See the Anesthesia note for documentation of the                            administered medications Complications:            No immediate complications. Estimated Blood Loss:     Estimated blood loss was minimal. Procedure:                Pre-Anesthesia Assessment:                           - The anesthesia plan was to use monitored                            anesthesia care (MAC).                           After obtaining informed consent, the colonoscope                            was passed under direct vision. Throughout the                            procedure, the patient's blood pressure, pulse, and                            oxygen saturations were monitored continuously. The                            [id number]-HQ190L ([id number]) scope was introduced through                            the anus and advanced to the the terminal ileum,                            with identification of the appendiceal orifice and                            IC valve. The colonoscopy was performed without  difficulty. The patient tolerated the procedure                            well. The quality of the bowel preparation was                            evaluated using the BBPS [facility] Bowel Preparation                            Scale) with scores of: Right Colon = 2 (minor                            amount of residual staining, small fragments of                             stool and/or opaque liquid, but mucosa seen well),                            Transverse Colon = 1 (portion of mucosa seen, but                            other areas not well seen due to staining, residual                            stool and/or opaque liquid) and Left Colon = 1                            (portion of mucosa seen, but other areas not well                            seen due to staining, residual stool and/or opaque                            liquid). The total BBPS score equals 4. The quality                            of the bowel preparation was inadequate. Scope In: 12:50:23 PM Scope Out: 1:06:25 PM Scope Withdrawal Time: 0 hours 12 minutes 8 seconds  Total Procedure Duration: 0 hours 16 minutes 2 seconds  Findings:      Localized mild inflammation characterized by erythema was found in the       terminal ileum. Biopsies were taken with a cold forceps for histology.      Patchy mild inflammation characterized by erosions and erythema was       found in the transverse colon. Biopsies were taken with a cold forceps       for histology.      Patchy mild inflammation characterized by erosions, erythema and shallow       ulcerations was found in the sigmoid colon. Biopsies were taken with a       cold forceps for histology.      A large amount of semi-solid stool was found in the sigmoid colon, in       the descending colon and  in the transverse colon, precluding       visualization. Lavage of the area was performed using copious amounts of       sterile water, resulting in incomplete clearance with continued poor       visualization. Impression:               - Preparation of the colon was inadequate.                           - Mild inflammation was found in the ileum                            secondary to ileitis. Biopsied.                           - Patchy mild inflammation was found in the                            transverse colon secondary to  colitis. Biopsied.                           - Patchy mild inflammation was found in the sigmoid                            colon secondary to colitis. Biopsied.                           - Stool in the sigmoid colon, in the descending                            colon and in the transverse colon. Moderate Sedation:      Per Anesthesia Care Recommendation:           - Patient has a contact number available for                            emergencies. The signs and symptoms of potential                            delayed complications were discussed with the                            patient. Return to normal activities tomorrow.                            Written discharge instructions were provided to the                            patient.                           - Resume previous diet.                           - Continue present medications.                           -  Await pathology results.                           - Repeat colonoscopy in 3-6 months because the                            bowel preparation was poor especially on L side.                            Patient reports accidentally eating yesterday,                           - Return to GI clinic in 6 weeks. Procedure Code(s):        --- Professional ---                           [phone], Colonoscopy, flexible; with biopsy, single                            or multiple Diagnosis Code(s):        --- Professional ---                           K52.9, Noninfective gastroenteritis and colitis,                            unspecified                           Z12.11, Encounter for screening for malignant                            neoplasm of colon CPT copyright 2022 American Medical Association. All rights reserved. The codes documented in this report are preliminary and upon coder review may  be revised to meet current compliance requirements. Hennie Duos. Marletta Lor, DO Hennie Duos. Marletta Lor, DO 04/21/2023 1:11:15 PM This report  has been signed electronically. Number of Addenda: 0

## 2023-04-22 LAB — SURGICAL PATHOLOGY

## 2023-04-23 ENCOUNTER — Encounter: Payer: Self-pay | Admitting: Internal Medicine

## 2023-04-28 ENCOUNTER — Encounter (HOSPITAL_COMMUNITY): Payer: Self-pay | Admitting: Internal Medicine

## 2023-04-30 ENCOUNTER — Encounter: Payer: Self-pay | Admitting: *Deleted

## 2023-06-04 NOTE — Progress Notes (Unsigned)
Referring Provider: Rebekah Chesterfield, NP Primary Care Physician:  Rebekah Chesterfield, NP Primary GI Physician: Dr. Marletta Lor  No chief complaint on file.   HPI:   Marie Diaz is a 57 y.o. female with history of chronic abdominal pain, weight loss with multiple prior ED presentations for abdominal pain with largely negative evaluations. EGD August 2022 with gastritis and pathology with reactive gastropathy, negative for H. pylori. CTA abdomen/pelvis with patent mesenteric vasculature in September 2022. 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 Previously evaluated by psychology during hospital admission and felt that she likely had psychosomatic component to her abdominal pain symptoms and started on low-dose dicyclomine and amitriptyline. Later reported Bentyl not helpful. Stopped amitriptyline on her own, unclear if this medication had any effect on her symptoms.   She did have a CT in March 2024 while in the ER that showed subtle stranding adjacent to the ascending colon, possibly related to infectious/inflammatory colitis.  She was prescribed a 10-day course of Cipro and Flagyl though she did not have any typical of symptoms of colitis.  Last seen in the office 04/16/2023.  She continued with constant epigastric burning.  Denied worsening with meals, nausea, vomiting.  Also daily heartburn.  Weight stable.  Had not started omeprazole as prescribed at prior visit.  Denied NSAIDs.  Recommended omeprazole 40 mg twice daily, Carafate 4 times daily, EGD, screening colonoscopy, and surveillance MRI.  EGD 04/21/2023: Regular Z-line, esophageal biopsies taken, gastritis biopsied, normal examined duodenum.  Pathology with focal mild chronic inactive gastritis, no H. pylori.  Esophageal biopsies benign.  Recommended PPI BID and NSAID avoidance.   Colonoscopy 04/21/2023: Inadequate prep, mild inflammation in the ileum secondary to ileitis biopsied,  patchy mild inflammation in the transverse colon, sigmoid colon secondary to colitis biopsied.  Suspected poor prep may be secondary to patient eating the day prior and recommended repeat colonoscopy in 3-6 months.   Pathology with active ileitis and focal mild active colitis in the transverse colon with no chronicity, granuloma, dysplasia, or malignancy.  Stated this was nonspecific and could be seen in infection, medication effect, self-limiting colitis.  Early IBD not entirely excluded.  Recommended avoiding NSAIDs, if diarrhea, check GI path panel.  Unfortunately, we were unable to connect with patient.    MRI/MRCP 04/20/2023 with stable pancreatic cyst dating back to 2016.  Appeared to communicate with the main pancreatic duct.  MRI also showed moderate amount of stool in her colon recommended starting MiraLAX daily.***  Today:    Past Medical History:  Diagnosis Date   Anxiety    Depression    Gastric ulcer    Gastritis    Hypertension     Past Surgical History:  Procedure Laterality Date   ABDOMINAL HYSTERECTOMY     BIOPSY  08/29/2019   Procedure: BIOPSY;  Surgeon: West Bali, MD;  [facility];  Service: Endoscopy;;   BIOPSY  02/26/2021   Procedure: BIOPSY;  Surgeon: Lanelle Bal, DO;  [facility];  Service: Endoscopy;;   BIOPSY  04/21/2023   Procedure: BIOPSY;  Surgeon: Lanelle Bal, DO;  [facility];  Service: Endoscopy;;   CHOLECYSTECTOMY     COLONOSCOPY WITH PROPOFOL N/A 04/21/2023   Procedure: COLONOSCOPY WITH PROPOFOL;  Surgeon: Lanelle Bal, DO;  [facility];  Service: Endoscopy;  Laterality: N/A;  12:45 am, asa 2   ESOPHAGOGASTRODUODENOSCOPY N/A 08/29/2019  normal esophagus, localized moderate inflammation with adherent blood, congestion, erosions, and friability on greater curvature of stomach. Biopsy with chronic gastritis, negative H.pylori.    ESOPHAGOGASTRODUODENOSCOPY (EGD) WITH PROPOFOL N/A  02/26/2021   gastritis and pathology with reactive gastropathy and negative H.pylori   ESOPHAGOGASTRODUODENOSCOPY (EGD) WITH PROPOFOL N/A 04/21/2023   Procedure: ESOPHAGOGASTRODUODENOSCOPY (EGD) WITH PROPOFOL;  Surgeon: Lanelle Bal, DO;  [facility];  Service: Endoscopy;  Laterality: N/A;   HEMORRHOID SURGERY      Current Outpatient Medications  Medication Sig Dispense Refill   cloNIDine (CATAPRES) 0.2 MG tablet Take 0.2 mg by mouth 2 (two) times daily.     losartan (COZAAR) 50 MG tablet Take 50 mg by mouth daily.     omeprazole (PRILOSEC) 40 MG capsule Take 1 capsule (40 mg total) by mouth 2 (two) times daily before a meal. 60 capsule 3   rosuvastatin (CRESTOR) 10 MG tablet Take 10 mg by mouth at bedtime.     sucralfate (CARAFATE) 1 g tablet Take 1 tablet (1 g total) by mouth 4 (four) times daily -  with meals and at bedtime. 120 tablet 3   No current facility-administered medications for this visit.    Allergies as of 06/05/2023 - Review Complete 04/21/2023  Allergen Reaction Noted   Tetracyclines & related Other (See Comments) 11/07/2015   Ultram [tramadol] Other (See Comments) 08/01/2012    Family History  Problem Relation Age of Onset   Colon cancer Neg Hx    Colon polyps Neg Hx     Social History   Socioeconomic History   Marital status: Married    Spouse name: Not on file   Number of children: Not on file   Years of education: Not on file   Highest education level: Not on file  Occupational History   Not on file  Tobacco Use   Smoking status: Former    Types: Cigarettes   Smokeless tobacco: Never  Vaping Use   Vaping status: Former  Substance and Sexual Activity   Alcohol use: No   Drug use: No   Sexual activity: Yes  Other Topics Concern   Not on file  Social History Narrative   [phone]), kids(2). 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 SHE'S AN ONLY CHILD.   Social Determinants of  Health   Financial Resource Strain: Not on file  Food Insecurity: Low Risk  (12/14/2022)   Received from [facility]   Food Insecurity    Within the past 12 months, did the food you bought just not last and you didn't have money to get more?: No    Within the past 12 months, did you worry that your food would run out before you got money to buy more?: No  Transportation Needs: Low Risk  (12/14/2022)   Received from [facility]   Transportation Needs    Within the past 12 months, has a lack of transportation kept you from medical appointments or from doing things needed for daily living?: No  Physical Activity: Not on file  Stress: Not on file  Social Connections: Not on file    Review of Systems: Gen: Denies fever, chills, anorexia. Denies fatigue, weakness, weight loss.  CV: Denies chest pain, palpitations, syncope, peripheral edema, and claudication. Resp: Denies dyspnea at rest, cough, wheezing, coughing up blood, and pleurisy. GI: Denies vomiting blood, jaundice, and fecal incontinence.   Denies dysphagia or odynophagia.  Derm: Denies rash, itching, dry skin Psych: Denies depression, anxiety, memory loss, confusion. No homicidal or suicidal ideation.  Heme: Denies bruising, bleeding, and enlarged lymph nodes.  Physical Exam: There were no vitals taken for this visit. General:   Alert and oriented. No distress noted. Pleasant and cooperative.  Head:  Normocephalic and atraumatic. Eyes:  Conjuctiva clear without scleral icterus. Heart:  S1, S2 present without murmurs appreciated. Lungs:  Clear to auscultation bilaterally. No wheezes, rales, or rhonchi. No distress.  Abdomen:  +BS, soft, non-tender and non-distended. No rebound or guarding. No HSM or masses noted. Msk:  Symmetrical without gross deformities. Normal posture. Extremities:  Without edema. Neurologic:  Alert and  oriented x4 Psych:  Normal mood and affect.    Assessment:     Plan:   ***   Ermalinda Memos, PA-C [facility] 06/05/2023

## 2023-06-05 ENCOUNTER — Ambulatory Visit: Payer: MEDICAID | Admitting: Gastroenterology

## 2023-06-05 ENCOUNTER — Encounter: Payer: Self-pay | Admitting: Gastroenterology

## 2023-09-14 ENCOUNTER — Encounter: Payer: MEDICAID | Admitting: Gastroenterology

## 2023-09-15 NOTE — Progress Notes (Unsigned)
 Referring Provider: Rebekah Chesterfield, NP Primary Care Physician:  Rebekah Chesterfield, NP Primary GI Physician: Dr. Marletta Lor  No chief complaint on file.   HPI:   Marie Diaz is a 58 y.o. female with history of chronic abdominal pain, weight loss with multiple prior ED presentations for abdominal pain with largely negative evaluations. EGD August 2022 with gastritis and pathology with reactive gastropathy, negative for H. pylori. CTA abdomen/pelvis with patent mesenteric vasculature in September 2022. 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 Previously evaluated by psychology during hospital admission and felt that she likely had psychosomatic component to her abdominal pain symptoms and started on low-dose dicyclomine and amitriptyline. Later reported Bentyl not helpful. Stopped amitriptyline on her own, unclear if this medication had any effect on her symptoms.   She did have a CT in March 2024 while in the ER that showed subtle stranding adjacent to the ascending colon, possibly related to infectious/inflammatory colitis.  She was prescribed a 10-day course of Cipro and Flagyl though she did not have any typical of symptoms of colitis.   Last seen in the office 04/16/2023.  She continued with constant epigastric burning.  Denied worsening with meals, nausea, vomiting.  Also daily heartburn.  Weight stable.  Had not started omeprazole as prescribed at prior visit.  Denied NSAIDs.  Recommended omeprazole 40 mg twice daily, Carafate 4 times daily, EGD, screening colonoscopy, and surveillance MRI.   EGD 04/21/2023: Regular Z-line, esophageal biopsies taken, gastritis biopsied, normal examined duodenum.  Pathology with focal mild chronic inactive gastritis, no H. pylori.  Esophageal biopsies benign.  Recommended PPI BID and NSAID avoidance.    Colonoscopy 04/21/2023: Inadequate prep, mild inflammation in the ileum secondary to ileitis biopsied,  patchy mild inflammation in the transverse colon, sigmoid colon secondary to colitis biopsied.  Suspected poor prep may be secondary to patient eating the day prior and recommended repeat colonoscopy in 3-6 months.   Pathology with active ileitis and focal mild active colitis in the transverse colon with no chronicity, granuloma, dysplasia, or malignancy.  Stated this was nonspecific and could be seen in infection, medication effect, self-limiting colitis.  Early IBD not entirely excluded.  Recommended avoiding NSAIDs, if diarrhea, check GI path panel.  Unfortunately, we were unable to connect with patient.     MRI/MRCP 04/20/2023 with stable pancreatic cyst dating back to 2016.  Appeared to communicate with the main pancreatic duct.  MRI also showed moderate amount of stool in her colon recommended starting MiraLAX daily.***    Seen at [facility] ER 09/10/2023 for epigastric pain radiating to her chest.  She was found to have mildly elevated alkaline phosphatase of 137, otherwise no significant laboratory abnormalities.  CT A/P without contrast with no acute abnormalities.  She was advised to double Prilosec dose for 1 week, add Pepcid, Tylenol, avoid NSAIDs, increase hydration, and bland/brat diet, follow-up with GI and PCP.   Today:         Past Medical History:  Diagnosis Date   Anxiety    Depression    Gastric ulcer    Gastritis    Hypertension     Past Surgical History:  Procedure Laterality Date   ABDOMINAL HYSTERECTOMY     BIOPSY  08/29/2019   Procedure: BIOPSY;  Surgeon: West Bali, MD;  [facility];  Service: Endoscopy;;   BIOPSY  02/26/2021   Procedure: BIOPSY;  Surgeon: Lanelle Bal,  DO;  [facility];  Service: Endoscopy;;   BIOPSY  04/21/2023   Procedure: BIOPSY;  Surgeon: Lanelle Bal, DO;  [facility];  Service: Endoscopy;;   CHOLECYSTECTOMY     COLONOSCOPY WITH PROPOFOL N/A 04/21/2023   Procedure: COLONOSCOPY WITH  PROPOFOL;  Surgeon: Lanelle Bal, DO;  [facility];  Service: Endoscopy;  Laterality: N/A;  12:45 am, asa 2   ESOPHAGOGASTRODUODENOSCOPY N/A 08/29/2019   normal esophagus, localized moderate inflammation with adherent blood, congestion, erosions, and friability on greater curvature of stomach. Biopsy with chronic gastritis, negative H.pylori.    ESOPHAGOGASTRODUODENOSCOPY (EGD) WITH PROPOFOL N/A 02/26/2021   gastritis and pathology with reactive gastropathy and negative H.pylori   ESOPHAGOGASTRODUODENOSCOPY (EGD) WITH PROPOFOL N/A 04/21/2023   Procedure: ESOPHAGOGASTRODUODENOSCOPY (EGD) WITH PROPOFOL;  Surgeon: Lanelle Bal, DO;  [facility];  Service: Endoscopy;  Laterality: N/A;   HEMORRHOID SURGERY      Current Outpatient Medications  Medication Sig Dispense Refill   cloNIDine (CATAPRES) 0.2 MG tablet Take 0.2 mg by mouth 2 (two) times daily.     losartan (COZAAR) 50 MG tablet Take 50 mg by mouth daily.     omeprazole (PRILOSEC) 40 MG capsule Take 1 capsule (40 mg total) by mouth 2 (two) times daily before a meal. 60 capsule 3   rosuvastatin (CRESTOR) 10 MG tablet Take 10 mg by mouth at bedtime.     sucralfate (CARAFATE) 1 g tablet Take 1 tablet (1 g total) by mouth 4 (four) times daily -  with meals and at bedtime. 120 tablet 3   No current facility-administered medications for this visit.    Allergies as of 09/16/2023 - Review Complete 04/21/2023  Allergen Reaction Noted   Tetracyclines & related Other (See Comments) 11/07/2015   Ultram [tramadol] Other (See Comments) 08/01/2012    Family History  Problem Relation Age of Onset   Colon cancer Neg Hx    Colon polyps Neg Hx     Social History   Socioeconomic History   Marital status: Married    Spouse name: Not on file   Number of children: Not on file   Years of education: Not on file   Highest education level: Not on file  Occupational History   Not on file  Tobacco Use   Smoking  status: Former    Types: Cigarettes   Smokeless tobacco: Never  Vaping Use   Vaping status: Former  Substance and Sexual Activity   Alcohol use: No   Drug use: No   Sexual activity: Yes  Other Topics Concern   Not on file  Social History Narrative   [phone]), kids(2). 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 SHE'S AN ONLY CHILD.   Social Drivers of Corporate investment banker Strain: Low Risk  (06/08/2023)   Received from [facility]   Overall Financial Resource Strain (CARDIA)    Difficulty of Paying Living Expenses: Not very hard  Food Insecurity: No Food Insecurity (06/08/2023)   Received [facility]   Hunger Vital Sign    Worried About Running Out of Food in the Last Year: Never true    Ran Out of Food in the Last Year: Never true  Transportation Needs: No Transportation Needs (06/08/2023)   Received from [facility] - Transportation    Lack of Transportation (Medical): No    Lack of Transportation (Non-Medical): No  Physical Activity: Not on file  Stress: Not on file  Social Connections: Not on file    Review of Systems: Gen: Denies fever, chills, anorexia. Denies fatigue, weakness, weight loss.  CV: Denies chest pain, palpitations, syncope, peripheral edema, and claudication. Resp: Denies dyspnea at rest, cough, wheezing, coughing up blood, and pleurisy. GI: Denies vomiting blood, jaundice, and fecal incontinence.   Denies dysphagia or odynophagia. Derm: Denies rash, itching, dry skin Psych: Denies depression, anxiety, memory loss, confusion. No homicidal or suicidal ideation.  Heme: Denies bruising, bleeding, and enlarged lymph nodes.  Physical Exam: There were no vitals taken for this visit. General:   Alert and oriented. No distress noted. Pleasant and cooperative.  Head:  Normocephalic and atraumatic. Eyes:  Conjuctiva clear without scleral icterus. Heart:  S1, S2 present without murmurs  appreciated. Lungs:  Clear to auscultation bilaterally. No wheezes, rales, or rhonchi. No distress.  Abdomen:  +BS, soft, non-tender and non-distended. No rebound or guarding. No HSM or masses noted. Msk:  Symmetrical without gross deformities. Normal posture. Extremities:  Without edema. Neurologic:  Alert and  oriented x4 Psych:  Normal mood and affect.    Assessment:     Plan:  ***   Ermalinda Memos, PA-C [facility] 09/16/2023

## 2023-09-15 NOTE — H&P (View-Only) (Signed)
 Referring Provider: Rebekah Chesterfield, NP Primary Care Physician:  Rebekah Chesterfield, NP Primary GI Physician: Dr. Marletta Lor  No chief complaint on file.   HPI:   Marie Diaz is a 58 y.o. female with history of chronic abdominal pain, weight loss with multiple prior ED presentations for abdominal pain with largely negative evaluations. EGD August 2022 with gastritis and pathology with reactive gastropathy, negative for H. pylori. CTA abdomen/pelvis with patent mesenteric vasculature in September 2022. 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 Previously evaluated by psychology during hospital admission and felt that she likely had psychosomatic component to her abdominal pain symptoms and started on low-dose dicyclomine and amitriptyline. Later reported Bentyl not helpful. Stopped amitriptyline on her own, unclear if this medication had any effect on her symptoms.   She did have a CT in March 2024 while in the ER that showed subtle stranding adjacent to the ascending colon, possibly related to infectious/inflammatory colitis.  She was prescribed a 10-day course of Cipro and Flagyl though she did not have any typical of symptoms of colitis.   Last seen in the office 04/16/2023.  She continued with constant epigastric burning.  Denied worsening with meals, nausea, vomiting.  Also daily heartburn.  Weight stable.  Had not started omeprazole as prescribed at prior visit.  Denied NSAIDs.  Recommended omeprazole 40 mg twice daily, Carafate 4 times daily, EGD, screening colonoscopy, and surveillance MRI.   EGD 04/21/2023: Regular Z-line, esophageal biopsies taken, gastritis biopsied, normal examined duodenum.  Pathology with focal mild chronic inactive gastritis, no H. pylori.  Esophageal biopsies benign.  Recommended PPI BID and NSAID avoidance.    Colonoscopy 04/21/2023: Inadequate prep, mild inflammation in the ileum secondary to ileitis biopsied,  patchy mild inflammation in the transverse colon, sigmoid colon secondary to colitis biopsied.  Suspected poor prep may be secondary to patient eating the day prior and recommended repeat colonoscopy in 3-6 months.   Pathology with active ileitis and focal mild active colitis in the transverse colon with no chronicity, granuloma, dysplasia, or malignancy.  Stated this was nonspecific and could be seen in infection, medication effect, self-limiting colitis.  Early IBD not entirely excluded.  Recommended avoiding NSAIDs, if diarrhea, check GI path panel.  Unfortunately, we were unable to connect with patient.     MRI/MRCP 04/20/2023 with stable pancreatic cyst dating back to 2016.  Appeared to communicate with the main pancreatic duct.  MRI also showed moderate amount of stool in her colon recommended starting MiraLAX daily.***    Seen at [facility] ER 09/10/2023 for epigastric pain radiating to her chest.  She was found to have mildly elevated alkaline phosphatase of 137, otherwise no significant laboratory abnormalities.  CT A/P without contrast with no acute abnormalities.  She was advised to double Prilosec dose for 1 week, add Pepcid, Tylenol, avoid NSAIDs, increase hydration, and bland/brat diet, follow-up with GI and PCP.   Today:         Past Medical History:  Diagnosis Date   Anxiety    Depression    Gastric ulcer    Gastritis    Hypertension     Past Surgical History:  Procedure Laterality Date   ABDOMINAL HYSTERECTOMY     BIOPSY  08/29/2019   Procedure: BIOPSY;  Surgeon: West Bali, MD;  [facility]: [facility];  Service: Endoscopy;;   BIOPSY  02/26/2021   Procedure: BIOPSY;  Surgeon: Lanelle Bal,  DO;  [facility]: [facility];  Service: Endoscopy;;   BIOPSY  04/21/2023   Procedure: BIOPSY;  Surgeon: Lanelle Bal, DO;  [facility]: [facility];  Service: Endoscopy;;   CHOLECYSTECTOMY     COLONOSCOPY WITH PROPOFOL N/A 04/21/2023   Procedure: COLONOSCOPY WITH  PROPOFOL;  Surgeon: Lanelle Bal, DO;  [facility]: [facility];  Service: Endoscopy;  Laterality: N/A;  12:45 am, asa 2   ESOPHAGOGASTRODUODENOSCOPY N/A 08/29/2019   normal esophagus, localized moderate inflammation with adherent blood, congestion, erosions, and friability on greater curvature of stomach. Biopsy with chronic gastritis, negative H.pylori.    ESOPHAGOGASTRODUODENOSCOPY (EGD) WITH PROPOFOL N/A 02/26/2021   gastritis and pathology with reactive gastropathy and negative H.pylori   ESOPHAGOGASTRODUODENOSCOPY (EGD) WITH PROPOFOL N/A 04/21/2023   Procedure: ESOPHAGOGASTRODUODENOSCOPY (EGD) WITH PROPOFOL;  Surgeon: Lanelle Bal, DO;  [facility]: [facility];  Service: Endoscopy;  Laterality: N/A;   HEMORRHOID SURGERY      Current Outpatient Medications  Medication Sig Dispense Refill   cloNIDine (CATAPRES) 0.2 MG tablet Take 0.2 mg by mouth 2 (two) times daily.     losartan (COZAAR) 50 MG tablet Take 50 mg by mouth daily.     omeprazole (PRILOSEC) 40 MG capsule Take 1 capsule (40 mg total) by mouth 2 (two) times daily before a meal. 60 capsule 3   rosuvastatin (CRESTOR) 10 MG tablet Take 10 mg by mouth at bedtime.     sucralfate (CARAFATE) 1 g tablet Take 1 tablet (1 g total) by mouth 4 (four) times daily -  with meals and at bedtime. 120 tablet 3   No current facility-administered medications for this visit.    Allergies as of 09/16/2023 - Review Complete 04/21/2023  Allergen Reaction Noted   Tetracyclines & related Other (See Comments) 11/07/2015   Ultram [tramadol] Other (See Comments) 08/01/2012    Family History  Problem Relation Age of Onset   Colon cancer Neg Hx    Colon polyps Neg Hx     Social History   Socioeconomic History   Marital status: Married    Spouse name: Not on file   Number of children: Not on file   Years of education: Not on file   Highest education level: Not on file  Occupational History   Not on file  Tobacco Use   Smoking  status: Former    Types: Cigarettes   Smokeless tobacco: Never  Vaping Use   Vaping status: Former  Substance and Sexual Activity   Alcohol use: No   Drug use: No   Sexual activity: Yes  Other Topics Concern   Not on file  Social History Narrative   [phone]), kids(2). 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 SHE'S AN ONLY CHILD.   Social Drivers of Corporate investment banker Strain: Low Risk  (06/08/2023)   Received from [facility]   Overall Financial Resource Strain (CARDIA)    Difficulty of Paying Living Expenses: Not very hard  Food Insecurity: No Food Insecurity (06/08/2023)   Received [facility]   Hunger Vital Sign    Worried About Running Out of Food in the Last Year: Never true    Ran Out of Food in the Last Year: Never true  Transportation Needs: No Transportation Needs (06/08/2023)   Received from [facility] - Transportation    Lack of Transportation (Medical): No    Lack of Transportation (Non-Medical): No  Physical Activity: Not on file  Stress: Not on file  Social Connections: Not on file    Review of Systems: Gen: Denies fever, chills, anorexia. Denies fatigue, weakness, weight loss.  CV: Denies chest pain, palpitations, syncope, peripheral edema, and claudication. Resp: Denies dyspnea at rest, cough, wheezing, coughing up blood, and pleurisy. GI: Denies vomiting blood, jaundice, and fecal incontinence.   Denies dysphagia or odynophagia. Derm: Denies rash, itching, dry skin Psych: Denies depression, anxiety, memory loss, confusion. No homicidal or suicidal ideation.  Heme: Denies bruising, bleeding, and enlarged lymph nodes.  Physical Exam: There were no vitals taken for this visit. General:   Alert and oriented. No distress noted. Pleasant and cooperative.  Head:  Normocephalic and atraumatic. Eyes:  Conjuctiva clear without scleral icterus. Heart:  S1, S2 present without murmurs  appreciated. Lungs:  Clear to auscultation bilaterally. No wheezes, rales, or rhonchi. No distress.  Abdomen:  +BS, soft, non-tender and non-distended. No rebound or guarding. No HSM or masses noted. Msk:  Symmetrical without gross deformities. Normal posture. Extremities:  Without edema. Neurologic:  Alert and  oriented x4 Psych:  Normal mood and affect.    Assessment:     Plan:  ***   Ermalinda Memos, PA-C [facility] 09/16/2023

## 2023-09-16 ENCOUNTER — Other Ambulatory Visit: Payer: Self-pay | Admitting: *Deleted

## 2023-09-16 ENCOUNTER — Ambulatory Visit: Payer: MEDICAID | Admitting: Gastroenterology

## 2023-09-16 ENCOUNTER — Encounter: Payer: Self-pay | Admitting: Gastroenterology

## 2023-09-16 ENCOUNTER — Ambulatory Visit (INDEPENDENT_AMBULATORY_CARE_PROVIDER_SITE_OTHER): Payer: MEDICAID | Admitting: Gastroenterology

## 2023-09-16 ENCOUNTER — Encounter: Payer: Self-pay | Admitting: *Deleted

## 2023-09-16 VITALS — BP 138/88 | HR 81 | Temp 97.6°F | Ht 63.0 in | Wt 115.6 lb

## 2023-09-16 DIAGNOSIS — Z8719 Personal history of other diseases of the digestive system: Secondary | ICD-10-CM | POA: Diagnosis not present

## 2023-09-16 DIAGNOSIS — R1312 Dysphagia, oropharyngeal phase: Secondary | ICD-10-CM | POA: Diagnosis not present

## 2023-09-16 DIAGNOSIS — R1013 Epigastric pain: Secondary | ICD-10-CM | POA: Diagnosis not present

## 2023-09-16 DIAGNOSIS — K219 Gastro-esophageal reflux disease without esophagitis: Secondary | ICD-10-CM

## 2023-09-16 DIAGNOSIS — K5900 Constipation, unspecified: Secondary | ICD-10-CM

## 2023-09-16 MED ORDER — CLENPIQ 10-3.5-12 MG-GM -GM/175ML PO SOLN: 1.0000 | ORAL | 0 refills | Status: DC

## 2023-09-16 MED ORDER — LINACLOTIDE 72 MCG PO CAPS: 72.0000 ug | ORAL_CAPSULE | Freq: Every day | ORAL | 3 refills | Status: AC

## 2023-09-16 MED ORDER — ESOMEPRAZOLE MAGNESIUM 40 MG PO CPDR: 40.0000 mg | DELAYED_RELEASE_CAPSULE | Freq: Two times a day (BID) | ORAL | 3 refills | Status: DC

## 2023-09-16 NOTE — Patient Instructions (Addendum)
 We will get you scheduled for a colonoscopy with Dr. Marletta Lor.   I am also placing a referral for a swallow study to further evaluate your swallowing problems.  Stop omeprazole  Start esomeprazole (Nexium) 40 mg twice daily 30 minutes before breakfast and dinner.  Stop Carafate  If no change in your upper abdominal burning after 2 weeks of Nexium, please let me know, and I will plan to start you on amitriptyline.  I would also like for you to start Linzess 72 mcg daily to help with mild constipation. I have sent a prescription to your pharmacy.  We will get you back in the office in about 6 weeks to see Dr. Marletta Lor.  Ermalinda Memos, PA-C [facility]

## 2023-10-02 ENCOUNTER — Other Ambulatory Visit: Payer: Self-pay | Admitting: Gastroenterology

## 2023-10-02 ENCOUNTER — Ambulatory Visit (HOSPITAL_COMMUNITY): Payer: MEDICAID | Attending: Speech Pathology | Admitting: Speech Pathology

## 2023-10-02 ENCOUNTER — Other Ambulatory Visit (HOSPITAL_COMMUNITY): Payer: Self-pay | Admitting: Internal Medicine

## 2023-10-02 DIAGNOSIS — R1312 Dysphagia, oropharyngeal phase: Secondary | ICD-10-CM

## 2023-10-06 ENCOUNTER — Encounter (HOSPITAL_COMMUNITY)
Admission: RE | Disposition: A | Discharge status: Home / Self Care | Payer: Self-pay | Source: Home / Self Care | Attending: Internal Medicine

## 2023-10-06 ENCOUNTER — Ambulatory Visit (HOSPITAL_COMMUNITY)
Admission: RE | Admit: 2023-10-06 | Discharge: 2023-10-06 | Disposition: A | Discharge status: Home / Self Care | Payer: MEDICAID | Attending: Internal Medicine | Admitting: Internal Medicine

## 2023-10-06 ENCOUNTER — Ambulatory Visit (HOSPITAL_COMMUNITY): Payer: MEDICAID | Admitting: Anesthesiology

## 2023-10-06 ENCOUNTER — Other Ambulatory Visit: Payer: Self-pay

## 2023-10-06 ENCOUNTER — Encounter (HOSPITAL_COMMUNITY): Payer: Self-pay | Admitting: Internal Medicine

## 2023-10-06 DIAGNOSIS — I10 Essential (primary) hypertension: Secondary | ICD-10-CM | POA: Diagnosis not present

## 2023-10-06 DIAGNOSIS — K529 Noninfective gastroenteritis and colitis, unspecified: Secondary | ICD-10-CM

## 2023-10-06 DIAGNOSIS — K6389 Other specified diseases of intestine: Secondary | ICD-10-CM | POA: Diagnosis not present

## 2023-10-06 DIAGNOSIS — K5730 Diverticulosis of large intestine without perforation or abscess without bleeding: Secondary | ICD-10-CM | POA: Diagnosis not present

## 2023-10-06 DIAGNOSIS — R1033 Periumbilical pain: Secondary | ICD-10-CM | POA: Diagnosis present

## 2023-10-06 DIAGNOSIS — Z87891 Personal history of nicotine dependence: Secondary | ICD-10-CM | POA: Diagnosis not present

## 2023-10-06 DIAGNOSIS — K648 Other hemorrhoids: Secondary | ICD-10-CM | POA: Diagnosis not present

## 2023-10-06 DIAGNOSIS — K633 Ulcer of intestine: Secondary | ICD-10-CM | POA: Diagnosis not present

## 2023-10-06 HISTORY — PX: COLONOSCOPY: SHX5424

## 2023-10-06 SURGERY — COLONOSCOPY
Anesthesia: General

## 2023-10-06 MED ORDER — LACTATED RINGERS IV SOLN: INTRAVENOUS | Status: DC | PRN

## 2023-10-06 MED ORDER — LIDOCAINE HCL (CARDIAC) PF 100 MG/5ML IV SOSY
PREFILLED_SYRINGE | INTRAVENOUS | Status: DC | PRN
  Administered 2023-10-06: 60 mg via INTRATRACHEAL

## 2023-10-06 MED ORDER — PROPOFOL 500 MG/50ML IV EMUL
INTRAVENOUS | Status: DC | PRN
  Administered 2023-10-06: 150 ug/kg/min via INTRAVENOUS

## 2023-10-06 MED ORDER — LACTATED RINGERS IV SOLN: INTRAVENOUS | Status: DC

## 2023-10-06 NOTE — Op Note (Signed)
 [facility] Patient Name: Marie Diaz Procedure Date: 10/06/2023 11:30 AM MRN: [id number] Date of Birth: Nov 25, 1965 Attending MD: Hennie Duos. Marletta Lor , [place], [id number] CSN: [id number] Age: 58 Admit Type: Outpatient Procedure:                Colonoscopy Indications:              Periumbilical abdominal pain, Follow-up of colitis Providers:                Hennie Duos. Marletta Lor, DO, Crystal Page, [employer].                            Jessee Avers, Technician Referring MD:              Medicines:                See the Anesthesia note for documentation of the                            administered medications Complications:            No immediate complications. Estimated Blood Loss:     Estimated blood loss was minimal. Procedure:                Pre-Anesthesia Assessment:                           - The anesthesia plan was to use monitored                            anesthesia care (MAC).                           After obtaining informed consent, the colonoscope                            was passed under direct vision. Throughout the                            procedure, the patient's blood pressure, pulse, and                            oxygen saturations were monitored continuously. The                            [id number]-HQ190L ([id number]) scope was introduced through                            the anus and advanced to the the terminal ileum,                            with identification of the appendiceal orifice and                            IC valve. The colonoscopy was performed without                            difficulty. The  patient tolerated the procedure                            well. The quality of the bowel preparation was                            evaluated using the BBPS [facility] Bowel Preparation                            Scale) with scores of: Right Colon = 2 (minor                            amount of residual staining, small fragments of                             stool and/or opaque liquid, but mucosa seen well),                            Transverse Colon = 2 (minor amount of residual                            staining, small fragments of stool and/or opaque                            liquid, but mucosa seen well) and Left Colon = 2                            (minor amount of residual staining, small fragments                            of stool and/or opaque liquid, but mucosa seen                            well). The total BBPS score equals 6. The quality                            of the bowel preparation was good. Scope In: 11:54:22 AM Scope Out: 12:18:13 PM Scope Withdrawal Time: 0 hours 19 minutes 37 seconds  Total Procedure Duration: 0 hours 23 minutes 51 seconds  Findings:      Non-bleeding internal hemorrhoids were found.      Multiple small-mouthed diverticula were found in the sigmoid colon.      Localized moderate inflammation characterized by erosions, erythema and       shallow ulcerations was found in the terminal ileum. Biopsies were taken       with a cold forceps for histology.      Segmental biopsies were taken with a cold forceps in the entire colon       for histology. Impression:               - Non-bleeding internal hemorrhoids.                           - Diverticulosis in the sigmoid colon.                           -  Moderate inflammation was found in the ileum                            secondary to ileitis. Biopsied.                           - Biopsies were taken with a cold forceps for                            histology in the entire colon. Moderate Sedation:      Per Anesthesia Care Recommendation:           - Patient has a contact number available for                            emergencies. The signs and symptoms of potential                            delayed complications were discussed with the                            patient. Return to normal activities tomorrow.                            Written  discharge instructions were provided to the                            patient.                           - Resume previous diet.                           - Continue present medications.                           - Await pathology results.                           - Repeat colonoscopy for surveillance based on                            pathology results.                           - Consider course of budesonide.                           - Return to GI clinic in 6 weeks. Procedure Code(s):        --- Professional ---                           [phone], Colonoscopy, flexible; with biopsy, single                            or multiple Diagnosis Code(s):        --- Professional ---  K64.8, Other hemorrhoids                           K52.9, Noninfective gastroenteritis and colitis,                            unspecified                           R10.33, Periumbilical pain                           K57.30, Diverticulosis of large intestine without                            perforation or abscess without bleeding CPT copyright 2022 American Medical Association. All rights reserved. The codes documented in this report are preliminary and upon coder review may  be revised to meet current compliance requirements. Hennie Duos. Marletta Lor, DO Hennie Duos. Marletta Lor, DO 10/06/2023 12:22:58 PM This report has been signed electronically. Number of Addenda: 0

## 2023-10-06 NOTE — Anesthesia Preprocedure Evaluation (Signed)
 Anesthesia Evaluation  Patient identified by MRN, date of birth, ID band Patient awake    Reviewed: Allergy & Precautions, H&P , NPO status , Patient's Chart, lab work & pertinent test results, reviewed documented beta blocker date and time   Airway Mallampati: II  TM Distance: >3 FB Neck ROM: full    Dental no notable dental hx.    Pulmonary neg pulmonary ROS, Current Smoker   Pulmonary exam normal breath sounds clear to auscultation       Cardiovascular Exercise Tolerance: Good hypertension, negative cardio ROS  Rhythm:regular Rate:Normal     Neuro/Psych  PSYCHIATRIC DISORDERS Anxiety Depression    negative neurological ROS  negative psych ROS   GI/Hepatic negative GI ROS, Neg liver ROS, PUD,,,  Endo/Other  negative endocrine ROS    Renal/GU negative Renal ROS  negative genitourinary   Musculoskeletal   Abdominal   Peds  Hematology negative hematology ROS (+)   Anesthesia Other Findings   Reproductive/Obstetrics negative OB ROS                             Anesthesia Physical Anesthesia Plan  ASA: 2  Anesthesia Plan: General   Post-op Pain Management:    Induction:   PONV Risk Score and Plan: Propofol infusion  Airway Management Planned:   Additional Equipment:   Intra-op Plan:   Post-operative Plan:   Informed Consent: I have reviewed the patients History and Physical, chart, labs and discussed the procedure including the risks, benefits and alternatives for the proposed anesthesia with the patient or authorized representative who has indicated his/her understanding and acceptance.     Dental Advisory Given  Plan Discussed with: CRNA  Anesthesia Plan Comments:        Anesthesia Quick Evaluation

## 2023-10-06 NOTE — Interval H&P Note (Signed)
 History and Physical Interval Note:  10/06/2023 11:45 AM  Marie Diaz  has presented today for surgery, with the diagnosis of HX COLITIS.  The various methods of treatment have been discussed with the patient and family. After consideration of risks, benefits and other options for treatment, the patient has consented to  Procedure(s) with comments: COLONOSCOPY (N/A) - 11:30am, asa 2 as a surgical intervention.  The patient's history has been reviewed, patient examined, no change in status, stable for surgery.  I have reviewed the patient's chart and labs.  Questions were answered to the patient's satisfaction.     Lanelle Bal

## 2023-10-06 NOTE — Transfer of Care (Signed)
 Immediate Anesthesia Transfer of Care Note  Patient: Marie Diaz  Procedure(s) Performed: COLONOSCOPY  Patient Location: Short Stay  Anesthesia Type:General  Level of Consciousness: awake, alert , oriented, and patient cooperative  Airway & Oxygen Therapy: Patient Spontanous Breathing  Post-op Assessment: Report given to RN, Post -op Vital signs reviewed and stable, and Patient moving all extremities X 4  Post vital signs: Reviewed and stable  Last Vitals:  Vitals Value Taken Time  BP 96/53 10/06/23 1222  Temp 36.4 C 10/06/23 1222  Pulse 64 10/06/23 1222  Resp 16 10/06/23 1222  SpO2 96 % 10/06/23 1222    Last Pain:  Vitals:   10/06/23 1222  TempSrc: Oral  PainSc: 0-No pain      Patients Stated Pain Goal: 9 (10/06/23 1057)  Complications: No notable events documented.

## 2023-10-06 NOTE — Discharge Instructions (Signed)
  Colonoscopy Discharge Instructions  Read the instructions outlined below and refer to this sheet in the next few weeks. These discharge instructions provide you with general information on caring for yourself after you leave the hospital. Your doctor may also give you specific instructions. While your treatment has been planned according to the most current medical practices available, unavoidable complications occasionally occur.   ACTIVITY You may resume your regular activity, but move at a slower pace for the next 24 hours.  Take frequent rest periods for the next 24 hours.  Walking will help get rid of the air and reduce the bloated feeling in your belly (abdomen).  No driving for 24 hours (because of the medicine (anesthesia) used during the test).   Do not sign any important legal documents or operate any machinery for 24 hours (because of the anesthesia used during the test).  NUTRITION Drink plenty of fluids.  You may resume your normal diet as instructed by your doctor.  Begin with a light meal and progress to your normal diet. Heavy or fried foods are harder to digest and may make you feel sick to your stomach (nauseated).  Avoid alcoholic beverages for 24 hours or as instructed.  MEDICATIONS You may resume your normal medications unless your doctor tells you otherwise.  WHAT YOU CAN EXPECT TODAY Some feelings of bloating in the abdomen.  Passage of more gas than usual.  Spotting of blood in your stool or on the toilet paper.  IF YOU HAD POLYPS REMOVED DURING THE COLONOSCOPY: No aspirin products for 7 days or as instructed.  No alcohol for 7 days or as instructed.  Eat a soft diet for the next 24 hours.  FINDING OUT THE RESULTS OF YOUR TEST Not all test results are available during your visit. If your test results are not back during the visit, make an appointment with your caregiver to find out the results. Do not assume everything is normal if you have not heard from your  caregiver or the medical facility. It is important for you to follow up on all of your test results.  SEEK IMMEDIATE MEDICAL ATTENTION IF: You have more than a spotting of blood in your stool.  Your belly is swollen (abdominal distention).  You are nauseated or vomiting.  You have a temperature over 101.  You have abdominal pain or discomfort that is severe or gets worse throughout the day.   Your colonoscopy again showed significant inflammation in the end portion of your small bowel called the terminal ileum.  You had 1 ulcer in this region as well.  I took biopsies today.  I also took biopsies of your entire colon.  We will call with these results.  Given the chronicity of the inflammation, you may have underlying inflammatory bowel disease.  We will likely consider an extended course of steroids depending on biopsy results.  We will call with these results next week.  Follow-up with me in 6 weeks.  I hope you have a great rest of your week!  Hennie Duos. Marletta Lor, D.O. Gastroenterology and Hepatology [facility]

## 2023-10-07 ENCOUNTER — Encounter (HOSPITAL_COMMUNITY): Payer: Self-pay | Admitting: Internal Medicine

## 2023-10-07 ENCOUNTER — Other Ambulatory Visit: Payer: Self-pay | Admitting: Internal Medicine

## 2023-10-07 LAB — SURGICAL PATHOLOGY

## 2023-10-07 MED ORDER — BUDESONIDE 3 MG PO CPEP: 9.0000 mg | ORAL_CAPSULE | Freq: Every day | ORAL | 1 refills | Status: DC

## 2023-10-09 NOTE — Anesthesia Postprocedure Evaluation (Signed)
 Anesthesia Post Note  Patient: Marie Diaz  Procedure(s) Performed: COLONOSCOPY  Patient location during evaluation: Phase II Anesthesia Type: General Level of consciousness: awake Pain management: pain level controlled Vital Signs Assessment: post-procedure vital signs reviewed and stable Respiratory status: spontaneous breathing and respiratory function stable Cardiovascular status: blood pressure returned to baseline and stable Postop Assessment: no headache and no apparent nausea or vomiting Anesthetic complications: no Comments: Late entry   No notable events documented.   Last Vitals:  Vitals:   10/06/23 1222 10/06/23 1226  BP: (!) 96/53 (!) 105/58  Pulse: 64 64  Resp: 16 16  Temp: (!) 36.4 C   SpO2: 96% 96%    Last Pain:  Vitals:   10/06/23 1222  TempSrc: Oral  PainSc: 0-No pain                 Windell Norfolk

## 2023-10-28 ENCOUNTER — Ambulatory Visit: Payer: MEDICAID | Admitting: Internal Medicine

## 2023-11-09 ENCOUNTER — Other Ambulatory Visit (HOSPITAL_COMMUNITY)
Admission: RE | Admit: 2023-11-09 | Discharge: 2023-11-09 | Disposition: A | Discharge status: Home / Self Care | Payer: MEDICAID | Source: Ambulatory Visit | Attending: Gastroenterology | Admitting: Gastroenterology

## 2023-11-09 ENCOUNTER — Ambulatory Visit (INDEPENDENT_AMBULATORY_CARE_PROVIDER_SITE_OTHER): Payer: MEDICAID | Admitting: Gastroenterology

## 2023-11-09 ENCOUNTER — Other Ambulatory Visit: Payer: Self-pay | Admitting: *Deleted

## 2023-11-09 ENCOUNTER — Encounter: Payer: Self-pay | Admitting: Gastroenterology

## 2023-11-09 VITALS — BP 152/101 | HR 88 | Temp 98.1°F | Ht 62.0 in | Wt 112.6 lb

## 2023-11-09 DIAGNOSIS — R1013 Epigastric pain: Secondary | ICD-10-CM

## 2023-11-09 DIAGNOSIS — K529 Noninfective gastroenteritis and colitis, unspecified: Secondary | ICD-10-CM

## 2023-11-09 DIAGNOSIS — K869 Disease of pancreas, unspecified: Secondary | ICD-10-CM | POA: Diagnosis not present

## 2023-11-09 DIAGNOSIS — R9431 Abnormal electrocardiogram [ECG] [EKG]: Secondary | ICD-10-CM

## 2023-11-09 LAB — COMPREHENSIVE METABOLIC PANEL WITH GFR
ALT: 14 U/L (ref 0–44)
AST: 16 U/L (ref 15–41)
Albumin: 4 g/dL (ref 3.5–5.0)
Alkaline Phosphatase: 100 U/L (ref 38–126)
Anion gap: 9 (ref 5–15)
BUN: 14 mg/dL (ref 6–20)
CO2: 24 mmol/L (ref 22–32)
Calcium: 9.1 mg/dL (ref 8.9–10.3)
Chloride: 105 mmol/L (ref 98–111)
Creatinine, Ser: 0.62 mg/dL (ref 0.44–1.00)
GFR, Estimated: >60 mL/min (ref >60)
Glucose, Bld: 116 mg/dL — ABNORMAL HIGH (ref 70–99)
Potassium: 3.5 mmol/L (ref 3.5–5.1)
Sodium: 138 mmol/L (ref 135–145)
Total Bilirubin: 0.5 mg/dL (ref 0.0–1.2)
Total Protein: 7.6 g/dL (ref 6.5–8.1)

## 2023-11-09 LAB — CBC WITH DIFFERENTIAL/PLATELET
Abs Immature Granulocytes: 0.04 10*3/uL (ref 0.00–0.07)
Basophils Absolute: 0.1 10*3/uL (ref 0.0–0.1)
Basophils Relative: 1 %
Eosinophils Absolute: 0.1 10*3/uL (ref 0.0–0.5)
Eosinophils Relative: 1 %
HCT: 47 % — ABNORMAL HIGH (ref 36.0–46.0)
Hemoglobin: 16.1 g/dL — ABNORMAL HIGH (ref 12.0–15.0)
Immature Granulocytes: 0 %
Lymphocytes Relative: 29 %
Lymphs Abs: 2.7 10*3/uL (ref 0.7–4.0)
MCH: 29.2 pg (ref 26.0–34.0)
MCHC: 34.3 g/dL (ref 30.0–36.0)
MCV: 85.1 fL (ref 80.0–100.0)
Monocytes Absolute: 0.6 10*3/uL (ref 0.1–1.0)
Monocytes Relative: 6 %
Neutro Abs: 5.8 10*3/uL (ref 1.7–7.7)
Neutrophils Relative %: 63 %
Platelets: 274 10*3/uL (ref 150–400)
RBC: 5.52 MIL/uL — ABNORMAL HIGH (ref 3.87–5.11)
RDW: 13.3 % (ref 11.5–15.5)
WBC: 9.3 10*3/uL (ref 4.0–10.5)
nRBC: 0 % (ref 0.0–0.2)

## 2023-11-09 LAB — LIPASE, BLOOD: Lipase: 39 U/L (ref 11–51)

## 2023-11-09 MED ORDER — LIDOCAINE VISCOUS HCL 2 % MT SOLN: 15.0000 mL | Freq: Four times a day (QID) | OROMUCOSAL | 1 refills | Status: DC | PRN

## 2023-11-09 NOTE — Progress Notes (Signed)
 Referring Provider: Lenn Quint, NP Primary Care Physician:  Lenn Quint, NP Primary GI Physician: Dr. Mordechai April  Chief Complaint  Patient presents with   Abdominal Pain    Having severe abdominal pain and burning    HPI:   Marie Diaz is a 58 y.o. female with history of chronic abdominal pain, weight loss with multiple prior ED presentations for abdominal pain with largely negative evaluations. EGD August 2022 with gastritis and pathology with reactive gastropathy, negative for H. pylori. CTA abdomen/pelvis with patent mesenteric vasculature in September 2022. 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 Previously evaluated by psychology during hospital admission and felt that she likely had psychosomatic component to her abdominal pain symptoms and started on low-dose dicyclomine  and amitriptyline . Later reported Bentyl  not helpful. Stopped amitriptyline  on her own, unclear if this medication had any effect on her symptoms.   CT in March 2024 while in the ER that showed subtle stranding adjacent to the ascending colon, possibly related to infectious/inflammatory colitis. She was prescribed a 10-day course of Cipro  and Flagyl though she did not have any typical of symptoms of colitis.   EGD 04/21/2023: Regular Z-line, esophageal biopsies taken, gastritis biopsied, normal examined duodenum. Pathology with focal mild chronic inactive gastritis, no H. pylori. Esophageal biopsies benign. Recommended PPI BID and NSAID avoidance.   Colonoscopy 04/21/2023: Inadequate prep, mild inflammation in the ileum secondary to ileitis biopsied, patchy mild inflammation in the transverse colon, sigmoid colon secondary to colitis biopsied.  Suspected poor prep may be secondary to patient eating the day prior and recommended repeat colonoscopy in 3-6 months.   Pathology with active ileitis and focal mild active colitis in the transverse colon with no  chronicity, granuloma, dysplasia, or malignancy.  Stated this was nonspecific and could be seen in infection, medication effect, self-limiting colitis.  Early IBD not entirely excluded.  Recommended avoiding NSAIDs, if diarrhea, check GI path panel.  Unfortunately, we were unable to connect with patient.     MRI/MRCP 04/20/2023 with stable pancreatic cyst dating back to 2016.  Appeared to communicate with the main pancreatic duct.  MRI also showed moderate amount of stool in her colon recommended starting MiraLAX daily.  Last seen in the office 09/16/2023.  She continued with daily epigastric burning not affected by meals.  No associated nausea or vomiting.  No recent weight loss.  Patient quite bothered by her symptoms stating it was affecting her daily life, not able to continue to live like this.  She was taking omeprazole  40 mg twice daily, Carafate  4 times daily.  Denies typical heartburn.  Noticed some issues with the action of swallowing recently, but foods and liquids passing normally once swallowed.  Bowels moving every couple of days.  Recommended changing PPI from omeprazole  to Nexium  40 mg twice daily and if no improvement in 2 weeks, start Elavil  at bedtime.  Also recommended starting Linzess  72 mcg daily, refer to SLP for modified barium swallow, arrange colonoscopy, and follow-up with Dr. Mordechai April in 6 weeks on epigastric burning.  BPE not completed.   Colonoscopy 10/06/2023: - Nonbleeding internal hemorrhoids, diverticulosis in the sigmoid colon, moderate inflammation in the ileum secondary to ileitis biopsied.  Biopsies taken from the entire colon. -Biopsy showed ileal mucosa with hyperplastic and reactive changes, negative for activity, chronicity, granuloma, dysplasia, or malignancy.  Biopsies from the entire colon were normal. - Despite biopsies being reassuring, considering endoscopic appearance and patient's  symptoms, recommended course of budesonide .   Today:  Continues with constant  epigastric burning. Never goes away, but may wax and wane in severity.  No identifying aggravating or relieving factors aside from lying down and going to bed.  She has taken someone else's pain pill for this.  She has taken Tylenol  from time to time.  Denies NSAIDs.  Symptoms are not affected by meals or bowel movements.  Denies nausea or vomiting.  No heartburn symptoms.  She taking Nexium  40 mg twice daily, Carafate  4 times daily, and budesonide  9 mg daily with no improvement in symptoms.  Denies BRBPR or melena.  States she has continued eating normally despite abdominal pain.   Past Medical History:  Diagnosis Date   Anxiety    Depression    Gastric ulcer    Gastritis    Hypertension     Past Surgical History:  Procedure Laterality Date   ABDOMINAL HYSTERECTOMY     BIOPSY  08/29/2019   Procedure: BIOPSY;  Surgeon: Alyce Jubilee, MD;  Location: [facility];  Service: Endoscopy;;   BIOPSY  02/26/2021   Procedure: BIOPSY;  Surgeon: Vinetta Greening, DO;  Location: [facility];  Service: Endoscopy;;   BIOPSY  04/21/2023   Procedure: BIOPSY;  Surgeon: Vinetta Greening, DO;  Location: [facility];  Service: Endoscopy;;   CHOLECYSTECTOMY     COLONOSCOPY N/A 10/06/2023   Surgeon: Vinetta Greening, DO; nonbleeding internal hemorrhoids, diverticulosis in sigmoid colon, moderate inflammation in the ileum secondary to ileitis.  Ileal biopsies with hyperplastic and reactive changes, negative for activity, chronicity, granuloma, dysplasia, malignancy.  Biopsies from entire colon were normal.   COLONOSCOPY WITH PROPOFOL  N/A 04/21/2023   Surgeon: Vinetta Greening, DO; inadequate prep, mild inflammation in the ileum secondary to ileitis biopsied, patchy mild inflammation of the transverse colon, sigmoid colon, secondary to colitis biopsy.  Pathology with active ileitis and focal mild active colitis in the transverse colon with no chronicity, granuloma, dysplasia, malignancy.    ESOPHAGOGASTRODUODENOSCOPY N/A 08/29/2019   normal esophagus, localized moderate inflammation with adherent blood, congestion, erosions, and friability on greater curvature of stomach. Biopsy with chronic gastritis, negative H.pylori.    ESOPHAGOGASTRODUODENOSCOPY (EGD) WITH PROPOFOL  N/A 02/26/2021   gastritis and pathology with reactive gastropathy and negative H.pylori   ESOPHAGOGASTRODUODENOSCOPY (EGD) WITH PROPOFOL  N/A 04/21/2023   Procedure: ESOPHAGOGASTRODUODENOSCOPY (EGD) WITH PROPOFOL ;  Surgeon: Vinetta Greening, DO;  Location: [facility];  Service: Endoscopy;  Laterality: N/A;   HEMORRHOID SURGERY      Current Outpatient Medications  Medication Sig Dispense Refill   budesonide  (ENTOCORT EC ) 3 MG 24 hr capsule Take 3 capsules (9 mg total) by mouth daily. 90 capsule 1   cloNIDine  (CATAPRES ) 0.2 MG tablet Take 0.2 mg by mouth 2 (two) times daily.     esomeprazole  (NEXIUM ) 40 MG capsule Take 1 capsule (40 mg total) by mouth 2 (two) times daily before a meal. 60 capsule 3   gabapentin (NEURONTIN) 300 MG capsule Take by mouth.     hydrOXYzine  (ATARAX ) 25 MG tablet SMARTSIG:1 Tablet(s) By Mouth 1-3 Times Daily PRN     lidocaine  (XYLOCAINE ) 2 % solution Use as directed 15 mLs in the mouth or throat every 6 (six) hours as needed for mouth pain (for epigastric burning). 300 mL 1   rosuvastatin (CRESTOR) 10 MG tablet Take 10 mg by mouth at bedtime.     sucralfate  (CARAFATE ) 1 g tablet Take 1 g by mouth 4 (four)  times daily.     linaclotide  (LINZESS ) 72 MCG capsule Take 1 capsule (72 mcg total) by mouth daily before breakfast. (Patient not taking: Reported on 11/09/2023) 30 capsule 3   losartan (COZAAR) 50 MG tablet Take 50 mg by mouth daily. (Patient not taking: Reported on 11/09/2023)     traZODone  (DESYREL ) 100 MG tablet Take 100 mg by mouth at bedtime. (Patient not taking: Reported on 11/09/2023)     No current facility-administered medications for this visit.    Allergies as of  11/09/2023 - Review Complete 11/09/2023  Allergen Reaction Noted   Tetracyclines & related Other (See Comments) 11/07/2015   Ultram [tramadol] Other (See Comments) 08/01/2012    Family History  Problem Relation Age of Onset   Colon cancer Neg Hx    Colon polyps Neg Hx     Social History   Socioeconomic History   Marital status: Widowed    Spouse name: Not on file   Number of children: Not on file   Years of education: Not on file   Highest education level: Not on file  Occupational History   Not on file  Tobacco Use   Smoking status: Every Day    Types: Cigarettes   Smokeless tobacco: Never  Vaping Use   Vaping status: Former  Substance and Sexual Activity   Alcohol use: No   Drug use: No   Sexual activity: Yes  Other Topics Concern   Not on file  Social History Narrative   [phone]), kids(2). 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 SHE'S AN ONLY CHILD.   Social Drivers of Corporate investment banker Strain: Low Risk  (06/08/2023)   Received from [facility]   Overall Financial Resource Strain (CARDIA)    Difficulty of Paying Living Expenses: Not very hard  Food Insecurity: No Food Insecurity (06/08/2023)   [facility]   Hunger Vital Sign    Worried About Running Out of Food in the Last Year: Never true    Ran Out of Food in the Last Year: Never true  Transportation Needs: No Transportation Needs (06/08/2023)   Received [facility]    Lack of Transportation (Medical): No    Lack of Transportation (Non-Medical): No  Physical Activity: Not on file  Stress: Not on file  Social Connections: Not on file    Review of Systems: Gen: Denies fever, chills, cold or flulike symptoms, presyncope, syncope. CV: Denies chest pain, palpitations. Resp: Denies dyspnea, cough.  GI: See HPI Heme: See HPI  Physical Exam: BP (!) 152/101 (BP Location: Right Arm, Patient  Position: Sitting, Cuff Size: Normal)   Pulse 88   Temp 98.1 F (36.7 C) (Oral)   Ht 5\' 2"  (1.575 m)   Wt 112 lb 9.6 oz (51.1 kg)   SpO2 99%   BMI 20.59 kg/m  General:   Alert and oriented. No distress noted. Pleasant and cooperative.  Head:  Normocephalic and atraumatic. Eyes:  Conjuctiva clear without scleral icterus. Heart:  S1, S2 present without murmurs appreciated. Lungs:  Clear to auscultation bilaterally. No wheezes, rales, or rhonchi. No distress.  Abdomen:  +BS, soft, and non-distended. Mild to moderate TTP in epigastric area. No rebound or guarding. No HSM or masses noted. Msk:  Symmetrical without gross deformities. Normal posture. Extremities:  Without edema. Neurologic:  Alert and  oriented x4 Psych:  Normal mood and affect.   EKG  09/10/23 in Care Everywhere:  Narrative:  Normal sinus rhythm  Nonspecific ST abnormality  Prolonged QT (QTC was 482) Abnormal ECG  When compared with ECG of 08-Jan-2023 20:47,  ST depression has replaced ST elevation in Inferior leads  ST no longer elevated in Lateral leads  Confirmed by Edrick Gowda ([id number]) on 09/14/2023 11:54:15 PM     Assessment:  58 year old female with history of anxiety, depression, HTN, chronic epigastric burning, ileitis on colonoscopy without diagnostic features on biopsy, presenting today with chief complaint of ongoing epigastric burning.  Epigastric burning: Chronic.  S/p extensive evaluation over the last few years that is been largely unrevealing.  She has had EGD x 2 with last in October 2024 with mild gastritis, biopsies negative for H. pylori.  She has known pancreatic tail lesion, but this has remained stable and small in size as of most recent MRI/MRCP in October 2024.  No biliary ductal abnormalities and has had prior cholecystectomy. Multiple CTs including CTA A/P with patent mesenteric vasculature in September 2022.  CT in March 2024 did show possible colitis involving the ascending colon.   Colonoscopy in October 2024 incomplete due to adequate prep, but showed nonspecific ileitis and mild colitis in the transverse and sigmoid colon.  Repeat colonoscopy April 2025 again with moderate ileitis but biopsies unrevealing.  She was started on budesonide  9 mg daily, but this has not changed her epigastric burning and I am not sure that ileal findings are related to her chronic epigastric burning.  We have tried changing PPIs from omeprazole  to Nexium  40 mg twice daily and also have her on Carafate  4 times daily, but she is had no improvement.  Notably, she denies any worsening symptoms postprandially, nausea, vomiting.  No diarrhea to suggest underlying IBD/symptoms from ileitis.  At this point, query functional dyspepsia.  Considered starting Elavil , but due to EKG in March (care everywhere) noting prolonged QT, will need to repeat EKG prior to considering Elavil  and close monitoring thereafter if Elavil  is started. Ultimately I will need to discuss with Dr. Mordechai April to see if he has other recommendations when he returns next week. Will provide viscous lidocaine  for now to see if this provides any additional improvement and also update labs to ensure no acute changes.    Ileitis:  Etiology unclear. No symptoms such as diarrhea, brbpr, melena, RLQ pain. Only with chronic epigastric burning as per above.  Currently on budesonide  9 mg daily.  Recommended continuing this for now.   Plan:  EKG CBC, CMP, lipase Viscous lidocaine  15 mL every 6 hours as needed for epigastric burning Continue Nexium  40 mg twice daily Okay to stop Carafate  as this is not helping. Continue budesonide  9 mg daily. Consider starting Elavil  if EKG looks okay. Will discuss with Dr. Mordechai April when he returns next week. Recommended ER evaluation if worsening symptoms.   Shana Daring, PA-C [facility] 11/09/2023

## 2023-11-09 NOTE — Patient Instructions (Addendum)
 Due to prolonged QT, I am not able to start Elavil  as this can cause fatal heart arrhythmias if your QT get too long.   Start viscous lidocaine  15 ml every 6 hours as needed for upper abdominal burning.   Continue Nexium  40 mg twice daily.   Continue budesonide  9 mg daily.   I will discuss your case with Dr. Mordechai April when he returns.   If you have any worsening symptoms or feel like the pain is too severe, you will need to go to the ER.    Shana Daring, PA-C [facility]

## 2023-11-10 ENCOUNTER — Ambulatory Visit (HOSPITAL_COMMUNITY): Payer: MEDICAID

## 2023-11-10 ENCOUNTER — Ambulatory Visit: Payer: Self-pay | Admitting: *Deleted

## 2023-11-13 ENCOUNTER — Encounter (HOSPITAL_COMMUNITY): Payer: MEDICAID

## 2023-12-09 ENCOUNTER — Ambulatory Visit: Payer: MEDICAID | Admitting: Internal Medicine

## 2023-12-16 ENCOUNTER — Other Ambulatory Visit: Payer: Self-pay | Admitting: Internal Medicine

## 2023-12-17 NOTE — Telephone Encounter (Signed)
 I am sending in a limited supply of budesonide  for her until she has follow-up with Dr. Mordechai April to discuss this further. It is important she keep her scheduled follow-up on 7/3.

## 2023-12-31 ENCOUNTER — Encounter: Payer: Self-pay | Admitting: Internal Medicine

## 2023-12-31 ENCOUNTER — Ambulatory Visit: Payer: MEDICAID | Admitting: Internal Medicine

## 2023-12-31 VITALS — BP 132/84 | HR 89 | Temp 99.2°F | Ht 62.0 in | Wt 114.0 lb

## 2023-12-31 DIAGNOSIS — K219 Gastro-esophageal reflux disease without esophagitis: Secondary | ICD-10-CM

## 2023-12-31 DIAGNOSIS — K529 Noninfective gastroenteritis and colitis, unspecified: Secondary | ICD-10-CM

## 2023-12-31 DIAGNOSIS — R109 Unspecified abdominal pain: Secondary | ICD-10-CM

## 2023-12-31 DIAGNOSIS — K5900 Constipation, unspecified: Secondary | ICD-10-CM | POA: Diagnosis not present

## 2023-12-31 DIAGNOSIS — K862 Cyst of pancreas: Secondary | ICD-10-CM

## 2023-12-31 DIAGNOSIS — R1013 Epigastric pain: Secondary | ICD-10-CM

## 2023-12-31 MED ORDER — BACLOFEN 5 MG PO TABS: 5.0000 mg | ORAL_TABLET | Freq: Three times a day (TID) | ORAL | 5 refills | Status: AC

## 2023-12-31 NOTE — Patient Instructions (Signed)
 I am going to be samples of a medication called Voquezna to take instead of your generic Nexium .  If symptoms improve, then let me know and we can send in a prescription.  Will likely have to use a specialty pharmacy to get this approved.  I am also going to start you on baclofen 5 mg 3 times daily.  This medication may cause drowsiness so just be aware.  We will set you up for capsule endoscopy to further evaluate your small bowel.  It was very nice seeing you again today.  Dr. Cindie

## 2023-12-31 NOTE — Progress Notes (Signed)
 Referring Provider: Renato Dorothey HERO, NP Primary Care Physician:  Renato Dorothey HERO, NP Primary GI:  Dr. Cindie  Chief Complaint  Patient presents with   Follow-up    Follow up on abd burning. Pt states she has her days when her abd burns so bad. Pt has to get pain meds to sleep    HPI:   Marie Diaz is a 58 y.o. female who presents to clinic today for follow-up visit.  History of chronic abdominal pain, weight loss, with numerous prior ED presentations for abdominal pain, largely negative evaluations.  Prior GI workup:  EGD August 2022 with gastritis and pathology with reactive gastropathy, negative for H. pylori.   CTA abdomen/pelvis 9/22 with patent mesenteric vasculature. 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   Previously evaluated by psychology during hospital admission and felt that she likely had psychosomatic component to her abdominal pain symptoms and started on low-dose dicyclomine  and amitriptyline . Later reported Bentyl  not helpful. Stopped amitriptyline  on her own, unclear if this medication had any effect on her symptoms.   CT in March 2024 while in the ER that showed subtle stranding adjacent to the ascending colon, possibly related to infectious/inflammatory colitis. She was prescribed a 10-day course of Cipro  and Flagyl though she did not have any typical of symptoms of colitis.    EGD 04/21/2023: Regular Z-line, esophageal biopsies taken, gastritis biopsied, normal examined duodenum. Pathology with focal mild chronic inactive gastritis, no H. pylori. Esophageal biopsies benign. Recommended PPI BID and NSAID avoidance.    Colonoscopy 04/21/2023: Inadequate prep, mild inflammation in the ileum secondary to ileitis biopsied, patchy mild inflammation in the transverse colon, sigmoid colon secondary to colitis biopsied.  Suspected poor prep may be secondary to patient eating the day prior and recommended repeat  colonoscopy in 3-6 months.     Pathology with active ileitis and focal mild active colitis in the transverse colon with no chronicity, granuloma, dysplasia, or malignancy.  Stated this was nonspecific and could be seen in infection, medication effect, self-limiting colitis.  Early IBD not entirely excluded.  Recommended avoiding NSAIDs, if diarrhea, check GI path panel.  Unfortunately, we were unable to connect with patient.     MRI/MRCP 04/20/2023 with stable pancreatic cyst dating back to 2016. Appeared to communicate with the main pancreatic duct. MRI also showed moderate amount of stool in her colon recommended starting MiraLAX daily.   Repeat colonoscopy April 2025 again with moderate ileitis but biopsies unrevealing. She was started on budesonide  9 mg daily, no improvement in symptoms.   Today, continues to complain of about abdominal burning.  Not associated with meals.  Worse at night.  States it will double her over.  Occasional nausea.  Chronic constipation improved on Linzess  72 mcg, takes this as needed.  Past Medical History:  Diagnosis Date   Anxiety    Depression    Gastric ulcer    Gastritis    Hypertension     Past Surgical History:  Procedure Laterality Date   ABDOMINAL HYSTERECTOMY     BIOPSY  08/29/2019   Procedure: BIOPSY;  Surgeon: Harvey Margo CROME, MD;  Location: [facility];  Service: Endoscopy;;   BIOPSY  02/26/2021   Procedure: BIOPSY;  Surgeon: Cindie Carlin POUR, DO;  Location: [facility];  Service: Endoscopy;;   BIOPSY  04/21/2023   Procedure: BIOPSY;  Surgeon: Cindie Carlin POUR, DO;  Location: [facility];  Service:  Endoscopy;;   CHOLECYSTECTOMY     COLONOSCOPY N/A 10/06/2023   Surgeon: Cindie Carlin POUR, DO; nonbleeding internal hemorrhoids, diverticulosis in sigmoid colon, moderate inflammation in the ileum secondary to ileitis.  Ileal biopsies with hyperplastic and reactive changes, negative for activity, chronicity, granuloma, dysplasia,  malignancy.  Biopsies from entire colon were normal.   COLONOSCOPY WITH PROPOFOL  N/A 04/21/2023   Surgeon: Cindie Carlin POUR, DO; inadequate prep, mild inflammation in the ileum secondary to ileitis biopsied, patchy mild inflammation of the transverse colon, sigmoid colon, secondary to colitis biopsy.  Pathology with active ileitis and focal mild active colitis in the transverse colon with no chronicity, granuloma, dysplasia, malignancy.   ESOPHAGOGASTRODUODENOSCOPY N/A 08/29/2019   normal esophagus, localized moderate inflammation with adherent blood, congestion, erosions, and friability on greater curvature of stomach. Biopsy with chronic gastritis, negative H.pylori.    ESOPHAGOGASTRODUODENOSCOPY (EGD) WITH PROPOFOL  N/A 02/26/2021   gastritis and pathology with reactive gastropathy and negative H.pylori   ESOPHAGOGASTRODUODENOSCOPY (EGD) WITH PROPOFOL  N/A 04/21/2023   Procedure: ESOPHAGOGASTRODUODENOSCOPY (EGD) WITH PROPOFOL ;  Surgeon: Cindie Carlin POUR, DO;  Location: [facility];  Service: Endoscopy;  Laterality: N/A;   HEMORRHOID SURGERY      Current Outpatient Medications  Medication Sig Dispense Refill   cloNIDine  (CATAPRES ) 0.2 MG tablet Take 0.2 mg by mouth 2 (two) times daily.     esomeprazole  (NEXIUM ) 40 MG capsule Take 1 capsule (40 mg total) by mouth 2 (two) times daily before a meal. 60 capsule 3   gabapentin (NEURONTIN) 300 MG capsule Take by mouth.     lidocaine  (XYLOCAINE ) 2 % solution Use as directed 15 mLs in the mouth or throat every 6 (six) hours as needed for mouth pain (for epigastric burning). 300 mL 1   linaclotide  (LINZESS ) 72 MCG capsule Take 1 capsule (72 mcg total) by mouth daily before breakfast. (Patient taking differently: Take 1 capsule (72 mcg total) by mouth daily before breakfast.) 30 capsule 3   rosuvastatin (CRESTOR) 10 MG tablet Take 10 mg by mouth at bedtime.     sucralfate  (CARAFATE ) 1 g tablet Take 1 g by mouth 4 (four) times daily.     budesonide   (ENTOCORT EC ) 3 MG 24 hr capsule take 3 capsules (9 MILLIGRAM total) by mouth daily. 42 capsule 0   hydrOXYzine  (ATARAX ) 25 MG tablet SMARTSIG:1 Tablet(s) By Mouth 1-3 Times Daily PRN (Patient not taking: Reported on 12/31/2023)     losartan (COZAAR) 50 MG tablet Take 50 mg by mouth daily. (Patient not taking: Reported on 11/09/2023)     traZODone  (DESYREL ) 100 MG tablet Take 100 mg by mouth at bedtime. (Patient not taking: Reported on 12/31/2023)     No current facility-administered medications for this visit.    Allergies as of 12/31/2023 - Review Complete 12/31/2023  Allergen Reaction Noted   Tetracyclines & related Other (See Comments) 11/07/2015   Ultram [tramadol] Other (See Comments) 08/01/2012    Family History  Problem Relation Age of Onset   Colon cancer Neg Hx    Colon polyps Neg Hx     Social History   Socioeconomic History   Marital status: Widowed    Spouse name: Not on file   Number of children: Not on file   Years of education: Not on file   Highest education level: Not on file  Occupational History   Not on file  Tobacco Use   Smoking status: Every Day    Types: Cigarettes   Smokeless tobacco: Never  Vaping Use  Vaping status: Former  Substance and Sexual Activity   Alcohol use: No   Drug use: No   Sexual activity: Yes  Other Topics Concern   Not on file  Social History Narrative   [phone]), kids(2). 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 SHE'S AN ONLY CHILD.   Social Drivers of Corporate investment banker Strain: Low Risk  (06/08/2023)   Received from [facility]   Overall Financial Resource Strain (CARDIA)    Difficulty of Paying Living Expenses: Not very hard  Food Insecurity: No Food Insecurity (06/08/2023)   Received from [facility]   Hunger Vital Sign    Within the past 12 months, you worried that your food would run out before you got the money to buy more.: Never true    Within the past  12 months, the food you bought just didn't last and you didn't have money to get more.: Never true  Transportation Needs: No Transportation Needs (06/08/2023)   Received from [facility]    Lack of Transportation (Medical): No    Lack of Transportation (Non-Medical): No  Physical Activity: Not on file  Stress: Not on file  Social Connections: Not on file    Subjective: Review of Systems  Constitutional:  Negative for chills and fever.  HENT:  Negative for congestion and hearing loss.   Eyes:  Negative for blurred vision and double vision.  Respiratory:  Negative for cough and shortness of breath.   Cardiovascular:  Negative for chest pain and palpitations.  Gastrointestinal:  Positive for abdominal pain, constipation, heartburn and nausea. Negative for blood in stool, diarrhea, melena and vomiting.  Genitourinary:  Negative for dysuria and urgency.  Musculoskeletal:  Negative for joint pain and myalgias.  Skin:  Negative for itching and rash.  Neurological:  Negative for dizziness and headaches.  Psychiatric/Behavioral:  Negative for depression. The patient is not nervous/anxious.      Objective: BP 132/84   Pulse 89   Temp 99.2 F (37.3 C)   Ht 5' 2 (1.575 m)   Wt 114 lb (51.7 kg)   BMI 20.85 kg/m  Physical Exam Constitutional:      Appearance: Normal appearance.  HENT:     Head: Normocephalic and atraumatic.  Eyes:     Extraocular Movements: Extraocular movements intact.     Conjunctiva/sclera: Conjunctivae normal.  Cardiovascular:     Rate and Rhythm: Normal rate and regular rhythm.  Pulmonary:     Effort: Pulmonary effort is normal.     Breath sounds: Normal breath sounds.  Abdominal:     General: Bowel sounds are normal.     Palpations: Abdomen is soft.  Musculoskeletal:        General: No swelling. Normal range of motion.     Cervical back: Normal range of motion and neck supple.  Skin:    General: Skin is warm and dry.      Coloration: Skin is not jaundiced.  Neurological:     General: No focal deficit present.     Mental Status: She is alert and oriented to person, place, and time.  Psychiatric:        Mood and Affect: Mood normal.        Behavior: Behavior normal.      Assessment: *Abdominal burning *Chronic GERD *Ileitis/colitis *Constipation  Plan: Difficult patient to treat from a symptom profile.  Has trialed and failed omeprazole ,  esomeprazole , Carafate , viscous lidocaine , Elavil , budesonide .  Stop esomeprazole , samples of Voquezna provided to patient today.  Call with update.  Will add on baclofen 5 mg 3 times daily for refractory acid reflux.  Discussed possibility of BuSpar, patient states she took this years ago and it made her crazy.  Not amenable to trying this again.  Given her ileitis seen on prior colonoscopy, will schedule for small bowel capsule endoscopy to further evaluate.  Follow-up in 6 weeks   12/31/2023 2:44 PM   Disclaimer: This note was dictated with voice recognition software. Similar sounding words can inadvertently be transcribed and may not be corrected upon review.

## 2024-01-05 ENCOUNTER — Telehealth: Payer: Self-pay

## 2024-01-05 ENCOUNTER — Encounter: Payer: Self-pay | Admitting: *Deleted

## 2024-01-05 NOTE — Telephone Encounter (Signed)
 Pt phoned wanting a formal Rx for Voquenza 20mg  sent to her pharmacy. ([facility] in [place])

## 2024-01-06 ENCOUNTER — Ambulatory Visit: Payer: Self-pay | Admitting: Nurse Practitioner

## 2024-01-08 ENCOUNTER — Telehealth: Payer: Self-pay | Admitting: *Deleted

## 2024-01-08 ENCOUNTER — Encounter: Payer: Self-pay | Admitting: *Deleted

## 2024-01-08 NOTE — Telephone Encounter (Signed)
 Pt has been rescheduled for capsule endoscopy from 01/11/24 until 01/22/24 due to insurance reasons. Updated instructions mailed.

## 2024-01-15 NOTE — Telephone Encounter (Signed)
 [facility] for capsule endoscopy: Pending ref# [id number]

## 2024-01-22 ENCOUNTER — Encounter (HOSPITAL_COMMUNITY): Admission: RE | Payer: Self-pay | Source: Home / Self Care

## 2024-01-22 ENCOUNTER — Other Ambulatory Visit: Payer: Self-pay | Admitting: Internal Medicine

## 2024-01-22 ENCOUNTER — Encounter: Payer: Self-pay | Admitting: *Deleted

## 2024-01-22 ENCOUNTER — Ambulatory Visit (HOSPITAL_COMMUNITY): Admission: RE | Admit: 2024-01-22 | Payer: MEDICAID | Source: Home / Self Care | Admitting: Internal Medicine

## 2024-01-22 ENCOUNTER — Telehealth: Payer: Self-pay | Admitting: *Deleted

## 2024-01-22 SURGERY — IMAGING PROCEDURE, GI TRACT, INTRALUMINAL, VIA CAPSULE

## 2024-01-22 MED ORDER — VOQUEZNA 20 MG PO TABS: 20.0000 mg | ORAL_TABLET | Freq: Every day | ORAL | 11 refills | Status: AC

## 2024-01-22 NOTE — Telephone Encounter (Signed)
 [facility]: Approved, [id number], DOS: 01/11/24-02/08/24

## 2024-01-22 NOTE — Telephone Encounter (Signed)
 Marie JONELLE Edison, RN:  Marie Diaz 1965-08-15 [id number] did not show for her givens procedure

## 2024-01-22 NOTE — Telephone Encounter (Signed)
 Pt has been addded

## 2024-01-22 NOTE — Telephone Encounter (Signed)
 Can you please put her on my 8/20 wed schedule at 3:30 and 4:00

## 2024-01-22 NOTE — Telephone Encounter (Signed)
 Pt says she forgot and ate last night. She has been rescheduled to 02/15/24 for GIVENS.

## 2024-01-27 ENCOUNTER — Telehealth: Payer: Self-pay

## 2024-01-27 NOTE — Telephone Encounter (Signed)
 Filled out documentation from [facility] and gathered the notes. Just waiting for the pt to contact me regarding her insurance information.

## 2024-02-03 NOTE — Telephone Encounter (Signed)
 Phoned the pt and spoke with her advising I needed a copy of her insurance card for Voquenza. Everytime pt comes in she advises she forgot her insurance card. Pt was advised that the copy was needed and as soon as she gives it to me I can do what I need from my end. Pt asked can she fax it to me so she was given the fax number to her. Waiting on pt

## 2024-02-11 ENCOUNTER — Telehealth: Payer: Self-pay | Admitting: *Deleted

## 2024-02-11 NOTE — Telephone Encounter (Signed)
 [facility] for givens: Your authorization [id number] was submitted, pending

## 2024-02-15 ENCOUNTER — Encounter (HOSPITAL_COMMUNITY): Payer: Self-pay

## 2024-02-15 ENCOUNTER — Other Ambulatory Visit: Payer: Self-pay | Admitting: Gastroenterology

## 2024-02-15 ENCOUNTER — Ambulatory Visit (HOSPITAL_COMMUNITY): Admit: 2024-02-15 | Payer: MEDICAID | Admitting: Internal Medicine

## 2024-02-15 ENCOUNTER — Encounter (HOSPITAL_COMMUNITY): Admission: RE | Payer: Self-pay | Source: Home / Self Care

## 2024-02-15 ENCOUNTER — Ambulatory Visit (HOSPITAL_COMMUNITY): Admission: RE | Admit: 2024-02-15 | Payer: MEDICAID | Source: Home / Self Care | Admitting: Internal Medicine

## 2024-02-15 DIAGNOSIS — R1013 Epigastric pain: Secondary | ICD-10-CM

## 2024-02-15 SURGERY — IMAGING PROCEDURE, GI TRACT, INTRALUMINAL, VIA CAPSULE

## 2024-02-15 NOTE — Telephone Encounter (Signed)
 Per Anitra at endo: pt did not show up for given

## 2024-02-16 ENCOUNTER — Telehealth: Payer: Self-pay | Admitting: Internal Medicine

## 2024-02-16 NOTE — Telephone Encounter (Signed)
 Patient called in asking for a refill on Lidocaine .

## 2024-02-17 ENCOUNTER — Other Ambulatory Visit: Payer: Self-pay

## 2024-02-17 DIAGNOSIS — R1013 Epigastric pain: Secondary | ICD-10-CM

## 2024-02-17 MED ORDER — LIDOCAINE VISCOUS HCL 2 % MT SOLN: 15.0000 mL | Freq: Four times a day (QID) | OROMUCOSAL | 1 refills | Status: AC | PRN

## 2024-02-17 NOTE — Telephone Encounter (Signed)
 Phoned the pt and LMOVM regarding her insurance that was needed to be sent with her Voquenza paperwork. Waiting on a response

## 2024-02-17 NOTE — Telephone Encounter (Signed)
 Pt's refill for Lidocaine  was sent to her pharmacy, pt made aware of this

## 2024-02-25 NOTE — Telephone Encounter (Signed)
 Pt states she has a lot of things going on right now and having issues with her teeth. She states she will call back to reschedule.

## 2024-04-28 ENCOUNTER — Other Ambulatory Visit: Payer: Self-pay | Admitting: Gastroenterology

## 2024-04-28 DIAGNOSIS — R1013 Epigastric pain: Secondary | ICD-10-CM

## 2024-08-17 ENCOUNTER — Ambulatory Visit: Payer: MEDICAID | Admitting: Dermatology
